# Patient Record
Sex: Male | Born: 1950
Health system: Southern US, Community
[De-identification: ages and names within clinical notes are randomized; demographics above are authoritative.]

## PROBLEM LIST (undated history)

## (undated) DIAGNOSIS — N200 Calculus of kidney: Secondary | ICD-10-CM

## (undated) DIAGNOSIS — J986 Disorders of diaphragm: Secondary | ICD-10-CM

## (undated) DIAGNOSIS — M1711 Unilateral primary osteoarthritis, right knee: Secondary | ICD-10-CM

## (undated) DIAGNOSIS — Z923 Personal history of irradiation: Secondary | ICD-10-CM

## (undated) DIAGNOSIS — M109 Gout, unspecified: Secondary | ICD-10-CM

## (undated) DIAGNOSIS — K219 Gastro-esophageal reflux disease without esophagitis: Secondary | ICD-10-CM

## (undated) DIAGNOSIS — M1990 Unspecified osteoarthritis, unspecified site: Secondary | ICD-10-CM

## (undated) DIAGNOSIS — E7800 Pure hypercholesterolemia, unspecified: Secondary | ICD-10-CM

## (undated) DIAGNOSIS — G4730 Sleep apnea, unspecified: Secondary | ICD-10-CM

## (undated) DIAGNOSIS — C801 Malignant (primary) neoplasm, unspecified: Secondary | ICD-10-CM

## (undated) DIAGNOSIS — I10 Essential (primary) hypertension: Secondary | ICD-10-CM

## (undated) HISTORY — PX: LARYNGECTOMY: SUR815

## (undated) HISTORY — PX: HERNIA REPAIR: SHX51

## (undated) HISTORY — PX: TONSILLECTOMY: SUR1361

## (undated) HISTORY — PX: UVULOPALATOPHARYNGOPLASTY (UPPP)/TONSILLECTOMY/SEPTOPLASTY: SHX6164

## (undated) HISTORY — PX: KIDNEY STONE SURGERY: SHX686

---

## 2002-12-06 ENCOUNTER — Emergency Department (HOSPITAL_COMMUNITY): Admission: EM | Admit: 2002-12-06 | Discharge: 2002-12-06 | Payer: Self-pay

## 2009-08-02 ENCOUNTER — Emergency Department (HOSPITAL_BASED_OUTPATIENT_CLINIC_OR_DEPARTMENT_OTHER): Admission: EM | Admit: 2009-08-02 | Discharge: 2009-08-03 | Payer: Self-pay | Admitting: Emergency Medicine

## 2009-08-02 ENCOUNTER — Ambulatory Visit: Payer: Self-pay | Admitting: Diagnostic Radiology

## 2009-10-26 ENCOUNTER — Encounter: Admission: RE | Admit: 2009-10-26 | Discharge: 2009-10-26 | Payer: Self-pay | Admitting: *Deleted

## 2009-11-02 ENCOUNTER — Ambulatory Visit (HOSPITAL_COMMUNITY): Admission: RE | Admit: 2009-11-02 | Discharge: 2009-11-02 | Payer: Self-pay | Admitting: Urology

## 2010-04-09 ENCOUNTER — Encounter: Admission: RE | Admit: 2010-04-09 | Discharge: 2010-04-09 | Payer: Self-pay | Admitting: Internal Medicine

## 2010-11-02 ENCOUNTER — Ambulatory Visit
Admission: RE | Admit: 2010-11-02 | Discharge: 2010-11-02 | Payer: Self-pay | Source: Home / Self Care | Attending: Urology | Admitting: Urology

## 2011-01-28 LAB — POCT I-STAT, CHEM 8
HCT: 50 % (ref 39.0–52.0)
Hemoglobin: 17 g/dL (ref 13.0–17.0)
Potassium: 3.9 mEq/L (ref 3.5–5.1)
Sodium: 139 mEq/L (ref 135–145)
TCO2: 26 mmol/L (ref 0–100)

## 2011-02-22 LAB — DIFFERENTIAL
Basophils Absolute: 0.1 10*3/uL (ref 0.0–0.1)
Basophils Relative: 1 % (ref 0–1)
Eosinophils Absolute: 0 K/uL (ref 0.0–0.7)
Eosinophils Relative: 0 % (ref 0–5)
Lymphocytes Relative: 11 % — ABNORMAL LOW (ref 12–46)
Lymphs Abs: 1.5 10*3/uL (ref 0.7–4.0)
Monocytes Absolute: 1.2 10*3/uL — ABNORMAL HIGH (ref 0.1–1.0)
Monocytes Relative: 9 % (ref 3–12)
Neutro Abs: 10.7 10*3/uL — ABNORMAL HIGH (ref 1.7–7.7)
Neutrophils Relative %: 78 % — ABNORMAL HIGH (ref 43–77)

## 2011-02-22 LAB — COMPREHENSIVE METABOLIC PANEL WITH GFR
ALT: 39 U/L (ref 0–53)
AST: 44 U/L — ABNORMAL HIGH (ref 0–37)
Alkaline Phosphatase: 69 U/L (ref 39–117)
CO2: 28 meq/L (ref 19–32)
GFR calc Af Amer: >60 mL/min (ref >60)
GFR calc non Af Amer: >60 mL/min (ref >60)
Glucose, Bld: 118 mg/dL — ABNORMAL HIGH (ref 70–99)
Potassium: 3.9 meq/L (ref 3.5–5.1)
Sodium: 142 meq/L (ref 135–145)
Total Protein: 8.4 g/dL — ABNORMAL HIGH (ref 6.0–8.3)

## 2011-02-22 LAB — URINALYSIS, ROUTINE W REFLEX MICROSCOPIC
Bilirubin Urine: NEGATIVE
Glucose, UA: NEGATIVE mg/dL
Ketones, ur: NEGATIVE mg/dL
Nitrite: NEGATIVE
Protein, ur: NEGATIVE mg/dL
Specific Gravity, Urine: 1.016 (ref 1.005–1.030)
Urobilinogen, UA: 0.2 mg/dL (ref 0.0–1.0)
pH: 6 (ref 5.0–8.0)

## 2011-02-22 LAB — URINE MICROSCOPIC-ADD ON

## 2011-02-22 LAB — URINE CULTURE
Colony Count: NO GROWTH
Culture: NO GROWTH

## 2011-02-22 LAB — CBC
HCT: 48.9 % (ref 39.0–52.0)
Hemoglobin: 16.6 g/dL (ref 13.0–17.0)
MCHC: 34 g/dL (ref 30.0–36.0)
MCV: 90.9 fL (ref 78.0–100.0)
Platelets: 230 10*3/uL (ref 150–400)
RBC: 5.37 MIL/uL (ref 4.22–5.81)
RDW: 12.6 % (ref 11.5–15.5)
WBC: 13.5 10*3/uL — ABNORMAL HIGH (ref 4.0–10.5)

## 2011-02-22 LAB — COMPREHENSIVE METABOLIC PANEL
Albumin: 4.7 g/dL (ref 3.5–5.2)
BUN: 21 mg/dL (ref 6–23)
Calcium: 10 mg/dL (ref 8.4–10.5)
Chloride: 102 mEq/L (ref 96–112)
Creatinine, Ser: 1.2 mg/dL (ref 0.4–1.5)
Total Bilirubin: 0.8 mg/dL (ref 0.3–1.2)

## 2011-02-22 LAB — LIPASE, BLOOD: Lipase: 173 U/L (ref 23–300)

## 2012-07-31 ENCOUNTER — Other Ambulatory Visit: Payer: Self-pay | Admitting: Neurosurgery

## 2012-07-31 DIAGNOSIS — M47817 Spondylosis without myelopathy or radiculopathy, lumbosacral region: Secondary | ICD-10-CM

## 2012-07-31 DIAGNOSIS — IMO0002 Reserved for concepts with insufficient information to code with codable children: Secondary | ICD-10-CM

## 2012-07-31 DIAGNOSIS — M5126 Other intervertebral disc displacement, lumbar region: Secondary | ICD-10-CM

## 2012-08-10 ENCOUNTER — Ambulatory Visit
Admission: RE | Admit: 2012-08-10 | Discharge: 2012-08-10 | Disposition: A | Discharge status: Home / Self Care | Payer: 59 | Source: Ambulatory Visit | Attending: Neurosurgery | Admitting: Neurosurgery

## 2012-08-10 DIAGNOSIS — M47817 Spondylosis without myelopathy or radiculopathy, lumbosacral region: Secondary | ICD-10-CM

## 2012-08-10 DIAGNOSIS — M5126 Other intervertebral disc displacement, lumbar region: Secondary | ICD-10-CM

## 2012-08-10 DIAGNOSIS — IMO0002 Reserved for concepts with insufficient information to code with codable children: Secondary | ICD-10-CM

## 2017-04-21 ENCOUNTER — Encounter (HOSPITAL_BASED_OUTPATIENT_CLINIC_OR_DEPARTMENT_OTHER)
Admission: RE | Admit: 2017-04-21 | Discharge: 2017-04-21 | Disposition: A | Discharge status: Home / Self Care | Payer: Medicare Other | Source: Ambulatory Visit | Attending: Otolaryngology | Admitting: Otolaryngology

## 2017-04-21 ENCOUNTER — Encounter (HOSPITAL_BASED_OUTPATIENT_CLINIC_OR_DEPARTMENT_OTHER): Payer: Self-pay | Admitting: *Deleted

## 2017-04-21 DIAGNOSIS — Z01812 Encounter for preprocedural laboratory examination: Secondary | ICD-10-CM | POA: Diagnosis present

## 2017-04-21 DIAGNOSIS — R001 Bradycardia, unspecified: Secondary | ICD-10-CM | POA: Insufficient documentation

## 2017-04-21 DIAGNOSIS — J383 Other diseases of vocal cords: Secondary | ICD-10-CM | POA: Insufficient documentation

## 2017-04-21 DIAGNOSIS — Z01810 Encounter for preprocedural cardiovascular examination: Secondary | ICD-10-CM | POA: Insufficient documentation

## 2017-04-21 DIAGNOSIS — G4733 Obstructive sleep apnea (adult) (pediatric): Secondary | ICD-10-CM | POA: Insufficient documentation

## 2017-04-21 DIAGNOSIS — I10 Essential (primary) hypertension: Secondary | ICD-10-CM | POA: Diagnosis not present

## 2017-04-28 ENCOUNTER — Ambulatory Visit (HOSPITAL_BASED_OUTPATIENT_CLINIC_OR_DEPARTMENT_OTHER): Payer: Medicare Other | Admitting: Anesthesiology

## 2017-04-28 ENCOUNTER — Encounter (HOSPITAL_BASED_OUTPATIENT_CLINIC_OR_DEPARTMENT_OTHER): Payer: Self-pay | Admitting: Anesthesiology

## 2017-04-28 ENCOUNTER — Encounter (HOSPITAL_BASED_OUTPATIENT_CLINIC_OR_DEPARTMENT_OTHER)
Admission: RE | Disposition: A | Discharge status: Home / Self Care | Payer: Self-pay | Source: Ambulatory Visit | Attending: Otolaryngology

## 2017-04-28 ENCOUNTER — Ambulatory Visit (HOSPITAL_BASED_OUTPATIENT_CLINIC_OR_DEPARTMENT_OTHER)
Admission: RE | Admit: 2017-04-28 | Discharge: 2017-04-28 | Disposition: A | Discharge status: Home / Self Care | Payer: Medicare Other | Source: Ambulatory Visit | Attending: Otolaryngology | Admitting: Otolaryngology

## 2017-04-28 DIAGNOSIS — I10 Essential (primary) hypertension: Secondary | ICD-10-CM | POA: Insufficient documentation

## 2017-04-28 DIAGNOSIS — Z87442 Personal history of urinary calculi: Secondary | ICD-10-CM | POA: Insufficient documentation

## 2017-04-28 DIAGNOSIS — Z9989 Dependence on other enabling machines and devices: Secondary | ICD-10-CM | POA: Insufficient documentation

## 2017-04-28 DIAGNOSIS — J383 Other diseases of vocal cords: Secondary | ICD-10-CM | POA: Diagnosis present

## 2017-04-28 DIAGNOSIS — K219 Gastro-esophageal reflux disease without esophagitis: Secondary | ICD-10-CM | POA: Insufficient documentation

## 2017-04-28 DIAGNOSIS — C320 Malignant neoplasm of glottis: Secondary | ICD-10-CM | POA: Insufficient documentation

## 2017-04-28 DIAGNOSIS — G4730 Sleep apnea, unspecified: Secondary | ICD-10-CM | POA: Insufficient documentation

## 2017-04-28 DIAGNOSIS — Z87891 Personal history of nicotine dependence: Secondary | ICD-10-CM | POA: Diagnosis not present

## 2017-04-28 DIAGNOSIS — E7800 Pure hypercholesterolemia, unspecified: Secondary | ICD-10-CM | POA: Insufficient documentation

## 2017-04-28 DIAGNOSIS — Z79899 Other long term (current) drug therapy: Secondary | ICD-10-CM | POA: Insufficient documentation

## 2017-04-28 DIAGNOSIS — M109 Gout, unspecified: Secondary | ICD-10-CM | POA: Insufficient documentation

## 2017-04-28 HISTORY — DX: Unspecified osteoarthritis, unspecified site: M19.90

## 2017-04-28 HISTORY — DX: Pure hypercholesterolemia, unspecified: E78.00

## 2017-04-28 HISTORY — DX: Essential (primary) hypertension: I10

## 2017-04-28 HISTORY — DX: Gout, unspecified: M10.9

## 2017-04-28 HISTORY — DX: Gastro-esophageal reflux disease without esophagitis: K21.9

## 2017-04-28 HISTORY — DX: Calculus of kidney: N20.0

## 2017-04-28 HISTORY — DX: Sleep apnea, unspecified: G47.30

## 2017-04-28 HISTORY — PX: MICROLARYNGOSCOPY WITH CO2 LASER AND EXCISION OF VOCAL CORD LESION: SHX5970

## 2017-04-28 SURGERY — MICROLARYNGOSCOPY WITH CO2 LASER AND EXCISION OF VOCAL CORD LESION
Anesthesia: General | Site: Throat | Laterality: Left

## 2017-04-28 MED ORDER — EPINEPHRINE PF 1 MG/ML IJ SOLN
INTRAMUSCULAR | Status: DC | PRN
  Administered 2017-04-28: 2 mL

## 2017-04-28 MED ORDER — ONDANSETRON HCL 4 MG/2ML IJ SOLN
INTRAMUSCULAR | Status: DC | PRN
  Administered 2017-04-28: 4 mg via INTRAVENOUS

## 2017-04-28 MED ORDER — FENTANYL CITRATE (PF) 100 MCG/2ML IJ SOLN
25.0000 ug | INTRAMUSCULAR | Status: DC | PRN
  Administered 2017-04-28: 50 ug via INTRAVENOUS

## 2017-04-28 MED ORDER — SUGAMMADEX SODIUM 200 MG/2ML IV SOLN
INTRAVENOUS | Status: DC | PRN
  Administered 2017-04-28: 200 mg via INTRAVENOUS

## 2017-04-28 MED ORDER — PROPOFOL 10 MG/ML IV BOLUS
INTRAVENOUS | Status: AC
  Filled 2017-04-28: qty 20

## 2017-04-28 MED ORDER — FENTANYL CITRATE (PF) 100 MCG/2ML IJ SOLN
INTRAMUSCULAR | Status: AC
Start: 2017-04-28 — End: 2017-04-28
  Filled 2017-04-28: qty 2

## 2017-04-28 MED ORDER — FENTANYL CITRATE (PF) 100 MCG/2ML IJ SOLN
50.0000 ug | INTRAMUSCULAR | Status: DC | PRN
  Administered 2017-04-28: 100 ug via INTRAVENOUS

## 2017-04-28 MED ORDER — LACTATED RINGERS IV SOLN
INTRAVENOUS | Status: DC
  Administered 2017-04-28 (×2): via INTRAVENOUS

## 2017-04-28 MED ORDER — OXYCODONE HCL 5 MG PO TABS: 5.0000 mg | ORAL_TABLET | Freq: Once | ORAL | Status: DC | PRN

## 2017-04-28 MED ORDER — PROPOFOL 10 MG/ML IV BOLUS
INTRAVENOUS | Status: DC | PRN
Start: 2017-04-28 — End: 2017-04-28
  Administered 2017-04-28: 180 mg via INTRAVENOUS
  Administered 2017-04-28: 20 mg via INTRAVENOUS

## 2017-04-28 MED ORDER — ONDANSETRON HCL 4 MG/2ML IJ SOLN: 4.0000 mg | Freq: Four times a day (QID) | INTRAMUSCULAR | Status: DC | PRN

## 2017-04-28 MED ORDER — LIDOCAINE HCL (CARDIAC) 20 MG/ML IV SOLN
INTRAVENOUS | Status: DC | PRN
  Administered 2017-04-28: 100 mg via INTRAVENOUS

## 2017-04-28 MED ORDER — MIDAZOLAM HCL 2 MG/2ML IJ SOLN
INTRAMUSCULAR | Status: AC
  Filled 2017-04-28: qty 2

## 2017-04-28 MED ORDER — FENTANYL CITRATE (PF) 100 MCG/2ML IJ SOLN
INTRAMUSCULAR | Status: AC
  Filled 2017-04-28: qty 2

## 2017-04-28 MED ORDER — MIDAZOLAM HCL 2 MG/2ML IJ SOLN
1.0000 mg | INTRAMUSCULAR | Status: DC | PRN
  Administered 2017-04-28: 2 mg via INTRAVENOUS

## 2017-04-28 MED ORDER — OXYCODONE HCL 5 MG/5ML PO SOLN: 5.0000 mg | Freq: Once | ORAL | Status: DC | PRN

## 2017-04-28 MED ORDER — SCOPOLAMINE 1 MG/3DAYS TD PT72: 1.0000 | MEDICATED_PATCH | Freq: Once | TRANSDERMAL | Status: DC | PRN

## 2017-04-28 MED ORDER — DEXAMETHASONE SODIUM PHOSPHATE 4 MG/ML IJ SOLN
INTRAMUSCULAR | Status: DC | PRN
  Administered 2017-04-28: 10 mg via INTRAVENOUS

## 2017-04-28 MED ORDER — ROCURONIUM BROMIDE 100 MG/10ML IV SOLN
INTRAVENOUS | Status: DC | PRN
  Administered 2017-04-28: 30 mg via INTRAVENOUS

## 2017-04-28 MED ORDER — LIDOCAINE 2% (20 MG/ML) 5 ML SYRINGE
INTRAMUSCULAR | Status: AC
  Filled 2017-04-28: qty 5

## 2017-04-28 SURGICAL SUPPLY — 26 items
CANISTER SUCT 1200ML W/VALVE (MISCELLANEOUS) ×2 IMPLANT
GAUZE SPONGE 4X4 12PLY STRL LF (GAUZE/BANDAGES/DRESSINGS) ×4 IMPLANT
GLOVE BIO SURGEON STRL SZ 6.5 (GLOVE) ×1 IMPLANT
GLOVE ECLIPSE 7.5 STRL STRAW (GLOVE) ×2 IMPLANT
GOWN STRL REUS W/ TWL LRG LVL3 (GOWN DISPOSABLE) IMPLANT
GOWN STRL REUS W/ TWL XL LVL3 (GOWN DISPOSABLE) IMPLANT
GOWN STRL REUS W/TWL LRG LVL3 (GOWN DISPOSABLE) ×2
GOWN STRL REUS W/TWL XL LVL3 (GOWN DISPOSABLE) ×2
GUARD TEETH (MISCELLANEOUS) IMPLANT
MARKER SKIN DUAL TIP RULER LAB (MISCELLANEOUS) IMPLANT
NDL HYPO 18GX1.5 BLUNT FILL (NEEDLE) ×1 IMPLANT
NDL SPNL 22GX7 QUINCKE BK (NEEDLE) IMPLANT
NEEDLE HYPO 18GX1.5 BLUNT FILL (NEEDLE) ×2 IMPLANT
NEEDLE SPNL 22GX7 QUINCKE BK (NEEDLE) IMPLANT
NS IRRIG 1000ML POUR BTL (IV SOLUTION) ×2 IMPLANT
PATTIES SURGICAL .5 X3 (DISPOSABLE) ×2 IMPLANT
REDUCTION FITTING 1/4 IN (FILTER) IMPLANT
SHEET MEDIUM DRAPE 40X70 STRL (DRAPES) ×2 IMPLANT
SLEEVE SCD COMPRESS KNEE MED (MISCELLANEOUS) IMPLANT
SOLUTION BUTLER CLEAR DIP (MISCELLANEOUS) ×2 IMPLANT
SURGILUBE 2OZ TUBE FLIPTOP (MISCELLANEOUS) IMPLANT
SYR 5ML LL (SYRINGE) ×2 IMPLANT
SYR CONTROL 10ML LL (SYRINGE) ×1 IMPLANT
SYR TB 1ML LL NO SAFETY (SYRINGE) IMPLANT
TOWEL OR 17X24 6PK STRL BLUE (TOWEL DISPOSABLE) ×2 IMPLANT
TUBE CONNECTING 20X1/4 (TUBING) ×3 IMPLANT

## 2017-04-28 NOTE — Transfer of Care (Signed)
Immediate Anesthesia Transfer of Care Note  Patient: Alexander Ramirez  Procedure(s) Performed: Procedure(s): MICROLARYNGOSCOPT WITH EXCISION LEFT VOCAL CORD MASS WITH CO2 LASER (Left)  Patient Location: PACU  Anesthesia Type:General  Level of Consciousness: awake, alert  and oriented  Airway & Oxygen Therapy: Patient Spontanous Breathing and Patient connected to face mask oxygen  Post-op Assessment: Report given to RN and Post -op Vital signs reviewed and stable  Post vital signs: Reviewed and stable  Last Vitals:  Vitals:   04/28/17 0911 04/28/17 1036  BP: 131/67   Pulse: 62 69  Resp: 18 17  Temp: 36.4 C (P) 36.7 C    Last Pain:  Vitals:   04/28/17 0911  TempSrc: Oral         Complications: No apparent anesthesia complications

## 2017-04-28 NOTE — H&P (Signed)
Alexander Ramirez is an 66 y.o. male.   Chief Complaint: hoarse HPI: several months hoarseness  Past Medical History:  Diagnosis Date  . Arthritis   . GERD (gastroesophageal reflux disease)    OTC meds  . Gout   . Hypercholesteremia   . Hypertension   . Kidney stones   . Sleep apnea    uses CPAP nightly    Past Surgical History:  Procedure Laterality Date  . HERNIA REPAIR    . KIDNEY STONE SURGERY    . TONSILLECTOMY    . UVULOPALATOPHARYNGOPLASTY (UPPP)/TONSILLECTOMY/SEPTOPLASTY     for sleep apnea    History reviewed. No pertinent family history. Social History:  reports that he has quit smoking. He has never used smokeless tobacco. He reports that he drinks alcohol. He reports that he does not use drugs.  Allergies: No Known Allergies  Medications Prior to Admission  Medication Sig Dispense Refill  . allopurinol (ZYLOPRIM) 300 MG tablet Take 300 mg by mouth 2 (two) times daily.    Marland Kitchen atorvastatin (LIPITOR) 40 MG tablet Take 40 mg by mouth daily.    . carvedilol (COREG) 6.25 MG tablet Take 6.25 mg by mouth 2 (two) times daily with a meal.    . meloxicam (MOBIC) 15 MG tablet Take 15 mg by mouth daily.    Marland Kitchen telmisartan (MICARDIS) 80 MG tablet Take 80 mg by mouth daily.      No results found for this or any previous visit (from the past 48 hour(s)). No results found.  ROS: otherwise negative  Height 5' 5.5" (1.664 m), weight 104.3 kg (230 lb).  PHYSICAL EXAM: Overall appearance:  Healthy appearing, in no distress, hoarse voice. Head:  Normocephalic, atraumatic. Ears: External auditory canals are clear; tympanic membranes are intact and the middle ears are free of any effusion. Nose: External nose is healthy in appearance. Internal nasal exam free of any lesions or obstruction. Oral Cavity/pharynx:  There are no mucosal lesions or masses identified. Hypopharynx/Larynx: Left cord lesion. Vocal cords move normally. Neuro:  No identifiable neurologic deficits. Neck: No  palpable neck masses.  Studies Reviewed: none    Assessment/Plan Vocal cord lesion. MicroDL with biopsy.  Alexander Ramirez 04/28/2017, 9:04 AM

## 2017-04-28 NOTE — Anesthesia Postprocedure Evaluation (Signed)
Anesthesia Post Note  Patient: Alexander Ramirez  Procedure(s) Performed: Procedure(s) (LRB): MICROLARYNGOSCOPT WITH EXCISION LEFT VOCAL CORD MASS WITH CO2 LASER (Left)     Patient location during evaluation: PACU Anesthesia Type: General Level of consciousness: awake and alert and patient cooperative Pain management: pain level controlled Vital Signs Assessment: post-procedure vital signs reviewed and stable Respiratory status: spontaneous breathing and respiratory function stable Cardiovascular status: stable Anesthetic complications: no    Last Vitals:  Vitals:   04/28/17 1100 04/28/17 1130  BP: 130/68 (!) 147/89  Pulse: (!) 56 (!) 58  Resp: 14 16  Temp:  36.7 C    Last Pain:  Vitals:   04/28/17 1130  TempSrc:   PainSc: Advance

## 2017-04-28 NOTE — Anesthesia Procedure Notes (Signed)
Procedure Name: Intubation Date/Time: 04/28/2017 9:45 AM Performed by: Maryella Shivers Pre-anesthesia Checklist: Patient identified, Emergency Drugs available, Suction available and Patient being monitored Patient Re-evaluated:Patient Re-evaluated prior to inductionOxygen Delivery Method: Circle system utilized Preoxygenation: Pre-oxygenation with 100% oxygen Intubation Type: IV induction Ventilation: Mask ventilation without difficulty Laryngoscope Size: Mac and 3 Grade View: Grade I Tube type: Oral Laser Tube: Laser Tube and Cuffed inflated with minimal occlusive pressure - saline Tube size: 6.0 (laser) mm Number of attempts: 1 Airway Equipment and Method: Stylet and Oral airway Placement Confirmation: ETT inserted through vocal cords under direct vision,  positive ETCO2 and breath sounds checked- equal and bilateral Tube secured with: Tape Dental Injury: Teeth and Oropharynx as per pre-operative assessment

## 2017-04-28 NOTE — Anesthesia Preprocedure Evaluation (Signed)
Anesthesia Evaluation  Patient identified by MRN, date of birth, ID band Patient awake    Reviewed: Allergy & Precautions, H&P , NPO status , Patient's Chart, lab work & pertinent test results  Airway Mallampati: II   Neck ROM: full    Dental   Pulmonary sleep apnea and Continuous Positive Airway Pressure Ventilation , former smoker,    breath sounds clear to auscultation       Cardiovascular hypertension,  Rhythm:regular Rate:Normal     Neuro/Psych    GI/Hepatic GERD  ,  Endo/Other    Renal/GU stones     Musculoskeletal  (+) Arthritis ,   Abdominal   Peds  Hematology   Anesthesia Other Findings   Reproductive/Obstetrics                             Anesthesia Physical Anesthesia Plan  ASA: II  Anesthesia Plan: General   Post-op Pain Management:    Induction: Intravenous  PONV Risk Score and Plan: 3 and Ondansetron, Dexamethasone, Propofol and Midazolam  Airway Management Planned: Oral ETT  Additional Equipment:   Intra-op Plan:   Post-operative Plan: Extubation in OR  Informed Consent: I have reviewed the patients History and Physical, chart, labs and discussed the procedure including the risks, benefits and alternatives for the proposed anesthesia with the patient or authorized representative who has indicated his/her understanding and acceptance.     Plan Discussed with: CRNA, Anesthesiologist and Surgeon  Anesthesia Plan Comments:         Anesthesia Quick Evaluation

## 2017-04-28 NOTE — Op Note (Signed)
OPERATIVE REPORT  DATE OF SURGERY: 04/28/2017  PATIENT:  Alexander Ramirez,  66 y.o. male  PRE-OPERATIVE DIAGNOSIS:  LEFT VOCAL CORD MASS  POST-OPERATIVE DIAGNOSIS:  LEFT VOCAL CORD MASS  PROCEDURE:  Procedure(s): MICROLARYNGOSCOPT WITH EXCISION LEFT VOCAL CORD MASS WITH CO2 LASER  SURGEON:  Beckie Salts, MD  ASSISTANTS: none  ANESTHESIA:   General   EBL:  3 ml  DRAINS: none  LOCAL MEDICATIONS USED:  None  SPECIMEN:  Left vocal cord mass  COUNTS:  Correct  PROCEDURE DETAILS: The patient was taken to the operating room and placed on the operating table in the supine position. Following induction of general endotracheal anesthesia using a laser safe endotracheal tube, the table was turned 90 and the patient was draped in a standard fashion including saline soaked eye pads and facial towels for laser protection. The Jako laryngoscope was entered into the oral cavity used to visualize the larynx and attached the Mayo stand with the suspension apparatus. The left vocal cord lesion was identified. Photograph was taken. The CO2 laser was attached to the operating microscope. The laser was used at a setting of 4 W continuous power and the lesion was excised in its entirety. Some residual abnormal mucosa was vaporized with the laser. The specimen was sent for pathologic evaluation. Topical adrenaline was used on pledgets for hemostasis. A maxillary tooth protector was used throughout the case. The scope was removed. The patient was awakened extubated and transferred to recovery in stable condition.    PATIENT DISPOSITION:  To PACU, stable

## 2017-04-28 NOTE — Discharge Instructions (Signed)
We will call you with results of the biopsy later in the week when available.  Post Anesthesia Home Care Instructions  Activity: Get plenty of rest for the remainder of the day. A responsible individual must stay with you for 24 hours following the procedure.  For the next 24 hours, DO NOT: -Drive a car -Paediatric nurse -Drink alcoholic beverages -Take any medication unless instructed by your physician -Make any legal decisions or sign important papers.  Meals: Start with liquid foods such as gelatin or soup. Progress to regular foods as tolerated. Avoid greasy, spicy, heavy foods. If nausea and/or vomiting occur, drink only clear liquids until the nausea and/or vomiting subsides. Call your physician if vomiting continues.  Special Instructions/Symptoms: Your throat may feel dry or sore from the anesthesia or the breathing tube placed in your throat during surgery. If this causes discomfort, gargle with warm salt water. The discomfort should disappear within 24 hours.  If you had a scopolamine patch placed behind your ear for the management of post- operative nausea and/or vomiting:  1. The medication in the patch is effective for 72 hours, after which it should be removed.  Wrap patch in a tissue and discard in the trash. Wash hands thoroughly with soap and water. 2. You may remove the patch earlier than 72 hours if you experience unpleasant side effects which may include dry mouth, dizziness or visual disturbances. 3. Avoid touching the patch. Wash your hands with soap and water after contact with the patch.

## 2017-04-29 ENCOUNTER — Encounter (HOSPITAL_BASED_OUTPATIENT_CLINIC_OR_DEPARTMENT_OTHER): Payer: Self-pay | Admitting: Otolaryngology

## 2017-05-01 ENCOUNTER — Encounter (HOSPITAL_BASED_OUTPATIENT_CLINIC_OR_DEPARTMENT_OTHER): Payer: Self-pay | Admitting: Otolaryngology

## 2017-05-01 ENCOUNTER — Encounter: Payer: Self-pay | Admitting: Radiation Oncology

## 2017-05-02 NOTE — Progress Notes (Signed)
error 

## 2017-05-05 ENCOUNTER — Ambulatory Visit
Admission: RE | Admit: 2017-05-05 | Discharge: 2017-05-05 | Disposition: A | Discharge status: Home / Self Care | Payer: Medicare Other | Source: Ambulatory Visit | Attending: Radiation Oncology | Admitting: Radiation Oncology

## 2017-05-05 ENCOUNTER — Ambulatory Visit: Admission: RE | Admit: 2017-05-05 | Payer: Medicare Other | Source: Ambulatory Visit

## 2017-05-05 DIAGNOSIS — E7800 Pure hypercholesterolemia, unspecified: Secondary | ICD-10-CM | POA: Insufficient documentation

## 2017-05-05 DIAGNOSIS — K219 Gastro-esophageal reflux disease without esophagitis: Secondary | ICD-10-CM | POA: Insufficient documentation

## 2017-05-05 DIAGNOSIS — Z801 Family history of malignant neoplasm of trachea, bronchus and lung: Secondary | ICD-10-CM | POA: Insufficient documentation

## 2017-05-05 DIAGNOSIS — M109 Gout, unspecified: Secondary | ICD-10-CM | POA: Insufficient documentation

## 2017-05-05 DIAGNOSIS — Z79899 Other long term (current) drug therapy: Secondary | ICD-10-CM | POA: Insufficient documentation

## 2017-05-05 DIAGNOSIS — Z87442 Personal history of urinary calculi: Secondary | ICD-10-CM | POA: Insufficient documentation

## 2017-05-05 DIAGNOSIS — I10 Essential (primary) hypertension: Secondary | ICD-10-CM | POA: Insufficient documentation

## 2017-05-05 DIAGNOSIS — M1990 Unspecified osteoarthritis, unspecified site: Secondary | ICD-10-CM | POA: Insufficient documentation

## 2017-05-05 DIAGNOSIS — C320 Malignant neoplasm of glottis: Secondary | ICD-10-CM | POA: Insufficient documentation

## 2017-05-05 DIAGNOSIS — G4730 Sleep apnea, unspecified: Secondary | ICD-10-CM | POA: Insufficient documentation

## 2017-05-05 DIAGNOSIS — Z87891 Personal history of nicotine dependence: Secondary | ICD-10-CM | POA: Insufficient documentation

## 2017-05-05 DIAGNOSIS — Z510 Encounter for antineoplastic radiation therapy: Secondary | ICD-10-CM | POA: Insufficient documentation

## 2017-05-05 NOTE — Progress Notes (Signed)
Head and Neck Cancer Location of Tumor / Histology:  04/28/17 Diagnosis Vocal cord, biopsy, Left - SQUAMOUS CELL CARCINOMA  Patient presented with symptoms of: He reports several months of voice hoarseness.   Biopsies of Left vocal cord revealed: Squamous Cell Carcinoma  Nutrition Status Yes No Comments  Weight changes? []  [x]    Swallowing concerns? []  [x]    PEG? []  [x]     Referrals Yes No Comments  Social Work? []  [x]    Dentistry? []  [x]  Last visit 5 years,no ins  Swallowing therapy? []  [x]    Nutrition? []  [x]    Med/Onc? []  [x]     Safety Issues Yes No Comments  Prior radiation? []  [x]    Pacemaker/ICD? []  [x]    Possible current pregnancy? []  [x]    Is the patient on methotrexate? []  [x]     Tobacco/Marijuana/Snuff/ETOH use: He is a former smoker, no smokeless tobacco use. He drinks alcohol socially.   Past/Anticipated interventions by otolaryngology, if any:  04/28/17 PROCEDURE:  Procedure(s): MICROLARYNGOSCOPT WITH EXCISION LEFT VOCAL CORD MASS WITH CO2 LASER  SURGEON:  Beckie Salts, MD, 05/16/17  Follow up  Past/Anticipated interventions by medical oncology, if any: No   Current Complaints / other details:  Hoarseness, Married,  Father lung cancer, smoker, died age 74,  BP 136/81   Pulse (!) 57   Temp 98.2 F (36.8 C) (Oral)   Resp 18   Ht 5' 5.5" (1.664 m)   Wt 233 lb 9.6 oz (106 kg)   SpO2 99% Comment: room air  BMI 38.28 kg/m   Wt Readings from Last 3 Encounters:  05/06/17 233 lb 9.6 oz (106 kg)  04/28/17 233 lb (105.7 kg)

## 2017-05-06 ENCOUNTER — Ambulatory Visit
Admission: RE | Admit: 2017-05-06 | Discharge: 2017-05-06 | Disposition: A | Discharge status: Home / Self Care | Payer: Medicare Other | Source: Ambulatory Visit | Attending: Radiation Oncology | Admitting: Radiation Oncology

## 2017-05-06 ENCOUNTER — Encounter: Payer: Self-pay | Admitting: Radiation Oncology

## 2017-05-06 VITALS — BP 136/81 | HR 57 | Temp 98.2°F | Resp 18 | Ht 65.5 in | Wt 233.6 lb

## 2017-05-06 DIAGNOSIS — C320 Malignant neoplasm of glottis: Secondary | ICD-10-CM

## 2017-05-06 DIAGNOSIS — Z801 Family history of malignant neoplasm of trachea, bronchus and lung: Secondary | ICD-10-CM | POA: Diagnosis not present

## 2017-05-06 DIAGNOSIS — Z87442 Personal history of urinary calculi: Secondary | ICD-10-CM | POA: Diagnosis not present

## 2017-05-06 DIAGNOSIS — Z79899 Other long term (current) drug therapy: Secondary | ICD-10-CM | POA: Diagnosis not present

## 2017-05-06 DIAGNOSIS — M1990 Unspecified osteoarthritis, unspecified site: Secondary | ICD-10-CM | POA: Diagnosis not present

## 2017-05-06 DIAGNOSIS — E7800 Pure hypercholesterolemia, unspecified: Secondary | ICD-10-CM | POA: Diagnosis not present

## 2017-05-06 DIAGNOSIS — K219 Gastro-esophageal reflux disease without esophagitis: Secondary | ICD-10-CM | POA: Diagnosis not present

## 2017-05-06 DIAGNOSIS — Z87891 Personal history of nicotine dependence: Secondary | ICD-10-CM | POA: Diagnosis not present

## 2017-05-06 DIAGNOSIS — M109 Gout, unspecified: Secondary | ICD-10-CM | POA: Diagnosis not present

## 2017-05-06 DIAGNOSIS — Z510 Encounter for antineoplastic radiation therapy: Secondary | ICD-10-CM | POA: Diagnosis not present

## 2017-05-06 DIAGNOSIS — I10 Essential (primary) hypertension: Secondary | ICD-10-CM | POA: Diagnosis not present

## 2017-05-06 DIAGNOSIS — G4730 Sleep apnea, unspecified: Secondary | ICD-10-CM | POA: Diagnosis not present

## 2017-05-06 MED ORDER — LARYNGOSCOPY SOLUTION RAD-ONC
15.0000 mL | Freq: Once | TOPICAL | Status: AC
  Administered 2017-05-06: 15 mL via TOPICAL
  Filled 2017-05-06: qty 15

## 2017-05-06 NOTE — Progress Notes (Signed)
Please see the Nurse Progress Note in the MD Initial Consult Encounter for this patient. 

## 2017-05-06 NOTE — Progress Notes (Addendum)
Radiation Oncology         [phone] ________________________________  Initial Outpatient Consultation  Name: Alexander Ramirez MRN: [id number]  Date: 05/06/2017  DOB: 01-02-1951  WG:[id number], [name]  Izora Gala, MD   REFERRING PHYSICIAN : Izora Gala, MD  DIAGNOSIS: Cancer Staging Malignant neoplasm of glottis [facility]) Staging form: Larynx - Glottis, AJCC 8th Edition - Clinical: Stage I (cT1a, cN0, cM0) - Signed by Eppie Gibson, MD on 05/07/2017     ICD-10-CM   1. 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 History of tobacco abuse Z87.891 CT CHEST LUNG CA SCREEN LOW DOSE W/O CM  3. Malignant neoplasm of glottis (HCC) C32.0     CHIEF COMPLAINT: Here to discuss management of squamous cell carcinoma of the vocal cords  HISTORY OF PRESENT ILLNESS::Alexander Ramirez is a 66 y.o. male who initially presented with a several month history of hoarseness. He therefore saw Dr. Constance Holster, ENT. A left vocal cord lesion was identified on laryngoscopy. The patient then underwent microlaryngoscopy with excision of the left vocal cord mass on 04/28/17 by Dr. Constance Holster. This revealed squamous cell carcinoma and the features in the biopsy were highly suspicious for invasion.  The patient reports to the clinic today, with his wife, to discuss the role that radiation therapy may play in the treatment of his disease.  On review of systems:  He reports arthritic joint pain in his hands, knees, and back.  Of note, the patient is a former smoker of 40 years who smoked two-thirds of a pack per day (at least 30 pack year history). He quit in approximately 2010. He reports mostly cigar use but inhaled. He denies history of smokeless tobacco use. He drinks alcohol socially. 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 His last visit with the dentist  was over 5 years ago.  PREVIOUS RADIATION THERAPY: No  PAST MEDICAL HISTORY:  has a past medical history of Arthritis; GERD (gastroesophageal reflux disease); Gout; Hypercholesteremia; Hypertension; Kidney stones; and Sleep apnea.    PAST SURGICAL HISTORY: Past Surgical History:  Procedure Laterality Date  . HERNIA REPAIR    . KIDNEY STONE SURGERY    . MICROLARYNGOSCOPY WITH CO2 LASER AND EXCISION OF VOCAL CORD LESION Left 04/28/2017   Procedure: MICROLARYNGOSCOPY WITH EXCISION LEFT VOCAL CORD MASS WITH CO2 LASER;  Surgeon: Izora Gala, MD;  Location: [facility];  Service: ENT;  Laterality: Left;  . TONSILLECTOMY    . UVULOPALATOPHARYNGOPLASTY (UPPP)/TONSILLECTOMY/SEPTOPLASTY     for sleep apnea    FAMILY HISTORY: family history includes Lung cancer in his father.  SOCIAL HISTORY:  reports that he has quit smoking. He has never used smokeless tobacco. He reports that he drinks alcohol. He reports that he does not use drugs.  ALLERGIES: Patient has no known allergies.  MEDICATIONS:  Current Outpatient Prescriptions  Medication Sig Dispense Refill  . allopurinol (ZYLOPRIM) 300 MG tablet Take 300 mg by mouth 2 (two) times daily.    Marland Kitchen atorvastatin (LIPITOR) 40 MG tablet Take 40 mg by mouth daily.    . carvedilol (COREG) 6.25 MG tablet Take 6.25 mg by mouth 2 (two) times daily with a meal.    . telmisartan (MICARDIS) 80 MG tablet Take 80 mg by mouth daily.    . meloxicam (MOBIC) 15 MG tablet Take  15 mg by mouth daily.     No current facility-administered medications for this encounter.     REVIEW OF SYSTEMS:  A 10+ POINT REVIEW OF SYSTEMS WAS OBTAINED including neurology, dermatology, psychiatry, cardiac, respiratory, lymph, extremities, GI, GU, Musculoskeletal, constitutional, HEENT.  All pertinent positives are noted in the HPI.  All others are negative.    PHYSICAL EXAM:  height is 5' 5.5" (1.664 m) and weight is 233 lb 9.6 oz (106 kg). His oral temperature is 98.2  F (36.8 C). His blood pressure is 136/81 and his pulse is 57 (abnormal). His respiration is 18 and oxygen saturation is 99%.   General: Alert and oriented, in no acute distress HEENT: Head is normocephalic. Extraocular movements are intact. Oropharynx is clear. He is missing his uvula. Hoarse  Neck: Neck is notable for no palpable mass in the supraclavicular or cervical areas. Heart: Regular in rate and rhythm with no murmurs, rubs, or gallops. Chest: Clear to auscultation bilaterally, with no rhonchi, wheezes, or rales. Abdomen: Soft, nontender, nondistended, with no rigidity or guarding. Extremities: No cyanosis or edema. Lymphatics: see Neck Exam Skin: No concerning lesions. Musculoskeletal: symmetric strength and muscle tone throughout. Neurologic: Cranial nerves II through XII are grossly intact. No obvious focalities. Speech is fluent. Coordination is intact. Psychiatric: Judgment and insight are intact. Affect is appropriate.  PROCEDURE NOTE: After obtaining consent and anesthetizing the nasal cavity with topical lidocaine and phenylephrine, the flexible endoscope was introduced and passed through the nasal cavity.  The true cords have a white patchy irregular surface which may be secondary to his recent laser excision. There is some asymmetry of the ventricular folds. The left ventricular fold appears more swollen and the left aryepiglottic fold appears to be possibly less mobile than the right. The movement of the true vocal cords is obscured by movement of the supraglottic structures.  ECOG = 0   LABORATORY DATA:  Lab Results  Component Value Date   WBC 13.5 (H) 08/02/2009   HGB 17.0 11/02/2010   HCT 50.0 11/02/2010   MCV 90.9 08/02/2009   PLT 230 08/02/2009   CMP     Component Value Date/Time   NA 139 11/02/2010 1104   K 3.9 11/02/2010 1104   CL 103 11/02/2010 1104   CO2 28 08/02/2009 2227   GLUCOSE 94 11/02/2010 1104   BUN 15 11/02/2010 1104   CREATININE 1.1  11/02/2010 1104   CALCIUM 10.0 08/02/2009 2227   PROT 8.4 (H) 08/02/2009 2227   ALBUMIN 4.7 08/02/2009 2227   AST 44 (H) 08/02/2009 2227   ALT 39 08/02/2009 2227   ALKPHOS 69 08/02/2009 2227   BILITOT 0.8 08/02/2009 2227   GFRNONAA >60 08/02/2009 2227   GFRAA  08/02/2009 2227    >60        The eGFR has been calculated using the MDRD equation. This calculation has not been validated in all clinical situations. eGFR's persistently <60 mL/min signify possible Chronic Kidney Disease.         RADIOGRAPHY: No results found.    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 I would recommend radiotherapy for this patient.  I spoke with Dr. Constance Holster prior to the patient's encounter. He feels that that he has possibly not removed all of the disease with laser excision and that the patient is at a high risk of recurrence if the patient is only observed. Dr. Constance Holster  is heavily leaning towards radiation for a curative treatment. I echoed this to the patient and also acknowledged that Dr. Constance Holster is willing to undergo close observation with the patient if he declines radiation. We will also discuss the patient's case as a team at tumor board tomorrow. At tumor board I will discuss the asymmetry of the supraglottis. There appears to be swelling of the left ventricular fold and possibly some decreased mobility of the aryepiglottic fold. I am not sure if Dr. Constance Holster appreciated this on a previous exam. Out of caution, I will ask Dr. Constance Holster to see the patient back in his office for a final laryngoscopy before we establish the patient's stage.  We discussed the potential risks, benefits, and side effects of radiotherapy. We talked in detail about acute and late effects. We discussed that some of the most bothersome acute effects may be mucositis, dysgeusia, salivary changes, skin irritation, hair loss, dehydration, weight  loss and fatigue. We talked about late effects which include but are not necessarily limited to dysphagia, hypothyroidism, nerve injury, laryngeal injury, feeding tube dependence. No guarantees of treatment were given. A consent form was signed and placed in the patient's medical record. The patient is enthusiastic about proceeding with treatment. I look forward to participating in the patient's care.    Simulation (treatment planning) will take place after he undergoes CT chest as above:  The patient underwent counseling and shared decision making concerning LDCT screening of the chest concerning lung cancer. He meets the criteria for this and is enthusiastic to proceed with imaging. He quit smoking 7-8 years ago and has at least 30 pack year history. The patient will have this performed at [facility].  We also discussed that the treatment of head and neck cancer is a multidisciplinary process to maximize treatment outcomes and quality of life. For this reasons the following referrals have been or will be made:  Nutritionist for nutrition support during and after treatment.  Speech language pathology for swallowing and/or speech therapy.  Social work for social support.   Physical therapy due to risk of lymphedema in neck and deconditioning.  Baseline labs including TSH.  ADDENDUM: Dr. Constance Holster and I spoke - the area of supraglottic swelling is adjacent to the area of laser excision. This is likely inflammation but Dr. Constance Holster will see the patient and assess soon. __________________________________________   Eppie Gibson, MD  This document serves as a record of services personally performed by Eppie Gibson, MD. It was created on her behalf by Darcus Austin, a trained medical scribe. The creation of this record is based on the scribe's personal observations and the provider's statements to them. This document has been checked and approved by the attending provider.

## 2017-05-07 ENCOUNTER — Telehealth: Payer: Self-pay | Admitting: *Deleted

## 2017-05-07 ENCOUNTER — Other Ambulatory Visit: Payer: Self-pay | Admitting: Radiation Oncology

## 2017-05-07 DIAGNOSIS — C320 Malignant neoplasm of glottis: Secondary | ICD-10-CM | POA: Insufficient documentation

## 2017-05-07 DIAGNOSIS — Z1329 Encounter for screening for other suspected endocrine disorder: Secondary | ICD-10-CM

## 2017-05-07 NOTE — Telephone Encounter (Signed)
CALLED PATIENT TO INFORM OF CT FOR 05-13-17- ARRIVAL TIME - 3:15 PM @ [facility], NO RESTRICTIONS TO TEST, LVM FOR A RETURN CALL

## 2017-05-08 ENCOUNTER — Encounter: Payer: Self-pay | Admitting: *Deleted

## 2017-05-08 ENCOUNTER — Telehealth: Payer: Self-pay | Admitting: *Deleted

## 2017-05-08 NOTE — Telephone Encounter (Signed)
Oncology Nurse Navigator Documentation  Placed introductory call to new referral patient.  Introduced myself as the H&N oncology nurse navigator that works with Dr. Isidore Moos with whom he met on Tuesday.   I briefly explained my role as his navigator, indicated that I would be joining him as he moves forward with treatment.   I discussed next Tuesday morning's H&N MDC, he indicated availability to attend.  I provided him an 0800 arrival to Radiation Waiting, explained registration procedure.   I provided my contact information, encouraged him to call with questions/concerns before next week.  He verbalized understanding of information provided, expressed appreciation for my call.  Gayleen Orem, RN, BSN, [place] [facility] Oncology Nurse [place] [facility] [place] [phone]

## 2017-05-09 NOTE — Progress Notes (Signed)
A user error has taken place: encounter opened in error, closed for administrative reasons.

## 2017-05-12 ENCOUNTER — Telehealth: Payer: Self-pay | Admitting: *Deleted

## 2017-05-12 NOTE — Telephone Encounter (Signed)
Oncology Nurse Navigator Documentation  LVMM on mobile phones for both Mr. and Mrs. Lampert reminding them of tomorrow morning's H&N [place] with an 0800 arrival to Radiation Waiting following lobby registration.  Gayleen Orem, RN, BSN, [place] [facility] Oncology Nurse [place] [facility] [place] [phone]

## 2017-05-13 ENCOUNTER — Ambulatory Visit: Payer: Medicare Other | Admitting: Nutrition

## 2017-05-13 ENCOUNTER — Encounter: Payer: Self-pay | Admitting: *Deleted

## 2017-05-13 ENCOUNTER — Ambulatory Visit (HOSPITAL_COMMUNITY)
Admission: RE | Admit: 2017-05-13 | Discharge: 2017-05-13 | Disposition: A | Discharge status: Home / Self Care | Payer: Medicare Other | Source: Ambulatory Visit | Attending: Radiation Oncology | Admitting: Radiation Oncology

## 2017-05-13 ENCOUNTER — Ambulatory Visit: Payer: Medicare Other | Attending: Radiation Oncology | Admitting: Physical Therapy

## 2017-05-13 ENCOUNTER — Ambulatory Visit
Admission: RE | Admit: 2017-05-13 | Discharge: 2017-05-13 | Disposition: A | Discharge status: Home / Self Care | Payer: Medicare Other | Source: Ambulatory Visit | Attending: Radiation Oncology | Admitting: Radiation Oncology

## 2017-05-13 VITALS — BP 126/79 | HR 56 | Temp 98.1°F | Wt 237.6 lb

## 2017-05-13 DIAGNOSIS — C320 Malignant neoplasm of glottis: Secondary | ICD-10-CM

## 2017-05-13 DIAGNOSIS — Z122 Encounter for screening for malignant neoplasm of respiratory organs: Secondary | ICD-10-CM | POA: Insufficient documentation

## 2017-05-13 DIAGNOSIS — K760 Fatty (change of) liver, not elsewhere classified: Secondary | ICD-10-CM | POA: Diagnosis not present

## 2017-05-13 DIAGNOSIS — I2510 Atherosclerotic heart disease of native coronary artery without angina pectoris: Secondary | ICD-10-CM | POA: Insufficient documentation

## 2017-05-13 DIAGNOSIS — R262 Difficulty in walking, not elsewhere classified: Secondary | ICD-10-CM | POA: Insufficient documentation

## 2017-05-13 DIAGNOSIS — J439 Emphysema, unspecified: Secondary | ICD-10-CM | POA: Diagnosis not present

## 2017-05-13 DIAGNOSIS — I700 Atherosclerosis of aorta: Secondary | ICD-10-CM | POA: Diagnosis not present

## 2017-05-13 DIAGNOSIS — R293 Abnormal posture: Secondary | ICD-10-CM | POA: Diagnosis not present

## 2017-05-13 DIAGNOSIS — M47814 Spondylosis without myelopathy or radiculopathy, thoracic region: Secondary | ICD-10-CM | POA: Diagnosis not present

## 2017-05-13 DIAGNOSIS — Z87891 Personal history of nicotine dependence: Secondary | ICD-10-CM | POA: Insufficient documentation

## 2017-05-13 NOTE — Therapy (Signed)
[place], [place], [address] Phone: [phone]   Fax:  [phone]  Physical Therapy Evaluation  Patient Details  Name: Alexander Ramirez MRN: [id number] Date of Birth: January 18, 1951 Referring Provider: Dr. Eppie Gibson  Encounter Date: 05/13/2017      PT End of Session - 05/13/17 1149    Visit Number 1   Number of Visits 1   PT Start Time 0920   PT Stop Time 0948   PT Time Calculation (min) 28 min   Activity Tolerance Patient tolerated treatment well   Behavior During Therapy [facility] for tasks assessed/performed      Past Medical History:  Diagnosis Date  . Arthritis   . GERD (gastroesophageal reflux disease)    OTC meds  . Gout   . Hypercholesteremia   . Hypertension   . Kidney stones   . Sleep apnea    uses CPAP nightly    Past Surgical History:  Procedure Laterality Date  . HERNIA REPAIR    . KIDNEY STONE SURGERY    . MICROLARYNGOSCOPY WITH CO2 LASER AND EXCISION OF VOCAL CORD LESION Left 04/28/2017   Procedure: MICROLARYNGOSCOPY WITH EXCISION LEFT VOCAL CORD MASS WITH CO2 LASER;  Surgeon: Izora Gala, MD;  Location: [facility];  Service: ENT;  Laterality: Left;  . TONSILLECTOMY    . UVULOPALATOPHARYNGOPLASTY (UPPP)/TONSILLECTOMY/SEPTOPLASTY     for sleep apnea    There were no vitals filed for this visit.       Subjective Assessment - 05/13/17 1100    Subjective Reports a bad back and two bad knees.  Has a prescription for therapy for his back and has had knee injections; back therapy on hold until after cancer treatment.  Orthopedist suggests total knee replacements for his knees.   Patient is accompained by: Family member  wife   Pertinent History From Dr. Pearlie Oyster note: "Alexander Ramirez is a 66 y.o. male who initially presented with a several month history of hoarseness. He therefore saw Dr. Constance Holster, ENT. A left vocal cord lesion was identified on laryngoscopy. The  patient then underwent microlaryngoscopy with excision of the left vocal cord mass on 04/28/17 by Dr. Constance Holster. This revealed squamous cell carcinoma and the features in the biopsy were highly suspicious for invasion" He is expected to start XRT 7/12 or 06/02/17. Will have chest CT today (05/13/17) to rule out metastases.   How long can you stand comfortably? 5-10 minutes   Patient Stated Goals get info from all head & neck clinic providers   Currently in Pain? Yes   Pain Score 3    Pain Location Back   Pain Orientation Right;Lower   Pain Descriptors / Indicators Aching   Pain Type Chronic pain   Pain Radiating Towards right leg to ankle   Pain Onset More than a month ago   Aggravating Factors  standing for more than a few minutes   Pain Relieving Factors steroid dosepak helped, but just temporarily            [facility] PT Assessment - 05/13/17 0001      Assessment   Medical Diagnosis left vocal cord squamous cell carcinoma   Referring Provider Dr. Eppie Gibson   Prior Therapy none  but has a referral for PT for back pain     Precautions   Precautions Other (comment)   Precaution Comments cancer precautions     Restrictions   Weight Bearing Restrictions No     Balance Screen  Has the patient fallen in the past 6 months Yes   How many times? 1  fluke-carrying 2 grocery bags, missed step   Has the patient had a decrease in activity level because of a fear of falling?  No   Is the patient reluctant to leave their home because of a fear of falling?  No     Home Ecologist residence   Living Arrangements Spouse/significant other   Type of [place] level     Prior Function   Level of Independence Independent   Leisure no regular exercise     Cognition   Overall Cognitive Status Within Functional Limits for tasks assessed     Observation/Other Assessments   Observations gentleman in no acute distress; voice is raspy      Coordination   Gross Motor Movements are Fluid and Coordinated Yes     Functional Tests   Functional tests Sit to Stand     Sit to Stand   Comments 14 times in 30 seconds, good for his age     Posture/Postural Control   Posture/Postural Control Postural limitations   Postural Limitations Forward head     ROM / Strength   AROM / PROM / Strength AROM     AROM   Overall AROM Comments neck and shoulder AROM both WFL     Ambulation/Gait   Ambulation/Gait Yes   Ambulation/Gait Assistance 6: Modified independent (Device/Increase time)   Assistive device None  but uses cart at grocery store to lean on   Gait Pattern Antalgic  left knee pain; significant limp   Ambulation Surface Level   Stairs --  reports he needs to use railing on stairs           LYMPHEDEMA/ONCOLOGY QUESTIONNAIRE - 05/13/17 1232      Type   Cancer Type left vocal cord squamous cell     Lymphedema Assessments   Lymphedema Assessments Head and Neck     Head and Neck   4 cm superior to sternal notch around neck 42.2 cm   6 cm superior to sternal notch around neck 43.4 cm   8 cm superior to sternal notch around neck 44.7 cm         Objective measurements completed on examination: See above findings.                  PT Education - 05/13/17 1148    Education provided Yes   Education Details neck AROM, posture, breathing, CURE article on staying active, options/alternatives for doing cardiovascular exercise including pool and arm bike, info on [facility] at the [employer], lymphedema and PT info   Person(s) Educated Patient;Spouse   Methods Explanation;Handout   Comprehension Verbalized understanding                 Head and Neck Clinic Goals - 05/13/17 1154      Patient will be able to verbalize understanding of a home exercise program for cervical range of motion, posture, and walking.    Status Achieved  but alternatives to walking were suggested     Patient will  be able to verbalize understanding of proper sitting and standing posture.    Status Achieved     Patient will be able to verbalize understanding of lymphedema risk and availability of treatment for this condition.    Status Achieved            Plan -  05/13/17 1149    Clinical Impression Statement This is a pleasant gentleman with diagnosis of left vocal cord squamous cell carcinoma who will undergo XRT treatment starting in mid-July.  He has back pain and bilateral knee pain with a significant antalgic limp and needs to use a railing on stairs. He has forward head posture.   History and Personal Factors relevant to plan of care: chronic knee and back problems   Clinical Presentation Evolving   Clinical Presentation due to: will start radiation treatment soon   Clinical Decision Making Moderate   Rehab Potential Good   PT Frequency One time visit   PT Treatment/Interventions Patient/family education   PT Next Visit Plan None at this time.  He may need therapy going forward should lymphedema develop.  He has a prescription for therapy for his back and has knee problems as well, but these things are on hold for him until after his cancer treatment.   PT Home Exercise Plan try to come up with a pool exercise program or other exercise program that won't irritate his knees and back, neck ROM; [facility]   Consulted and Agree with Plan of Care Patient      Patient will benefit from skilled therapeutic intervention in order to improve the following deficits and impairments:  Postural dysfunction, Pain, Abnormal gait  Visit Diagnosis: Abnormal posture - Plan: PT plan of care cert/re-cert  Malignant neoplasm of left vocal cord ([place]) - Plan: PT plan of care cert/re-cert  Difficulty in walking, not elsewhere classified - Plan: PT plan of care cert/re-cert      G-Codes - 44/62/86 1155    Functional Assessment Tool Used (Outpatient Only)  clinical judgement   Functional Limitation Mobility: Walking and moving around   Mobility: Walking and Moving Around Current Status (N8177) At least 40 percent but less than 60 percent impaired, limited or restricted   Mobility: Walking and Moving Around Goal Status [phone]) At least 40 percent but less than 60 percent impaired, limited or restricted   Mobility: Walking and Moving Around Discharge Status [phone]) At least 40 percent but less than 60 percent impaired, limited or restricted       Problem List Patient Active Problem List   Diagnosis Date Noted  . Malignant neoplasm of glottis ([place]) 05/07/2017    Deone Leifheit 05/13/2017, 12:33 PM  [place], [place], [address] Phone: [phone]   Fax:  [phone]  Name: Haddon Fyfe MRN: [id number] Date of Birth: 1951/03/08  Serafina Royals, PT 05/13/17 12:33 PM

## 2017-05-13 NOTE — Progress Notes (Signed)
Oncology Nurse Navigator Documentation  Met with Mr. Costales during [facility].  He was accompanied by his wife.  Provided verbal and written overview of [place], the clinicians who will be seeing him, encouraged him to ask questions during his time with them.  He was seen by Nutrition, PT, SW and [place].  He has SLP appt scheduled for 05/19/17.  Spoke with him at end of [facility], addressed questions. They understand I can be contacted with needs/concerns.  Gayleen Orem, RN, BSN, [place] at [place] [phone]

## 2017-05-13 NOTE — Progress Notes (Signed)
Head & Neck Multidisciplinary Clinic Clinical Social Work  Clinical Social Work met with patient/family at head & neck multidisciplinary clinic to offer support and assess for psychosocial needs.  Patient was accompanied by his spouse.  He has 6 children (all live within an hour) and 15 grandchildren.  Mr. 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  Mr. Moss discussed his plan to continue working through treatment.  CSW discussed strategies for managing to remain active while also resting as needed based on treatment side effects.  Mr. Burlingame expressed situational anxiety surrounding the uncertainty related to treatment and it's side effects.  Clinical Social Work briefly discussed Clinical Social Work role and [employer] support programs/services.  Clinical Social Work encouraged patient to call with any additional questions or concerns.   Maryjean Morn, MSW, LCSW, OSW-C Clinical Social Worker [facility] [phone]

## 2017-05-13 NOTE — Progress Notes (Signed)
Patient was seen during head and neck clinic.  Patient is a 66 year old male diagnosed with glottis cancer to receive radiation therapy.  Past medical history includes tobacco, arthritis, GERD, gout, hypercholesterolemia, hypertension, kidney stones, and social alcohol usage.  Medications include Lipitor.  Labs were reviewed.  Height: 65.5 inches. Weight: 237.6 pounds Usual body weight: 235 pounds. BMI: 38.94.  Patient denies nutrition impact symptoms.  Nutrition diagnosis:  Food and nutrition related knowledge deficit related to new diagnosis of Glottis cancer as evidenced by no prior need for nutrition related information.  Intervention: Patient was educated to consume high-calorie, high-protein foods in small frequent meals and snacks. Reviewed importance of increased fluids. Reviewed potential side effects and provided nutrition interventions. Provided fact sheets and answered questions.  Teach back method was used and contact information given.  Monitoring, evaluation, goals: Patient will tolerate adequate calories and protein to minimize weight loss throughout treatment to promote healing.  Next visit: To be scheduled.  **Disclaimer: This note was dictated with voice recognition software. Similar sounding words can inadvertently be transcribed and this note may contain transcription errors which may not have been corrected upon publication of note.**

## 2017-05-14 ENCOUNTER — Telehealth: Payer: Self-pay | Admitting: *Deleted

## 2017-05-14 NOTE — Telephone Encounter (Signed)
Oncology Nurse Navigator Documentation  Called patient wife in follow-up to her VMM this morning. Per Dr. Isidore Moos, informed her of favorable results for yesterday's CT Chest. She voiced appreciation for information. Confirmed name of Mr. Packard PCP, entered into Care Team. Confirmed their understanding of Friday's 0900 CT SIM, requested 0845 arrival to Radiation Waiting following lobby registration.  Gayleen Orem, RN, BSN, [place]ck Oncology Nurse [place] at [place] [phone]

## 2017-05-16 ENCOUNTER — Encounter: Payer: Self-pay | Admitting: *Deleted

## 2017-05-16 ENCOUNTER — Ambulatory Visit
Admission: RE | Admit: 2017-05-16 | Discharge: 2017-05-16 | Disposition: A | Discharge status: Home / Self Care | Payer: Medicare Other | Source: Ambulatory Visit | Attending: Radiation Oncology | Admitting: Radiation Oncology

## 2017-05-16 DIAGNOSIS — C320 Malignant neoplasm of glottis: Secondary | ICD-10-CM

## 2017-05-16 DIAGNOSIS — Z510 Encounter for antineoplastic radiation therapy: Secondary | ICD-10-CM | POA: Diagnosis not present

## 2017-05-16 NOTE — Progress Notes (Signed)
Oncology Nurse Navigator Documentation  To provide support, encouragement and care continuity, met with Mr. Alexander Ramirez during his CT SIM.  He was accompanied by his wife. He tolerated procedure without difficulty, denied questions/concerns. I showed them LINAC 2 treatment area, explained arrival and treatment preparation procedures. They confirmed understanding of 7/2 1530 SLP at [facility]. They understand to call me with questions/concerns prior to 7/9 RT start.  Gayleen Orem, RN, BSN, [place] Neck Oncology Nurse [place] at [place] [phone]

## 2017-05-17 NOTE — Progress Notes (Signed)
  Radiation Oncology         [phone]) [phone] ________________________________  Name: Alexander Ramirez MRN: [id number]  Date: 05/16/2017  DOB: 09-07-1951  SIMULATION AND TREATMENT PLANNING NOTE  Outpatient    ICD-10-CM   1. Malignant neoplasm of glottis ([place]) C32.0     NARRATIVE:  The patient was brought to the [place].  Identity was confirmed.  All relevant records and images related to the planned course of therapy were reviewed.  The patient freely provided informed written consent to proceed with treatment after reviewing the details related to the planned course of therapy. The consent form was witnessed and verified by the simulation staff.    Then, the patient was set-up in a stable reproducible supine position for radiation therapy.  Aquaplast head and should mask was custom fabricated for immobilization.  CT images were obtained without contrast.  Surface markings were placed.  The CT images were loaded into the planning software.    TREATMENT PLANNING NOTE: Treatment planning then occurred.  The radiation prescription was entered and confirmed.    A total of 3 medically necessary complex treatment devices were fabricated and supervised by me (2 wedges for the opposed fields and the Aquaplast head and shoulder mask). I have requested : 3D Simulation  I have requested a DVH of the following structures: target volume, pharynx, esophagus, spinal cord.  I have ordered:Nutrition Consult  The patient will receive 63 Gy in 28 fractions to the larynx with 2 fields.  -----------------------------------  Eppie Gibson, MD

## 2017-05-19 ENCOUNTER — Ambulatory Visit: Payer: Medicare Other | Attending: Radiation Oncology

## 2017-05-19 DIAGNOSIS — Z510 Encounter for antineoplastic radiation therapy: Secondary | ICD-10-CM | POA: Diagnosis not present

## 2017-05-19 DIAGNOSIS — R1310 Dysphagia, unspecified: Secondary | ICD-10-CM | POA: Diagnosis not present

## 2017-05-19 NOTE — Patient Instructions (Signed)
SWALLOWING EXERCISES Do these as prescribed until 6 months after your last day of radiation, then 2 times per week afterwards  1. Effortful Swallows - Press your tongue against the roof of your mouth for 3 seconds, then squeeze          the muscles in your neck while you swallow your saliva or a sip of water - Repeat 20 times, 2-3 times a day, and use whenever you eat or drink  2. Pitch Raise - Repeat "he", once per second in as high of a pitch as you can - Repeat 20 times, 2-3 times a day  3. Mendelsohn Maneuver - "half swallow" exercise - Start to swallow, and keep your Adam's apple up by squeezing hard with the            muscles of the throat - Hold the squeeze for 5-7 seconds and then relax - Repeat 20 times, 2-3 times a day *use a wet spoon if your mouth gets dry*  4. Breath Hold - Say "HUH!" loudly, then hold your breath for 3 seconds at your voice box - Repeat 20 times, 2-3 times a day  5. Chin pushback - Open your mouth  - Place your fist UNDER your chin near your neck, and push back with your fist for 5 seconds - Repeat 10 times, 2-3 times a day        6.  "Siren" exercise             - Start at a low note and sing up to a high note, then back down again to a low note             - Repeat 10 times

## 2017-05-19 NOTE — Therapy (Addendum)
[facility] [address] [place], [place], [address] Phone: [phone]   Fax:  [phone]  Speech Language Pathology Evaluation  Patient Details  Name: Alexander Ramirez MRN: [id number] Date of Birth: [birth year] Referring Provider: Eppie Gibson, MD  Encounter Date: 05/19/2017      End of Session - 05/19/17 1634    Visit Number 1   Number of Visits 3   Date for SLP Re-Evaluation 08/08/17   SLP Start Time 1520   SLP Stop Time  1275   SLP Time Calculation (min) 45 min   Activity Tolerance Patient tolerated treatment well      Past Medical History:  Diagnosis Date  . Arthritis   . GERD (gastroesophageal reflux disease)    OTC meds  . Gout   . Hypercholesteremia   . Hypertension   . Kidney stones   . Sleep apnea    uses CPAP nightly    Past Surgical History:  Procedure Laterality Date  . HERNIA REPAIR    . KIDNEY STONE SURGERY    . MICROLARYNGOSCOPY WITH CO2 LASER AND EXCISION OF VOCAL CORD LESION Left 04/28/2017   Procedure: MICROLARYNGOSCOPY WITH EXCISION LEFT VOCAL CORD MASS WITH CO2 LASER;  Surgeon: Izora Gala, MD;  Location: [facility];  Service: ENT;  Laterality: Left;  . TONSILLECTOMY    . UVULOPALATOPHARYNGOPLASTY (UPPP)/TONSILLECTOMY/SEPTOPLASTY     for sleep apnea    There were no vitals filed for this visit.      Subjective Assessment - 05/19/17 1517    Subjective Pt endorses difficulty with meats, tougher foods. Has had esophageal dilation in past. Notably mod hoarse voice today.   Patient is accompained by: Family member  Almyra Free, wife   Currently in Pain? Yes   Pain Score 3    Pain Location Back   Pain Orientation Right;Lower   Pain Descriptors / Indicators Aching   Pain Type Chronic pain   Pain Onset More than a month ago   Aggravating Factors  standing   Pain Relieving Factors meds            SLP Evaluation [facility] - 05/19/17 1517      SLP Visit Information    SLP Received On 05/19/17   Referring Provider Eppie Gibson, MD   Onset Date Four months ago   Medical Diagnosis Glottic cancer     General Information   HPI Has had esophageal dilation approx 15 years ago. Reports diffculty with dense meats (steak, pork). Rad begins 05-26-17, pt reports approx 6 weeks of rad tx planned.      Prior Functional Status   Cognitive/Linguistic Baseline Within functional limits   Vocation Self employed     Cognition   Overall Cognitive Status Within Functional Limits for tasks assessed     Auditory Comprehension   Overall Auditory Comprehension Appears within functional limits for tasks assessed     Verbal Expression   Overall Verbal Expression Appears within functional limits for tasks assessed     Oral Motor/Sensory Function   Overall Oral Motor/Sensory Function Appears within functional limits for tasks assessed     Motor Speech   Overall Motor Speech Appears within functional limits for tasks assessed   Phonation Hoarse  mod hoarse; sustained /a/=10 seconds (below WNL)                         SLP Education - 05/19/17 1633    Education provided Yes  Education Details HEP,  late effects head/neck radiation   Person(s) Educated Patient;Spouse   Methods Explanation;Demonstration;Verbal cues;Handout   Comprehension Verbalized understanding;Returned demonstration;Verbal cues required;Need further instruction          SLP Short Term Goals - June 18, 2017 1636      SLP SHORT TERM GOAL #1   Title pt will complete HEP with rare min A    Time 1   Period --  visit   Status New     SLP SHORT TERM GOAL #2   Title pt will tell SLP why he is completing HEP   Time 1   Period --  visit   Status New          SLP Long Term Goals - 2017-06-18 1637      SLP LONG TERM GOAL #1   Title pt will complete HEP with modified independecne   Time 2   Period --  visits   Status New     SLP LONG TERM GOAL #2   Title pt will tell SLP 3  overt s/s aspiration PNA with modified independence   Time 2   Period --  visits   Status New     SLP LONG TERM GOAL #3   Title pt will tell SLP why a food journal is helpful in returning to most liberal diet   Time 2   Period --  visits   Status New          Plan - 18-Jun-2017 1634    Clinical Impression Statement Pt with oropharyngeal swallowing essentially WFL, however reports occasional difficulty wiht pharyngeal clearance with dense meats. His probability of swallowing difficulty increases dramatically with the initiation of radiation therapy. Pt will need to be followed by SLP for regular assessment of accurate HEP completion as well as for safety with POs both during and following treatment/s.   Speech Therapy Frequency --  approx once every 4 weeks   Duration --  two therapy visits   Treatment/Interventions Pharyngeal strengthening exercises;Aspiration precaution training;Diet toleration management by SLP;Compensatory techniques;Internal/external aids;SLP instruction and feedback;Patient/family education;Oral motor exercises;Trials of upgraded texture/liquids  any or all may be used   Potential to Achieve Goals Good   SLP Home Exercise Plan provided today   Consulted and Agree with Plan of Care Patient      Patient will benefit from skilled therapeutic intervention in order to improve the following deficits and impairments:   Dysphagia, unspecified type      G-Codes - 2017-06-18 1638    Functional Assessment Tool Used clinical judgement, pt report   Functional Limitations Swallowing   Swallow Current Status (X1062) At least 1 percent but less than 20 percent impaired, limited or restricted   Swallow Goal Status (I9485) At least 1 percent but less than 20 percent impaired, limited or restricted      Problem List Patient Active Problem List   Diagnosis Date Noted  . Malignant neoplasm of glottis ([place]) 05/07/2017    [facility], [place]  06/18/2017, 4:39  PM  [facility] [address] [place], [place], [address] Phone: [phone]   Fax:  [phone]  Name: Alexander Ramirez MRN: [id number] Date of Birth: 04/24/1951

## 2017-05-26 ENCOUNTER — Ambulatory Visit
Admission: RE | Admit: 2017-05-26 | Discharge: 2017-05-26 | Disposition: A | Discharge status: Home / Self Care | Payer: Medicare Other | Source: Ambulatory Visit | Attending: Radiation Oncology | Admitting: Radiation Oncology

## 2017-05-26 ENCOUNTER — Other Ambulatory Visit: Payer: Self-pay | Admitting: Radiation Oncology

## 2017-05-26 DIAGNOSIS — Z510 Encounter for antineoplastic radiation therapy: Secondary | ICD-10-CM | POA: Diagnosis not present

## 2017-05-26 DIAGNOSIS — C320 Malignant neoplasm of glottis: Secondary | ICD-10-CM

## 2017-05-26 MED ORDER — LIDOCAINE VISCOUS 2 % MT SOLN: OROMUCOSAL | 5 refills | Status: DC

## 2017-05-26 MED ORDER — BIAFINE EX EMUL
Freq: Two times a day (BID) | CUTANEOUS | Status: DC
  Administered 2017-05-26: 16:00:00 via TOPICAL

## 2017-05-26 NOTE — Progress Notes (Signed)
Pt here for patient teaching.  Pt given Radiation and You booklet, Managing Acute Radiation Side Effects for Head and Neck Cancer handout and Sonafine.  Reviewed areas of pertinence such as fatigue, hair loss, mouth changes, skin changes, throat changes, earaches and taste changes . Pt able to give teach back of to pat skin and drink plenty of water,apply Sonafine bid and to use an electric razor if they must shave. Pt demonstrated understanding and verbalizes understanding of information given and will contact nursing with any questions or concerns.

## 2017-05-27 ENCOUNTER — Ambulatory Visit
Admission: RE | Admit: 2017-05-27 | Discharge: 2017-05-27 | Disposition: A | Discharge status: Home / Self Care | Payer: Medicare Other | Source: Ambulatory Visit | Attending: Radiation Oncology | Admitting: Radiation Oncology

## 2017-05-27 DIAGNOSIS — Z510 Encounter for antineoplastic radiation therapy: Secondary | ICD-10-CM | POA: Diagnosis not present

## 2017-05-28 ENCOUNTER — Ambulatory Visit
Admission: RE | Admit: 2017-05-28 | Discharge: 2017-05-28 | Disposition: A | Discharge status: Home / Self Care | Payer: Medicare Other | Source: Ambulatory Visit | Attending: Radiation Oncology | Admitting: Radiation Oncology

## 2017-05-28 DIAGNOSIS — Z510 Encounter for antineoplastic radiation therapy: Secondary | ICD-10-CM | POA: Diagnosis not present

## 2017-05-29 ENCOUNTER — Ambulatory Visit
Admission: RE | Admit: 2017-05-29 | Discharge: 2017-05-29 | Disposition: A | Discharge status: Home / Self Care | Payer: Medicare Other | Source: Ambulatory Visit | Attending: Radiation Oncology | Admitting: Radiation Oncology

## 2017-05-29 DIAGNOSIS — Z510 Encounter for antineoplastic radiation therapy: Secondary | ICD-10-CM | POA: Diagnosis not present

## 2017-05-30 ENCOUNTER — Ambulatory Visit
Admission: RE | Admit: 2017-05-30 | Discharge: 2017-05-30 | Disposition: A | Discharge status: Home / Self Care | Payer: Medicare Other | Source: Ambulatory Visit | Attending: Radiation Oncology | Admitting: Radiation Oncology

## 2017-05-30 DIAGNOSIS — Z510 Encounter for antineoplastic radiation therapy: Secondary | ICD-10-CM | POA: Diagnosis not present

## 2017-06-02 ENCOUNTER — Ambulatory Visit
Admission: RE | Admit: 2017-06-02 | Discharge: 2017-06-02 | Disposition: A | Discharge status: Home / Self Care | Payer: Medicare Other | Source: Ambulatory Visit | Attending: Radiation Oncology | Admitting: Radiation Oncology

## 2017-06-02 DIAGNOSIS — Z510 Encounter for antineoplastic radiation therapy: Secondary | ICD-10-CM | POA: Diagnosis not present

## 2017-06-03 ENCOUNTER — Ambulatory Visit
Admission: RE | Admit: 2017-06-03 | Discharge: 2017-06-03 | Disposition: A | Discharge status: Home / Self Care | Payer: Medicare Other | Source: Ambulatory Visit | Attending: Radiation Oncology | Admitting: Radiation Oncology

## 2017-06-03 DIAGNOSIS — Z510 Encounter for antineoplastic radiation therapy: Secondary | ICD-10-CM | POA: Diagnosis not present

## 2017-06-03 NOTE — Progress Notes (Signed)
Nutrition  Patient was a no show for nutrition follow-up appointment this am.  Next follow-up planned for 7/27  Dallie Patton B. Zenia Resides, [place], [place] Registered Dietitian [phone] (pager)

## 2017-06-04 ENCOUNTER — Ambulatory Visit
Admission: RE | Admit: 2017-06-04 | Discharge: 2017-06-04 | Disposition: A | Discharge status: Home / Self Care | Payer: Medicare Other | Source: Ambulatory Visit | Attending: Radiation Oncology | Admitting: Radiation Oncology

## 2017-06-04 DIAGNOSIS — Z510 Encounter for antineoplastic radiation therapy: Secondary | ICD-10-CM | POA: Diagnosis not present

## 2017-06-05 ENCOUNTER — Ambulatory Visit
Admission: RE | Admit: 2017-06-05 | Discharge: 2017-06-05 | Disposition: A | Discharge status: Home / Self Care | Payer: Medicare Other | Source: Ambulatory Visit | Attending: Radiation Oncology | Admitting: Radiation Oncology

## 2017-06-05 DIAGNOSIS — Z510 Encounter for antineoplastic radiation therapy: Secondary | ICD-10-CM | POA: Diagnosis not present

## 2017-06-06 ENCOUNTER — Ambulatory Visit
Admission: RE | Admit: 2017-06-06 | Discharge: 2017-06-06 | Disposition: A | Discharge status: Home / Self Care | Payer: Medicare Other | Source: Ambulatory Visit | Attending: Radiation Oncology | Admitting: Radiation Oncology

## 2017-06-06 DIAGNOSIS — Z510 Encounter for antineoplastic radiation therapy: Secondary | ICD-10-CM | POA: Diagnosis not present

## 2017-06-09 ENCOUNTER — Ambulatory Visit
Admission: RE | Admit: 2017-06-09 | Discharge: 2017-06-09 | Disposition: A | Discharge status: Home / Self Care | Payer: Medicare Other | Source: Ambulatory Visit | Attending: Radiation Oncology | Admitting: Radiation Oncology

## 2017-06-09 ENCOUNTER — Other Ambulatory Visit: Payer: Self-pay | Admitting: Radiation Oncology

## 2017-06-09 DIAGNOSIS — C320 Malignant neoplasm of glottis: Secondary | ICD-10-CM

## 2017-06-09 DIAGNOSIS — Z510 Encounter for antineoplastic radiation therapy: Secondary | ICD-10-CM | POA: Diagnosis not present

## 2017-06-09 MED ORDER — SENNA 8.6 MG PO TABS: 1.0000 | ORAL_TABLET | Freq: Every evening | ORAL | 1 refills | Status: DC | PRN

## 2017-06-09 MED ORDER — HYDROCODONE-ACETAMINOPHEN 7.5-325 MG/15ML PO SOLN: 10.0000 mL | ORAL | 0 refills | Status: DC | PRN

## 2017-06-10 ENCOUNTER — Ambulatory Visit
Admission: RE | Admit: 2017-06-10 | Discharge: 2017-06-10 | Disposition: A | Discharge status: Home / Self Care | Payer: Medicare Other | Source: Ambulatory Visit | Attending: Radiation Oncology | Admitting: Radiation Oncology

## 2017-06-10 DIAGNOSIS — Z510 Encounter for antineoplastic radiation therapy: Secondary | ICD-10-CM | POA: Diagnosis not present

## 2017-06-10 MED FILL — HYDROCOD-APAP 7.5-325/15ML: 7.5-325 | 6 days supply | Qty: 500 | Fill #0

## 2017-06-11 ENCOUNTER — Ambulatory Visit
Admission: RE | Admit: 2017-06-11 | Discharge: 2017-06-11 | Disposition: A | Discharge status: Home / Self Care | Payer: Medicare Other | Source: Ambulatory Visit | Attending: Radiation Oncology | Admitting: Radiation Oncology

## 2017-06-11 DIAGNOSIS — Z510 Encounter for antineoplastic radiation therapy: Secondary | ICD-10-CM | POA: Diagnosis not present

## 2017-06-12 ENCOUNTER — Ambulatory Visit
Admission: RE | Admit: 2017-06-12 | Discharge: 2017-06-12 | Disposition: A | Discharge status: Home / Self Care | Payer: Medicare Other | Source: Ambulatory Visit | Attending: Radiation Oncology | Admitting: Radiation Oncology

## 2017-06-12 DIAGNOSIS — Z510 Encounter for antineoplastic radiation therapy: Secondary | ICD-10-CM | POA: Diagnosis not present

## 2017-06-13 ENCOUNTER — Encounter: Payer: Self-pay | Admitting: Nutrition

## 2017-06-13 ENCOUNTER — Ambulatory Visit
Admission: RE | Admit: 2017-06-13 | Discharge: 2017-06-13 | Disposition: A | Discharge status: Home / Self Care | Payer: Medicare Other | Source: Ambulatory Visit | Attending: Radiation Oncology | Admitting: Radiation Oncology

## 2017-06-13 ENCOUNTER — Encounter: Payer: Medicare Other | Admitting: Nutrition

## 2017-06-13 DIAGNOSIS — Z510 Encounter for antineoplastic radiation therapy: Secondary | ICD-10-CM | POA: Diagnosis not present

## 2017-06-13 NOTE — Progress Notes (Signed)
Patient did not show up for nutrition appointment. 

## 2017-06-16 ENCOUNTER — Ambulatory Visit
Admission: RE | Admit: 2017-06-16 | Discharge: 2017-06-16 | Disposition: A | Discharge status: Home / Self Care | Payer: Medicare Other | Source: Ambulatory Visit | Attending: Radiation Oncology | Admitting: Radiation Oncology

## 2017-06-16 ENCOUNTER — Other Ambulatory Visit: Payer: Self-pay | Admitting: Radiation Oncology

## 2017-06-16 DIAGNOSIS — Z510 Encounter for antineoplastic radiation therapy: Secondary | ICD-10-CM | POA: Diagnosis not present

## 2017-06-16 DIAGNOSIS — R918 Other nonspecific abnormal finding of lung field: Secondary | ICD-10-CM

## 2017-06-17 ENCOUNTER — Ambulatory Visit
Admission: RE | Admit: 2017-06-17 | Discharge: 2017-06-17 | Disposition: A | Discharge status: Home / Self Care | Payer: Medicare Other | Source: Ambulatory Visit | Attending: Radiation Oncology | Admitting: Radiation Oncology

## 2017-06-17 DIAGNOSIS — Z510 Encounter for antineoplastic radiation therapy: Secondary | ICD-10-CM | POA: Diagnosis not present

## 2017-06-18 ENCOUNTER — Ambulatory Visit
Admission: RE | Admit: 2017-06-18 | Discharge: 2017-06-18 | Disposition: A | Discharge status: Home / Self Care | Payer: Medicare Other | Source: Ambulatory Visit | Attending: Radiation Oncology | Admitting: Radiation Oncology

## 2017-06-18 DIAGNOSIS — Z510 Encounter for antineoplastic radiation therapy: Secondary | ICD-10-CM | POA: Diagnosis not present

## 2017-06-19 ENCOUNTER — Ambulatory Visit
Admission: RE | Admit: 2017-06-19 | Discharge: 2017-06-19 | Disposition: A | Discharge status: Home / Self Care | Payer: Medicare Other | Source: Ambulatory Visit | Attending: Radiation Oncology | Admitting: Radiation Oncology

## 2017-06-19 ENCOUNTER — Encounter: Payer: Self-pay | Admitting: *Deleted

## 2017-06-19 DIAGNOSIS — Z510 Encounter for antineoplastic radiation therapy: Secondary | ICD-10-CM | POA: Diagnosis not present

## 2017-06-19 NOTE — Progress Notes (Signed)
Oncology Nurse Navigator Documentation  To provide support, encouragement and care continuity, met with Alexander Ramirez and his wife following RT. He reported:  Increasing throat soreness.  Stated he has been using viscous lidocaine, began HYCET last HS before bed, again this morning.  I encouraged use of both to facilitate oral intake, maximize comfort.  Has been eating soft foods without difficulty up until a couple of days ago, able to drink.  He agreed to follow-up with Dory [place], Nutrition; he understands I will notify her to arrange.  Increasing fatigue.  We discussed this is an expected SE from RT, I encouraged activity to maintain stamina.  Xerostomia, especially while sleeping.  I suggested applying Biotene gel or coconut oil to mouth at bedtime for relief, he agreed to try. They understand I can be contacted with needs/concerns.  Gayleen Orem, RN, BSN, [place] Neck Oncology Nurse [place] at [place] [phone]

## 2017-06-20 ENCOUNTER — Ambulatory Visit
Admission: RE | Admit: 2017-06-20 | Discharge: 2017-06-20 | Disposition: A | Discharge status: Home / Self Care | Payer: Medicare Other | Source: Ambulatory Visit | Attending: Radiation Oncology | Admitting: Radiation Oncology

## 2017-06-20 ENCOUNTER — Encounter: Payer: Medicare Other | Admitting: Nutrition

## 2017-06-20 ENCOUNTER — Encounter: Payer: Self-pay | Admitting: Nutrition

## 2017-06-20 DIAGNOSIS — Z510 Encounter for antineoplastic radiation therapy: Secondary | ICD-10-CM | POA: Diagnosis not present

## 2017-06-20 NOTE — Progress Notes (Signed)
Patient did not show up for scheduled nutrition appointment.

## 2017-06-23 ENCOUNTER — Ambulatory Visit: Payer: Medicare Other

## 2017-06-23 ENCOUNTER — Ambulatory Visit
Admission: RE | Admit: 2017-06-23 | Discharge: 2017-06-23 | Disposition: A | Discharge status: Home / Self Care | Payer: Medicare Other | Source: Ambulatory Visit | Attending: Radiation Oncology | Admitting: Radiation Oncology

## 2017-06-23 DIAGNOSIS — Z510 Encounter for antineoplastic radiation therapy: Secondary | ICD-10-CM | POA: Diagnosis not present

## 2017-06-24 ENCOUNTER — Ambulatory Visit
Admission: RE | Admit: 2017-06-24 | Discharge: 2017-06-24 | Disposition: A | Discharge status: Home / Self Care | Payer: Medicare Other | Source: Ambulatory Visit | Attending: Radiation Oncology | Admitting: Radiation Oncology

## 2017-06-24 DIAGNOSIS — Z510 Encounter for antineoplastic radiation therapy: Secondary | ICD-10-CM | POA: Diagnosis not present

## 2017-06-25 ENCOUNTER — Ambulatory Visit
Admission: RE | Admit: 2017-06-25 | Discharge: 2017-06-25 | Disposition: A | Discharge status: Home / Self Care | Payer: Medicare Other | Source: Ambulatory Visit | Attending: Radiation Oncology | Admitting: Radiation Oncology

## 2017-06-25 DIAGNOSIS — Z510 Encounter for antineoplastic radiation therapy: Secondary | ICD-10-CM | POA: Diagnosis not present

## 2017-06-26 ENCOUNTER — Ambulatory Visit
Admission: RE | Admit: 2017-06-26 | Discharge: 2017-06-26 | Disposition: A | Discharge status: Home / Self Care | Payer: Medicare Other | Source: Ambulatory Visit | Attending: Radiation Oncology | Admitting: Radiation Oncology

## 2017-06-26 DIAGNOSIS — Z510 Encounter for antineoplastic radiation therapy: Secondary | ICD-10-CM | POA: Diagnosis not present

## 2017-06-27 ENCOUNTER — Encounter: Payer: Medicare Other | Admitting: Nutrition

## 2017-06-27 ENCOUNTER — Ambulatory Visit
Admission: RE | Admit: 2017-06-27 | Discharge: 2017-06-27 | Disposition: A | Discharge status: Home / Self Care | Payer: Medicare Other | Source: Ambulatory Visit | Attending: Radiation Oncology | Admitting: Radiation Oncology

## 2017-06-27 DIAGNOSIS — Z510 Encounter for antineoplastic radiation therapy: Secondary | ICD-10-CM | POA: Diagnosis not present

## 2017-06-30 ENCOUNTER — Other Ambulatory Visit: Payer: Self-pay | Admitting: Radiation Oncology

## 2017-06-30 ENCOUNTER — Ambulatory Visit
Admission: RE | Admit: 2017-06-30 | Discharge: 2017-06-30 | Disposition: A | Discharge status: Home / Self Care | Payer: Medicare Other | Source: Ambulatory Visit | Attending: Radiation Oncology | Admitting: Radiation Oncology

## 2017-06-30 DIAGNOSIS — C320 Malignant neoplasm of glottis: Secondary | ICD-10-CM

## 2017-06-30 DIAGNOSIS — Z510 Encounter for antineoplastic radiation therapy: Secondary | ICD-10-CM | POA: Diagnosis not present

## 2017-06-30 MED ORDER — SILVER SULFADIAZINE 1 % EX CREA
TOPICAL_CREAM | Freq: Two times a day (BID) | CUTANEOUS | Status: DC
  Administered 2017-06-30: 10:00:00 via TOPICAL

## 2017-06-30 MED ORDER — HYDROCODONE-ACETAMINOPHEN 7.5-325 MG/15ML PO SOLN: 10.0000 mL | ORAL | 0 refills | Status: DC | PRN

## 2017-07-01 ENCOUNTER — Ambulatory Visit
Admission: RE | Admit: 2017-07-01 | Discharge: 2017-07-01 | Disposition: A | Discharge status: Home / Self Care | Payer: Medicare Other | Source: Ambulatory Visit | Attending: Radiation Oncology | Admitting: Radiation Oncology

## 2017-07-01 DIAGNOSIS — Z510 Encounter for antineoplastic radiation therapy: Secondary | ICD-10-CM | POA: Diagnosis not present

## 2017-07-02 ENCOUNTER — Encounter: Payer: Medicare Other | Admitting: Nutrition

## 2017-07-02 ENCOUNTER — Ambulatory Visit
Admission: RE | Admit: 2017-07-02 | Discharge: 2017-07-02 | Disposition: A | Discharge status: Home / Self Care | Payer: Medicare Other | Source: Ambulatory Visit | Attending: Radiation Oncology | Admitting: Radiation Oncology

## 2017-07-02 DIAGNOSIS — Z510 Encounter for antineoplastic radiation therapy: Secondary | ICD-10-CM | POA: Diagnosis not present

## 2017-07-14 ENCOUNTER — Telehealth: Payer: Self-pay | Admitting: *Deleted

## 2017-07-14 NOTE — Progress Notes (Addendum)
Alexander Ramirez presents for follow up of radiation completed 07/02/17.    Pain issues, if any: He denies pain.  Using a feeding tube?: N/A Weight changes, if any: He reports weighing 214.0 lb at his lowest at home after completing radiation. He has gained weight since that time.  07/16/17 220.8 lb 06/30/17 228.6 lb 06/23/17 231.0 lb Swallowing issues, if any: He gets "choked" at times. Sometimes he is able to swallow better than other times. He needs to drink water with small sips at a time. He is eating mostly softer foods like scrambled eggs, yogurt, soups, and shakes. He is occasionally drinking ensure or boost.  Smoking or chewing tobacco?  Using fluoride trays daily?  Last ENT visit was on: He has an appointment with Dr. Constance Holster on 07/22/17 Other notable issues, if any:  He continues to have taste changes. His neck has healed well. He did have moist desquamation after treatment and used silvadene with good results. He continues to use silvadene twice daily.   BP (!) 113/99   Pulse 60   Ht 5' 5.5" (1.664 m)   Wt 220 lb 12.8 oz (100.2 kg)   SpO2 97% Comment: room air  BMI 36.18 kg/m

## 2017-07-14 NOTE — Telephone Encounter (Signed)
Oncology Nurse Navigator Documentation  Called Alexander Ramirez, spoke with his wife re attending tomorrow morning's H&N [facility].  She stated he/they are unable to attend due to conflict.  I discussed the importance of SLP and Nutrition follow-ups post-treatment, she voiced understanding, noted they can come to the 9/11 [place]. She noted they were not aware of previously scheduled Nutrition appts, did not receive appt reminder calls.  Further stated received call indicating [employer] will not cover additional SLP appts.  I shared appt note indicating 2 visits have been authorized for 8/14-11/1/18. She stated husband has not had any difficulty swallowing, has gained several pounds. She voiced understanding I will share information with Glendell Docker SLP and [facility] Nutrition, schedule them for the 9/11 [place].  Gayleen Orem, RN, BSN, [place] Neck Oncology Nurse [place] at [place] [phone]

## 2017-07-15 ENCOUNTER — Encounter: Payer: Self-pay | Admitting: Radiation Oncology

## 2017-07-15 NOTE — Progress Notes (Signed)
  Radiation Oncology         [phone]) [phone] ________________________________  Name: Alexander Ramirez MRN: [id number]  Date: 07/15/2017  DOB: 12-18-1950  End of Treatment Note  Diagnosis:   Clinical: Stage I (cT1a, cN0, cM0)  Glottic Squamous Squamous Carcinoma  Indication for treatment:  Curative       Radiation treatment dates:   05/26/17 - 07/02/17  Site/dose: Larynx, 2.25 Gy x 28 fractions  Beams/energy:   3D, 6X  Narrative: The patient tolerated radiation treatment relatively well. Pt denied any throat or mouth pain at the beginning, but endorsed mild soreness to left facial ear area. He does not like drinking water and has been encouraged to increase his fluid intake to help combat potenial future dehydration/sputum and dry mouth issues. Pt has been fequently eating popsicles. Near the end of treatment pt reported a burning pain on the skin of his throat. He applied sonafine, but noted it would slide off the area due to skin moisture. Pt was prescibed Silvadene to prevent infection and his Rx for hycet was refilled. He denied any trouble breathing or sleeping. On exam his anterior neck showed moist desquamation without signs of infection.   Plan: The patient has completed radiation treatment. The patient will return to radiation oncology clinic for routine followup in one half month. I advised them to call or return sooner if they have any questions or concerns related to their recovery or treatment.  -----------------------------------  Eppie Gibson, MD  This document serves as a record of services personally performed by Eppie Gibson, MD. It was created on her behalf by Margit Banda, a trained medical scribe. The creation of this record is based on the scribe's personal observations and the provider's statements to them. This document has been checked and approved by the attending provider.

## 2017-07-16 ENCOUNTER — Encounter: Payer: Self-pay | Admitting: *Deleted

## 2017-07-16 ENCOUNTER — Encounter: Payer: Self-pay | Admitting: Radiation Oncology

## 2017-07-16 ENCOUNTER — Ambulatory Visit
Admission: RE | Admit: 2017-07-16 | Discharge: 2017-07-16 | Disposition: A | Discharge status: Home / Self Care | Payer: Medicare Other | Source: Ambulatory Visit | Attending: Radiation Oncology | Admitting: Radiation Oncology

## 2017-07-16 DIAGNOSIS — C320 Malignant neoplasm of glottis: Secondary | ICD-10-CM | POA: Diagnosis not present

## 2017-07-16 NOTE — Progress Notes (Signed)
Radiation Oncology         [phone]) [phone] ________________________________  Name: Alexander Ramirez MRN: [id number]  Date: 07/16/2017  DOB: 10-Jan-1951  Follow-Up Visit Note  CC: Colonel Bald, MD  Izora Gala, MD  Diagnosis and Prior Radiotherapy:       ICD-10-CM   1. 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 His larynx was treated to 63 Gy in 28 fractions from 05/26/2017 to 07/02/2017.     Pain issues, if any: He denies pain.   Using a feeding tube?: N/A  Weight changes, if any: He reports weighing 214.0 lb at his lowest at home after completing radiation. He has gained weight since that time.  07/16/17 220.8 lb 06/30/17 228.6 lb 06/23/17 231.0 lb  Swallowing issues, if any: He gets "choked" at times. Sometimes he is able to swallow. He is eating mostly softer foods like scrambled eggs, yogurt, soups, and shakes. He is occasionally drinking ensure or boost.    Last ENT visit was on: He has an appointment with Dr. Constance Holster on 07/22/17  Other notable issues, if any:  He continues to have taste changes. His neck has healed well. He did have moist desquamation after treatment and used silvadene with good results. He continues to use silvadene twice daily.              ALLERGIES:  has No Known Allergies.  Meds: Current Outpatient Prescriptions  Medication Sig Dispense Refill  . allopurinol (ZYLOPRIM) 300 MG tablet Take 300 mg by mouth 2 (two) times daily.    Marland Kitchen atorvastatin (LIPITOR) 40 MG tablet Take 40 mg by mouth daily.    . carvedilol (COREG) 6.25 MG tablet Take 6.25 mg by mouth 2 (two) times daily with a meal.    . telmisartan (MICARDIS) 80 MG tablet Take 80 mg by mouth daily.    Marland Kitchen HYDROcodone-acetaminophen (HYCET) 7.5-325 mg/15 ml solution Take 10-15 mLs by  mouth every 4 (four) hours as needed for moderate pain. Take with food. (Patient not taking: Reported on 07/16/2017) 250 mL 0  . lidocaine (XYLOCAINE) 2 % solution Patient: Mix 1part 2% viscous lidocaine, 1part H20. Swallow 81mL of diluted mixture, 38min before meals and at bedtime, up to QID (Patient not taking: Reported on 07/16/2017) 100 mL 5  . meloxicam (MOBIC) 15 MG tablet Take 15 mg by mouth daily.    Marland Kitchen senna (SENOKOT) 8.6 MG TABS tablet Take 1 tablet (8.6 mg total) by mouth at bedtime as needed for mild constipation. (Patient not taking: Reported on 07/16/2017) 60 each 1   No current facility-administered medications for this encounter.      Physical Findings: The patient is in no acute distress. Patient is alert and oriented. Wt Readings from Last 3 Encounters:  07/16/17 220 lb 12.8 oz (100.2 kg)  05/13/17 237 lb 9.6 oz (107.8 kg)  05/06/17 233 lb 9.6 oz (106 kg)    height is 5' 5.5" (1.664 m) and weight is 220 lb 12.8 oz (100.2 kg). His blood pressure is 113/99 (abnormal) and his pulse is 60. His oxygen saturation is 97%. .  General: Alert and oriented, in no acute distress. HEENT: Oral cavity and oropharynx are clear. Oropharynx is notable for missing uvula, consistent with baseline, and erythema in posterior pharynx. He still has  some hoarseness when he speaks. Neck: Neck is notable for healing skin  Skin: Skin in treatment fields shows satisfactory healing with some residual erythema. Psychiatric: Judgment and insight are intact. Affect is appropriate.   Lab Findings: Lab Results  Component Value Date   WBC 13.5 (H) 08/02/2009   HGB 17.0 11/02/2010   HCT 50.0 11/02/2010   MCV 90.9 08/02/2009   PLT 230 08/02/2009    No results found for: TSH  Radiographic Findings: No results found.  Impression/Plan:    1) Head and Neck Cancer Status: Healing from radiotherapy.  2) Nutritional Status: Lost weight during radiotherapy but has gained some back. He is eating mostly  softer foods like scrambled eggs, yogurt, soups, and shakes. He is occasionally drinking ensure or boost.  PEG tube: N/A  3) Swallowing: Sometimes able to swallow but gets "choked." He needs to drink water with small sips at a time.   4) Thyroid function: No results found for: TSH  Screen at next appt.  5) Other: Apply Vitamin E lotion to the treatment site to promote further skin healing.  6) Follow up with Dr. Constance Holster. Follow up with me in 3 months. The patient was encouraged to call with any issues or questions before then.  I spent 10 minutes minutes face to face with the patient and more than 50% of that time was spent in counseling and/or coordination of care. _____________________________________   Eppie Gibson, MD  This document serves as a record of services personally performed by Eppie Gibson, MD. It was created on her behalf by Rae Lips, a trained medical scribe. The creation of this record is based on the scribe's personal observations and the provider's statements to them. This document has been checked and approved by the attending provider.

## 2017-07-16 NOTE — Progress Notes (Signed)
Oncology Nurse Navigator Documentation  Met with Alexander Ramirez and his wife during 2-wk post-tmt RT follow-up with Dr. Isidore Moos. He reported:  Eating/drinking without difficulty with exception of bread, some meats.  Gaining weight.  Treatment area much improved, has been applying Sonafine BID.  Voice improvement. They reiterated their plan to attend H&N [facility] which starts 9/11. They were encouraged to call me with questions/concerns.  Gayleen Orem, RN, BSN, [place] Neck Oncology Nurse [place] at [place] [phone]

## 2017-07-17 ENCOUNTER — Other Ambulatory Visit: Payer: Self-pay | Admitting: Radiation Oncology

## 2017-07-17 DIAGNOSIS — R634 Abnormal weight loss: Secondary | ICD-10-CM

## 2017-07-17 DIAGNOSIS — C320 Malignant neoplasm of glottis: Secondary | ICD-10-CM

## 2017-07-23 ENCOUNTER — Telehealth: Payer: Self-pay | Admitting: *Deleted

## 2017-07-23 NOTE — Telephone Encounter (Signed)
Oncology Nurse Navigator Documentation  Spoke with Alexander Ramirez, confirmed his attendance at the 9/11 H&N [place] with an 0800 arrival to Radiation Waiting following lobby registration.  Gayleen Orem, RN, BSN, [place] Neck Oncology Nurse [place] at [place] [phone]

## 2017-07-25 ENCOUNTER — Telehealth: Payer: Self-pay | Admitting: *Deleted

## 2017-07-25 NOTE — Telephone Encounter (Addendum)
Oncology Nurse Navigator Documentation  Spoke with RN Bethena Roys, PCP's RN, informed results of 05/13/17 CT Chest Lung per Dr. Isidore Moos.  Noted Dr. Isidore Moos will monitor lung nodules, asks that Dr. Ellouise Newer continue to monitor coronary/liver disease.  Noted Dr. Isidore Moos has ordered repeat CT for 1 year per radiologist's recommendation.  Bethena Roys noted patient is seen on quarterly basis, next appt pending.  Report and guidance also faxed, fax receipt confirmation received.  Gayleen Orem, RN, BSN, [place] Neck Oncology Nurse [place] at [place] [phone]

## 2017-07-29 ENCOUNTER — Encounter: Payer: Self-pay | Admitting: *Deleted

## 2017-07-29 ENCOUNTER — Ambulatory Visit: Payer: Medicare Other | Attending: Radiation Oncology

## 2017-07-29 ENCOUNTER — Ambulatory Visit
Admission: RE | Admit: 2017-07-29 | Discharge: 2017-07-29 | Disposition: A | Discharge status: Home / Self Care | Payer: Medicare Other | Source: Ambulatory Visit | Attending: Radiation Oncology | Admitting: Radiation Oncology

## 2017-07-29 ENCOUNTER — Ambulatory Visit: Payer: Medicare Other | Admitting: Nutrition

## 2017-07-29 DIAGNOSIS — R1310 Dysphagia, unspecified: Secondary | ICD-10-CM | POA: Diagnosis present

## 2017-07-29 DIAGNOSIS — C320 Malignant neoplasm of glottis: Secondary | ICD-10-CM

## 2017-07-29 NOTE — Progress Notes (Signed)
Nutrition follow-up completed with patient during Head and neck clinic. Patient completed radiation therapy for glottis cancer on 07/02/2017 Weight decreased and documented as 220.8 pounds on August 29, down from 237.6 pounds in June. Patient remains obese at BMI 36.18. Patient reports he is eating well and can eat anything he wants. He does have some taste alterations, but it has not affected his oral intake. He has no questions or concerns.  Food and nutrition related knowledge deficit resolved.  Educated patient to continue strategies for adequate calories and protein to promote weight maintenance. Encouraged patient to contact me with questions or concerns.  **Disclaimer: This note was dictated with voice recognition software. Similar sounding words can inadvertently be transcribed and this note may contain transcription errors which may not have been corrected upon publication of note.**

## 2017-07-30 NOTE — Therapy (Signed)
[facility] [address] [place], [place], [address] Phone: [phone]   Fax:  [phone]  Speech Language Pathology Treatment  Patient Details  Name: Alexander Ramirez MRN: [id number] Date of Birth: [birth year] Referring Provider: Eppie Gibson, MD  Encounter Date: 07/29/2017      End of Session - 07/30/17 0840    Visit Number 2   Number of Visits 3   Date for SLP Re-Evaluation 08/08/17   SLP Start Time 0840   SLP Stop Time  0922   SLP Time Calculation (min) 42 min      Past Medical History:  Diagnosis Date  . Arthritis   . GERD (gastroesophageal reflux disease)    OTC meds  . Gout   . Hypercholesteremia   . Hypertension   . Kidney stones   . Sleep apnea    uses CPAP nightly    Past Surgical History:  Procedure Laterality Date  . HERNIA REPAIR    . KIDNEY STONE SURGERY    . MICROLARYNGOSCOPY WITH CO2 LASER AND EXCISION OF VOCAL CORD LESION Left 04/28/2017   Procedure: MICROLARYNGOSCOPY WITH EXCISION LEFT VOCAL CORD MASS WITH CO2 LASER;  Surgeon: Izora Gala, MD;  Location: [facility];  Service: ENT;  Laterality: Left;  . TONSILLECTOMY    . UVULOPALATOPHARYNGOPLASTY (UPPP)/TONSILLECTOMY/SEPTOPLASTY     for sleep apnea    There were no vitals filed for this visit.      Subjective Assessment - 07/29/17 0849    Subjective Pt is "eathing anything I want." Does not have good pharyngeal clearance with bready items.    Currently in Pain? No/denies               ADULT SLP TREATMENT - 07/30/17 0001      General Information   Behavior/Cognition Alert;Cooperative;Pleasant mood     Dysphagia Treatment   Temperature Spikes Noted No   Respiratory Status Room air   Oral Cavity - Dentition Adequate natural dentition   Treatment Methods Skilled observation;Therapeutic exercise;Patient/caregiver education   Patient observed directly with PO's Yes   Type of PO's observed Dysphagia 3  (soft);Thin liquids   Liquids provided via Cup   Oral Phase Signs & Symptoms --  none noted   Pharyngeal Phase Signs & Symptoms --  none noted   Other treatment/comments Pt completed HEP with min cues occasionally. He stated he had not completed HEP as directed and SLP reminded him about possible ramifications of this. SLP told pt to complete entire HEP BID. As above, no overt s/s aspiration with [place] and water. Pt stated he has modified his bite sizes smaller during the last few weeks but never was PO the entire rad tx. Pt reason for why he completes HEP was vague but acceptable. SLP explained in more detail.     Assessment / Recommendations / Plan   Plan Continue with current plan of care     Progression Toward Goals   Progression toward goals Progressing toward goals          SLP Education - 07/30/17 0839    Education provided Yes   Education Details HEP procedure, effects of not performing HEP   Person(s) Educated Patient;Spouse   Methods Explanation;Verbal cues   Comprehension Verbalized understanding;Returned demonstration;Verbal cues required;Need further instruction          SLP Short Term Goals - 07/30/17 0842      SLP SHORT TERM GOAL #1   Title pt will complete HEP with rare  min A    Time 1   Period --  visit   Status Not Met     SLP SHORT TERM GOAL #2   Title pt will tell SLP why he is completing HEP   Status Achieved          SLP Long Term Goals - 07/30/17 0843      SLP LONG TERM GOAL #1   Title pt will complete HEP with modified independecne   Time 1   Period --  visits   Status On-going     SLP LONG TERM GOAL #2   Title pt will tell SLP 3 overt s/s aspiration PNA with modified independence   Time 1   Period --  visits   Status On-going     SLP LONG TERM GOAL #3   Title pt will tell SLP why a food journal is helpful in returning to most liberal diet   Time 1   Period --  visits   Status On-going          Plan - 07/30/17 0840     Clinical Impression Statement Pt with oropharyngeal swallowing WFL, however with modified bite size and need to swallow with effrot consistently. Pt does not regularly attempt bread. His probability of swallowing difficulty remains high and he will need to be followed by SLP for regular assessment of accurate HEP completion as well as for safety with POs both during and following treatment/s.   Speech Therapy Frequency --  approx once every four weeks   Duration --  two therapy vists   Treatment/Interventions Pharyngeal strengthening exercises;Aspiration precaution training;Diet toleration management by SLP;Compensatory techniques;Internal/external aids;SLP instruction and feedback;Patient/family education;Oral motor exercises;Trials of upgraded texture/liquids   Potential to Achieve Goals Good      Patient will benefit from skilled therapeutic intervention in order to improve the following deficits and impairments:   Dysphagia, unspecified type    Problem List Patient Active Problem List   Diagnosis Date Noted  . Malignant neoplasm of glottis ([place]) 05/07/2017    [facility] ,[place], [place]  07/30/2017, 8:43 AM  [facility] [address] [place], [place], [address] Phone: [phone]   Fax:  [phone]   Name: Mansfield Dann MRN: [id number] Date of Birth: 1951-02-03

## 2017-07-30 NOTE — Progress Notes (Signed)
Oncology Nurse Navigator Documentation  Met with Mr. Cunliffe during H&N [place].  He was accompanied by his wife  Provided verbal and written overview of [place], the clinicians who will be seeing him, encouraged him to ask questions during his time with them.  He was seen by Nutrition, SLP, and SW for post-tmt follow-up.  Spoke with him at end of [facility], addressed questions. He understands I can be contacted with needs/concerns.  Gayleen Orem, RN, BSN, [place] at [place] [phone]

## 2017-07-30 NOTE — Patient Instructions (Signed)
Complete the exercises TWICE each day!

## 2017-08-27 ENCOUNTER — Encounter: Payer: Self-pay | Admitting: *Deleted

## 2017-08-27 NOTE — Progress Notes (Signed)
Oncology Nurse Navigator Documentation  Alexander Ramirez and his wife attended the Fall 2018 H&N [facility] survivorship series, 5 consecutive Tuesday evenings 6:00-7:15 pm beginning 07/29/17.  Gayleen Orem, RN, BSN, [place] Neck Oncology Nurse [place] at [place] [phone]

## 2017-09-25 ENCOUNTER — Ambulatory Visit: Payer: Medicare Other | Attending: Radiation Oncology

## 2017-09-25 DIAGNOSIS — R1310 Dysphagia, unspecified: Secondary | ICD-10-CM | POA: Insufficient documentation

## 2017-09-25 NOTE — Therapy (Signed)
[facility] [address] [place], [place], [address] Phone: [phone]   Fax:  [phone]  Speech Language Pathology Treatment  Patient Details  Name: Alexander Ramirez MRN: [id number] Date of Birth: 11-07-1951 Referring Provider: Eppie Gibson, MD   Encounter Date: 09/25/2017  End of Session - 09/25/17 1117    Visit Number  3    Number of Visits  5    Date for SLP Re-Evaluation  11/28/17    SLP Start Time  1011    SLP Stop Time   1045    SLP Time Calculation (min)  34 min    Activity Tolerance  Patient tolerated treatment well       Past Medical History:  Diagnosis Date  . Arthritis   . GERD (gastroesophageal reflux disease)    OTC meds  . Gout   . Hypercholesteremia   . Hypertension   . Kidney stones   . Sleep apnea    uses CPAP nightly    Past Surgical History:  Procedure Laterality Date  . HERNIA REPAIR    . KIDNEY STONE SURGERY    . TONSILLECTOMY    . UVULOPALATOPHARYNGOPLASTY (UPPP)/TONSILLECTOMY/SEPTOPLASTY     for sleep apnea    There were no vitals filed for this visit.  Subjective Assessment - 09/25/17 1013    Subjective  Pt cont to eat everything he wants, no longer has difficulty with bread.    Currently in Pain?  No/denies            ADULT SLP TREATMENT - 09/25/17 1014      General Information   Behavior/Cognition  Alert;Cooperative;Pleasant mood      Dysphagia Treatment   Temperature Spikes Noted  No    Respiratory Status  Room air    Oral Cavity - Dentition  Adequate natural dentition    Treatment Methods  Skilled observation;Therapeutic exercise;Patient/caregiver education    Patient observed directly with PO's  Yes    Type of PO's observed  Regular;Thin liquids    Liquids provided via  Cup    Oral Phase Signs & Symptoms  -- none noted    Pharyngeal Phase Signs & Symptoms  -- none noted    Other treatment/comments  Pt completed HEP today with occasional min A (req'd  cues on 2/6 exercises). Pt's voice is mild-mod harsh today, SLP also provided pt with vocal hygiene handout and discussed with him.       Assessment / Recommendations / Plan   Plan  -- every other month tx to ensure consistency over time      Progression Toward Goals   Progression toward goals  Progressing toward goals       SLP Education - 09/25/17 1117    Education provided  Yes    Education Details  HEP procedure, voice hygiene    Person(s) Educated  Patient    Methods  Explanation;Handout;Verbal cues    Comprehension  Verbalized understanding;Returned demonstration       SLP Short Term Goals - 07/30/17 0842      SLP SHORT TERM GOAL #1   Title  pt will complete HEP with rare min A     Time  1    Period  -- visit    Status  Not Met      SLP SHORT TERM GOAL #2   Title  pt will tell SLP why he is completing HEP    Status  Achieved       SLP  Long Term Goals - 09/25/17 1015      SLP LONG TERM GOAL #1   Title  pt will complete HEP with modified independence    Time  2    Period  -- visits (5 total visits)    Status  On-going      SLP LONG TERM GOAL #2   Title  pt will tell SLP 3 overt s/s aspiration PNA with modified independence    Time  2    Period  -- visits (5 total visits)      SLP LONG TERM GOAL #3   Title  pt will tell SLP why a food journal is helpful in returning to most liberal diet    Status  Deferred pt back to regular diet       Plan - 09/25/17 1140    Clinical Impression Statement  Pt with oropharyngeal swallowing WNL, however pt demonstrated HEP incorrectly and needed min cues occasionally. Due to this, he requires continued skilled ST. Pt also rec'd information on vocal hygiene as voice today was mild-mod harsh/rough and pt noted to strain and push voice to achieve voice onset.  His probability of swallowing difficulty remains high and he will need to be followed by SLP for assessment every other month for accuracy of HEP completion as well as for  safety with POs.    Speech Therapy Frequency  -- every other month    Duration  -- two additional therapy vists    Treatment/Interventions  Pharyngeal strengthening exercises;Aspiration precaution training;Diet toleration management by SLP;Compensatory techniques;Internal/external aids;SLP instruction and feedback;Patient/family education;Oral motor exercises;Trials of upgraded texture/liquids    Potential to Achieve Goals  Good       Patient will benefit from skilled therapeutic intervention in order to improve the following deficits and impairments:   Dysphagia, unspecified type - Plan: SLP plan of care cert/re-cert    Problem List Patient Active Problem List   Diagnosis Date Noted  . Malignant neoplasm of glottis ([place]) 05/07/2017    [facility] ,[place], [place]  09/25/2017, 11:49 AM  [facility] [address] [place], [place], [address] Phone: [phone]   Fax:  [phone]   Name: Alexander Ramirez MRN: [id number] Date of Birth: [birth year]

## 2017-09-25 NOTE — Patient Instructions (Signed)
Continue doing your swallow exercises, try to get twice a day moreso than once a day, at least 6 days/week.

## 2017-10-20 ENCOUNTER — Encounter: Payer: Self-pay | Admitting: Radiation Oncology

## 2017-10-20 NOTE — Progress Notes (Signed)
error 

## 2017-10-23 ENCOUNTER — Telehealth: Payer: Self-pay | Admitting: *Deleted

## 2017-10-23 NOTE — Telephone Encounter (Signed)
CALLED PATIENT TO INFORM THAT LAB AND FU HAVE BEEN CANCELLED DUE TO NOT HAVING SCAN, ONCE SCAN IS PRECERTED ALL APPTS, WILL BE RESCHEDULED, LVM FOR A RETURN CALL.

## 2017-10-24 ENCOUNTER — Ambulatory Visit: Payer: Medicare Other

## 2017-10-24 ENCOUNTER — Ambulatory Visit
Admission: RE | Admit: 2017-10-24 | Discharge: 2017-10-24 | Disposition: A | Discharge status: Home / Self Care | Payer: Medicare Other | Source: Ambulatory Visit | Attending: Radiation Oncology | Admitting: Radiation Oncology

## 2017-10-24 ENCOUNTER — Telehealth: Payer: Self-pay | Admitting: *Deleted

## 2017-10-24 DIAGNOSIS — C320 Malignant neoplasm of glottis: Secondary | ICD-10-CM | POA: Insufficient documentation

## 2017-10-24 HISTORY — DX: Personal history of irradiation: Z92.3

## 2017-10-24 NOTE — Telephone Encounter (Signed)
CALLED PATIENT TO INFORM OF CT FOR 10-30-17- ARRIVAL TIME - 1:15 PM @ [facility], NO RESTRICTIONS FOR TEST, AND HIS LAB ON 10-31-17 @ 9:30 AM @ [facility] ON 10-31-17 @ 10 AM, SPOKE WITH PATIENT AND HE IS AWARE OF THESE APPTS.

## 2017-10-29 NOTE — Progress Notes (Addendum)
Alexander Ramirez presents for follow up of radiation completed 07/02/17 to his Larynx   Pain issues, if any: He denies throat pain. He does have pain to his esophagus. He has chronic knee pain.  Using a feeding tube?: N/A Weight changes, if any:  Wt Readings from Last 3 Encounters:  10/31/17 243 lb (110.2 kg)  07/29/17 228 lb 9.6 oz (103.7 kg)  07/16/17 220 lb 12.8 oz (100.2 kg)   Swallowing issues, if any: He denies. He has a chronic dry throat.  Smoking or chewing tobacco? No Using fluoride trays daily? No Last ENT visit was on: Dr. Constance Holster 09/25/17, He will see him again in February Other notable issues, if any:  He report severe acid reflux He reports tooth loss and problems with his teeth since completing radiation.   BP (!) 141/87   Pulse (!) 57   Temp 98 F (36.7 C)   Ht 5' 5.5" (1.664 m)   Wt 243 lb (110.2 kg)   SpO2 98% Comment: room air  BMI 39.82 kg/m

## 2017-10-30 ENCOUNTER — Ambulatory Visit (HOSPITAL_COMMUNITY)
Admission: RE | Admit: 2017-10-30 | Discharge: 2017-10-30 | Disposition: A | Discharge status: Home / Self Care | Payer: Medicare Other | Source: Ambulatory Visit | Attending: Radiation Oncology | Admitting: Radiation Oncology

## 2017-10-30 DIAGNOSIS — I2510 Atherosclerotic heart disease of native coronary artery without angina pectoris: Secondary | ICD-10-CM | POA: Diagnosis not present

## 2017-10-30 DIAGNOSIS — K760 Fatty (change of) liver, not elsewhere classified: Secondary | ICD-10-CM | POA: Diagnosis not present

## 2017-10-30 DIAGNOSIS — R918 Other nonspecific abnormal finding of lung field: Secondary | ICD-10-CM | POA: Diagnosis present

## 2017-10-30 DIAGNOSIS — I700 Atherosclerosis of aorta: Secondary | ICD-10-CM | POA: Diagnosis not present

## 2017-10-31 ENCOUNTER — Encounter: Payer: Self-pay | Admitting: Radiation Oncology

## 2017-10-31 ENCOUNTER — Ambulatory Visit
Admission: RE | Admit: 2017-10-31 | Discharge: 2017-10-31 | Disposition: A | Discharge status: Home / Self Care | Payer: Medicare Other | Source: Ambulatory Visit | Attending: Radiation Oncology | Admitting: Radiation Oncology

## 2017-10-31 ENCOUNTER — Telehealth: Payer: Self-pay

## 2017-10-31 VITALS — BP 141/87 | HR 57 | Temp 98.0°F | Ht 65.5 in | Wt 243.0 lb

## 2017-10-31 DIAGNOSIS — C320 Malignant neoplasm of glottis: Secondary | ICD-10-CM | POA: Diagnosis present

## 2017-10-31 DIAGNOSIS — R634 Abnormal weight loss: Secondary | ICD-10-CM

## 2017-10-31 LAB — BASIC METABOLIC PANEL
Anion Gap: 9 mEq/L (ref 3–11)
BUN: 25.4 mg/dL (ref 7.0–26.0)
CALCIUM: 9.4 mg/dL (ref 8.4–10.4)
CO2: 23 meq/L (ref 22–29)
CREATININE: 0.8 mg/dL (ref 0.7–1.3)
Chloride: 111 mEq/L — ABNORMAL HIGH (ref 98–109)
EGFR: >60 mL/min/{1.73_m2} (ref >60)
Glucose: 80 mg/dl (ref 70–140)
Potassium: 4.6 mEq/L (ref 3.5–5.1)
SODIUM: 143 meq/L (ref 136–145)

## 2017-10-31 LAB — CBC WITH DIFFERENTIAL/PLATELET
BASO%: 0.6 % (ref 0.0–2.0)
Basophils Absolute: 0 10*3/uL (ref 0.0–0.1)
EOS%: 2.2 % (ref 0.0–7.0)
Eosinophils Absolute: 0.2 10*3/uL (ref 0.0–0.5)
HCT: 45.7 % (ref 38.4–49.9)
HGB: 14.9 g/dL (ref 13.0–17.1)
LYMPH%: 25.5 % (ref 14.0–49.0)
MCH: 30.5 pg (ref 27.2–33.4)
MCHC: 32.6 g/dL (ref 32.0–36.0)
MCV: 93.5 fL (ref 79.3–98.0)
MONO#: 0.7 10*3/uL (ref 0.1–0.9)
MONO%: 10.7 % (ref 0.0–14.0)
NEUT%: 61 % (ref 39.0–75.0)
NEUTROS ABS: 4.2 10*3/uL (ref 1.5–6.5)
Platelets: 145 10*3/uL (ref 140–400)
RBC: 4.89 10*6/uL (ref 4.20–5.82)
RDW: 13.5 % (ref 11.0–14.6)
WBC: 6.9 10*3/uL (ref 4.0–10.3)
lymph#: 1.8 10*3/uL (ref 0.9–3.3)

## 2017-10-31 LAB — TSH: TSH: 1.072 m[IU]/L (ref 0.320–4.118)

## 2017-10-31 MED ORDER — LARYNGOSCOPY SOLUTION RAD-ONC
15.0000 mL | Freq: Once | TOPICAL | Status: AC
  Administered 2017-10-31: 15 mL via TOPICAL
  Filled 2017-10-31: qty 15

## 2017-10-31 NOTE — Telephone Encounter (Signed)
I called Alexander Ramirez and let him know that his Thyroid function level from his labs today was normal. He voiced his appreciation. He knows to call if he has any further questions or concerns.

## 2017-10-31 NOTE — Progress Notes (Signed)
Radiation Oncology         [phone] ________________________________  Name: Alexander Ramirez MRN: [id number]  Date: 10/31/2017  DOB: 31-Oct-1951  Follow-Up Visit Note  CC: Colonel Bald, MD  Izora Gala, MD  Diagnosis and Prior Radiotherapy:       ICD-10-CM   1. 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  CHIEF COMPLAINT:  Here for follow-up and surveillance of head and neck cancer and to review recent imaging results  Narrative:  The patient returns today for routine follow-up after completing radiation to his larynx.   He is also here to discuss his recent imaging results for lung cancer screening. Fortunately, CT scan of his chest performed 10/30/2017 showed no suspicious pulmonary nodules or masses.   Pain issues, if any: He does have pain to his esophagus. He reports persistent burning pain with swallowing. He has chronic knee pain.   Using a feeding tube?: N/A  Weight changes, if any:  Wt Readings from Last 3 Encounters:  10/31/17 243 lb (110.2 kg)  07/29/17 228 lb 9.6 oz (103.7 kg)  07/16/17 220 lb 12.8 oz (100.2 kg)   Swallowing issues, if any: He reports burning pain with swallowing. He has a chronic dry throat.   Smoking or chewing tobacco? No  Using fluoride trays daily? No  Last ENT visit was on: 09/25/2017, Dr. Constance Holster noted some edema and erythema of the cords and supraglottic larynx on laryngoscopy but no masses. The patient will see Dr. Constance Holster again in February 2019.   Other notable issues, if any: He reports vomiting several times due to severe acid reflux. He states that he started Prilosec yesterday. He reports tooth loss and problems with his teeth since completing radiation. He also complains of thickness to his  posterior neck             ALLERGIES:  has No Known Allergies.  Meds: Current Outpatient Medications  Medication Sig Dispense Refill  . allopurinol (ZYLOPRIM) 300 MG tablet Take 300 mg by mouth 2 (two) times daily.    Marland Kitchen atorvastatin (LIPITOR) 40 MG tablet Take 40 mg by mouth daily.    . carvedilol (COREG) 6.25 MG tablet Take 6.25 mg by mouth 2 (two) times daily with a meal.    . meloxicam (MOBIC) 15 MG tablet Take 15 mg by mouth daily.    Marland Kitchen omeprazole (PRILOSEC) 20 MG capsule Take 20 mg by mouth daily.    Marland Kitchen telmisartan (MICARDIS) 80 MG tablet Take 80 mg by mouth daily.    Marland Kitchen HYDROcodone-acetaminophen (HYCET) 7.5-325 mg/15 ml solution Take 10-15 mLs by mouth every 4 (four) hours as needed for moderate pain. Take with food. (Patient not taking: Reported on 07/16/2017) 250 mL 0  . lidocaine (XYLOCAINE) 2 % solution Patient: Mix 1part 2% viscous lidocaine, 1part H20. Swallow 97mL of diluted mixture, 80min before meals and at bedtime, up to QID (Patient not taking: Reported on 07/16/2017) 100 mL 5  . senna (SENOKOT) 8.6 MG TABS tablet Take 1 tablet (8.6 mg total) by mouth at bedtime as needed for mild constipation. (Patient not taking: Reported on 07/16/2017) 60 each 1   Current Facility-Administered Medications  Medication Dose Route Frequency Provider Last Rate Last Dose  . laryngocopy solution for Rad-Onc  15  mL Topical Once Eppie Gibson, MD           Physical Findings: Wt Readings from Last 3 Encounters:  10/31/17 243 lb (110.2 kg)  07/29/17 228 lb 9.6 oz (103.7 kg)  07/16/17 220 lb 12.8 oz (100.2 kg)    height is 5' 5.5" (1.664 m) and weight is 243 lb (110.2 kg). His temperature is 98 F (36.7 C). His blood pressure is 141/87 (abnormal) and his pulse is 57 (abnormal). His oxygen saturation is 98%.   General: Alert and oriented, in no acute distress. HEENT: Head is normocephalic. Oropharynx is clear. He does not have a uvula. His mouth is somewhat dry. No oral lesions. He has some  exposed bone in the posterior molar space of the right jaw. Neck: Neck is supple, no palpable cervical or supraclavicular lymphadenopathy. He has no obvious mass in the posterior neck, though very subtle thickening of the subcutaneous tissue along the midline of his posterior neck - not a clinical concern.  Heart: Regular in rate and rhythm with no murmurs, rubs, or gallops. Chest: Clear to auscultation bilaterally, with no rhonchi, wheezes, or rales.  Lymphatics: see Neck Exam Skin: No concerning lesions. The skin over his neck has healed well. Neurologic: Speech is fluent. Coordination is intact. Psychiatric: Judgment and insight are intact. Affect is appropriate.  PROCEDURE NOTE: After obtaining consent and anesthetizing the nasal cavity with topical lidocaine and phenylephrine, the flexible endoscope was introduced and passed through the nasal cavity.  He has some slight erythema in the supraglottis, but no nodules noted in the pharynx or larynx/cords. Cords are symmetrically mobile.   Lab Findings: Lab Results  Component Value Date   WBC 6.9 10/31/2017   HGB 14.9 10/31/2017   HCT 45.7 10/31/2017   MCV 93.5 10/31/2017   PLT 145 10/31/2017    Lab Results  Component Value Date   TSH 1.072 10/31/2017    Radiographic Findings: Ct Chest Lung Ca Screen Low Dose W/o Cm  Result Date: 10/30/2017 CLINICAL DATA:  66 year old male with 35 pack year history of smoking. Lung cancer screening. EXAM: CT CHEST WITHOUT CONTRAST LOW-DOSE FOR LUNG CANCER SCREENING TECHNIQUE: Multidetector CT imaging of the chest was performed following the standard protocol without IV contrast. COMPARISON:  05/13/2017 FINDINGS: Cardiovascular: The heart size is normal. No pericardial effusion. Coronary artery calcification is evident. Atherosclerotic calcification is noted in the wall of the thoracic aorta. Mediastinum/Nodes: No mediastinal lymphadenopathy. No evidence for gross hilar lymphadenopathy although  assessment is limited by the lack of intravenous contrast on today's study. The esophagus has normal imaging features. There is no axillary lymphadenopathy. Lungs/Pleura: Scattered tiny bilateral pulmonary nodules are again identified, many of which are densely calcified consistent with granulomata. No suspicious pulmonary nodule or mass. No focal airspace consolidation. No pulmonary edema or pleural effusion. Upper Abdomen: The liver shows diffusely decreased attenuation suggesting steatosis. Musculoskeletal: Bone windows reveal no worrisome lytic or sclerotic osseous lesions. IMPRESSION: 1. Lung-RADS 2, benign appearance or behavior. Continue annual screening with low-dose chest CT without contrast in 12 months. 2. Coronary artery and Aortic Atherosclerois (ICD10-170.0) 3. Hepatic steatosis. Electronically Signed   By: Misty Stanley M.D.   On: 10/30/2017 17:19    Impression/Plan:    1) Head and Neck Cancer Status: NED. Healing from radiotherapy.  2) Nutritional Status: He has gained about 25 pounds since completing radiation. PEG tube: N/A  3) Swallowing: His swallowing is painful due to acid reflux. He is trying Prilosec for this. I advised  him to see his PCP if acid reflux persists or worsens.   4) Thyroid function: Continue routine TSH testing - WNL today. If he ends up needing a thyroid supplement, his preferred pharmacy is [facility] in [place], [place]. Lab Results  Component Value Date   TSH 1.072 10/31/2017    5) Other: See Dentistry for teeth problems.   6) Follow up with Dr. Constance Holster in 2 months. Follow up with me in 5 months. The patient was encouraged to call with any issues or questions before then.  7) Followup with PCP for annual lung cancer screening - today's CT results are without cancer.  Defer to PCP on future studies, pt will discuss with them.  8) Pt inquired re: clearance for intubation during knee surgery: this should be pursued with ENT.    I spent 30 minutes face  to face with the patient and more than 50% of that time was spent in counseling and/or coordination of care. _____________________________________   Eppie Gibson, MD  This document serves as a record of services personally performed by Eppie Gibson, MD. It was created on her behalf by Rae Lips, a trained medical scribe. The creation of this record is based on the scribe's personal observations and the provider's statements to them. This document has been checked and approved by the attending provider.

## 2017-11-01 ENCOUNTER — Encounter: Payer: Self-pay | Admitting: Radiation Oncology

## 2017-11-14 ENCOUNTER — Ambulatory Visit: Payer: Medicare Other

## 2017-11-27 NOTE — Pre-Procedure Instructions (Signed)
[name]  11/27/2017      [facility] - [place], [place] - [address], [address], [address] [place] [address] Phone: [phone] Fax: [phone]    Your procedure is scheduled on 12/09/2017.  Report to [facility] Admitting at 0830 A.M.  Call this number if you have problems the morning of surgery:  [phone]   Remember:  Do not eat food or drink liquids after midnight.   Continue all medications as directed by your physician except follow these medication instructions before surgery below   Take these medicines the morning of surgery with A SIP OF WATER: Allopurinol (Zyloprim) Carvedilol (Coreg) Ranitidine (Zantac)  7 days prior to surgery STOP taking any Meloxicam (Mobic), Aspirin (unless otherwise instructed by your surgeon), Aleve, Naproxen, Ibuprofen, Motrin, Advil, Goody's, BC's, all herbal medications, fish oil, and all vitamins    Do not wear jewelry  Do not wear lotions, powders, or colognes, or deodorant.  Men may shave face and neck.  Do not bring valuables to the hospital.  [facility] is not responsible for any belongings or valuables.  Hearing aids, eyeglasses, contacts, dentures or bridgework may not be worn into surgery.  Leave your suitcase in the car.  After surgery it may be brought to your room.  For patients admitted to the hospital, discharge time will be determined by your treatment team.  Patients discharged the day of surgery will not be allowed to drive home.   Name and phone number of your driver:    Special instructions:   [place]- Preparing For Surgery  Before surgery, you can play an important role. Because skin is not sterile, your skin needs to be as free of germs as possible. You can reduce the number of germs on your skin by washing with CHG (chlorahexidine gluconate) Soap before surgery.  CHG is an antiseptic cleaner which kills germs and bonds with the skin to continue  killing germs even after washing.  Please do not use if you have an allergy to CHG or antibacterial soaps. If your skin becomes reddened/irritated stop using the CHG.  Do not shave (including legs and underarms) for at least 48 hours prior to first CHG shower. It is OK to shave your face.  Please follow these instructions carefully.   1. Shower the NIGHT BEFORE SURGERY and the MORNING OF SURGERY with CHG.   2. If you chose to wash your hair, wash your hair first as usual with your normal shampoo.  3. After you shampoo, rinse your hair and body thoroughly to remove the shampoo.  4. Use CHG as you would any other liquid soap. You can apply CHG directly to the skin and wash gently with a scrungie or a clean washcloth.   5. Apply the CHG Soap to your body ONLY FROM THE NECK DOWN.  Do not use on open wounds or open sores. Avoid contact with your eyes, ears, mouth and genitals (private parts). Wash Face and genitals (private parts)  with your normal soap.  6. Wash thoroughly, paying special attention to the area where your surgery will be performed.  7. Thoroughly rinse your body with warm water from the neck down.  8. DO NOT shower/wash with your normal soap after using and rinsing off the CHG Soap.  9. Pat yourself dry with a CLEAN TOWEL.  10. Wear CLEAN PAJAMAS to bed the night before surgery, wear comfortable clothes the morning of surgery  11. Place CLEAN SHEETS on your bed the night of your first shower and DO NOT SLEEP WITH PETS.    Day of Surgery: Shower as stated above. Do not apply any deodorants/lotions. Please wear clean clothes to the hospital/surgery center.      Please read over the following fact sheets that you were given. Coughing and Deep Breathing, MRSA Information and Surgical Site Infection Prevention

## 2017-11-28 ENCOUNTER — Other Ambulatory Visit: Payer: Self-pay

## 2017-11-28 ENCOUNTER — Encounter (HOSPITAL_COMMUNITY): Payer: Self-pay

## 2017-11-28 ENCOUNTER — Encounter (HOSPITAL_COMMUNITY)
Admission: RE | Admit: 2017-11-28 | Discharge: 2017-11-28 | Disposition: A | Discharge status: Home / Self Care | Payer: PPO | Source: Ambulatory Visit | Attending: Orthopedic Surgery | Admitting: Orthopedic Surgery

## 2017-11-28 DIAGNOSIS — Z79899 Other long term (current) drug therapy: Secondary | ICD-10-CM | POA: Insufficient documentation

## 2017-11-28 DIAGNOSIS — Z923 Personal history of irradiation: Secondary | ICD-10-CM | POA: Insufficient documentation

## 2017-11-28 DIAGNOSIS — M109 Gout, unspecified: Secondary | ICD-10-CM | POA: Insufficient documentation

## 2017-11-28 DIAGNOSIS — E7800 Pure hypercholesterolemia, unspecified: Secondary | ICD-10-CM | POA: Diagnosis not present

## 2017-11-28 DIAGNOSIS — Z87442 Personal history of urinary calculi: Secondary | ICD-10-CM | POA: Insufficient documentation

## 2017-11-28 DIAGNOSIS — G4733 Obstructive sleep apnea (adult) (pediatric): Secondary | ICD-10-CM | POA: Insufficient documentation

## 2017-11-28 DIAGNOSIS — I10 Essential (primary) hypertension: Secondary | ICD-10-CM | POA: Insufficient documentation

## 2017-11-28 DIAGNOSIS — K219 Gastro-esophageal reflux disease without esophagitis: Secondary | ICD-10-CM | POA: Diagnosis not present

## 2017-11-28 DIAGNOSIS — Z8521 Personal history of malignant neoplasm of larynx: Secondary | ICD-10-CM | POA: Insufficient documentation

## 2017-11-28 DIAGNOSIS — Z01812 Encounter for preprocedural laboratory examination: Secondary | ICD-10-CM | POA: Diagnosis not present

## 2017-11-28 DIAGNOSIS — Z87891 Personal history of nicotine dependence: Secondary | ICD-10-CM | POA: Diagnosis not present

## 2017-11-28 DIAGNOSIS — Z01818 Encounter for other preprocedural examination: Secondary | ICD-10-CM | POA: Diagnosis not present

## 2017-11-28 HISTORY — DX: Malignant (primary) neoplasm, unspecified: C80.1

## 2017-11-28 LAB — BASIC METABOLIC PANEL
ANION GAP: 9 (ref 5–15)
BUN: 16 mg/dL (ref 6–20)
CO2: 24 mmol/L (ref 22–32)
Calcium: 9.1 mg/dL (ref 8.9–10.3)
Chloride: 108 mmol/L (ref 101–111)
Creatinine, Ser: 0.86 mg/dL (ref 0.61–1.24)
GFR calc Af Amer: >60 mL/min (ref >60)
GFR calc non Af Amer: >60 mL/min (ref >60)
GLUCOSE: 94 mg/dL (ref 65–99)
POTASSIUM: 4.2 mmol/L (ref 3.5–5.1)
Sodium: 141 mmol/L (ref 135–145)

## 2017-11-28 LAB — CBC
HEMATOCRIT: 46.6 % (ref 39.0–52.0)
HEMOGLOBIN: 15.3 g/dL (ref 13.0–17.0)
MCH: 30.4 pg (ref 26.0–34.0)
MCHC: 32.8 g/dL (ref 30.0–36.0)
MCV: 92.5 fL (ref 78.0–100.0)
Platelets: 162 10*3/uL (ref 150–400)
RBC: 5.04 MIL/uL (ref 4.22–5.81)
RDW: 13.7 % (ref 11.5–15.5)
WBC: 7.4 10*3/uL (ref 4.0–10.5)

## 2017-11-28 LAB — SURGICAL PCR SCREEN
MRSA, PCR: NEGATIVE
Staphylococcus aureus: NEGATIVE

## 2017-11-28 NOTE — Progress Notes (Signed)
PCP - Dr. Ellouise Newer Cardiologist - patient denies  Chest x-ray - n/a EKG - 04/21/2017 Stress Test - patient states it was 15+ years ago ECHO - patient denies Cardiac Cath - patient denies  Sleep Study - 5-6 years ago at [facility] with PCP; requesting sleep study CPAP - yes, pressure settings 12  Anesthesia review: n/a  Patient denies shortness of breath, fever, cough and chest pain at PAT appointment   Patient verbalized understanding of instructions that were given to them at the PAT appointment. Patient was also instructed that they will need to review over the PAT instructions again at home before surgery.

## 2017-12-03 NOTE — Progress Notes (Addendum)
Anesthesia Chart Review: Patient is a 67 year old male posted for right unicompartmental knee (but told case was right uni versus TKA per surgeon's staff and patient report) on 12/09/17 by Dr. Marchia Bond. I was initially contacted by staff at Dr. Luanna Cole office to review back in November. I had reviewed available information with anesthesiologist Dr. Roberts Gaudy at that time (including chest CT, EKG, 06/2017, echo, clearance note, RAD-ONC notes). Patient had medical clearance, so if PAT findings stable and otherwise no new changes it was anticipated that he could proceed as planned. I had planned to evaluate patient once he had his PAT visit (due to history of radiation for left vocal cord cancer), but when I followed up PAT scheduling, I noted that he had already had PAT on 11/28/17.   History includes former smoker, SCC left vocal cord (s/p radiation 05/26/18-07/02/17), HTN, hypercholesterolemia, OSA (CPAP; history of UPPP), GERD, nephrolithiasis, gout, tonsillectomy. BMI is consistent with obesity.   - PCP is Dr. Marjorie Smolder with [facility]. Patient was seen on 09/11/17. Dr. Ellouise Newer wrote, "Patient is low risk for surgery from cardiac and medical standpoint."  - ENT is Dr. Izora Gala. Last visit was 09/25/17. He wrote, "20-month return, doing very well. He came back all the weight he lost during radiation. He is voice and breathing/swallowing are doing well. On exam, he is breathing clearly. Voice is a little bit rough. Nasal exam clear. Oral cavity and pharynx are unremarkable. Indirect laryngoscopy reveals healthy mobile cords with some edema and erythema of the cords and the supraglottic larynx but no mucosal masses are identified. There is no palpable adenopathy." Dr. Constance Holster cleared patient from surgery from an ENT standpoint.  - RAD-ONC is Dr. Eppie Gibson, last visit 10/31/17. She cleared him from an oncology standpoint, but deferred "clearance for intubation" to ENT.  Meds  include allopurinol, Lipitor, Coreg, Zantac, Micardis.  BP (!) 148/82   Pulse 61   Temp 36.5 C   Resp 20   Ht 5\' 6"  (1.676 m)   Wt 245 lb 14.4 oz (111.5 kg)   SpO2 98%   BMI 39.69 kg/m   EKG 04/21/17: SB at 55 bpm, possible LAE.   Echo 05/25/12 [facility]): Conclusions: 1.  Sinus rhythm 2.  This was a technically difficult study with suboptimal views due to body habitus. 3.  The cavity size is decreased.   4.  There is mild concentric LVH. 5.  Overall left ventricular systolic function is normal with an EF between 65 and 70%. 6.  The right ventricle is normal in size and function. 7.  Aortic valve is trileaflet and is mildly thickened. 8.  There is trace mitral regurgitation. 9.  Trace tricuspid regurgitation present. 10.  There is no pericardial effusion. CT chest lung CA screen low dose 05/13/17: IMPRESSION: 1. Lung-RADS 2-S, benign appearance or behavior. Continue annual screening with low-dose chest CT without contrast in 12 months. 2. The "S" modifier above refers to potentially clinically significant non lung cancer related findings. Specifically, left main and 1 vessel coronary atherosclerosis. 3. Diffuse hepatic steatosis. Aortic Atherosclerosis  Preoperative labs noted. CBC and BMET WNL.  Patient with left vocal cord cancer s/p radiation (completed 07/02/17). Unfortunately, I missed patient's PAT visit, but I did discuss this history with anesthesiologist Dr. Adele Barthel. Patient will likely be considered for spinal anesthesia. I did call and speak with Alexander Ramirez. He does have chronic hoarseness, but reports he is doing well following radiation. He denied  any limitation with neck mobility or mouth opening. He denied dysphagia or SOB. He states that he was actually discharged from speech therapy on 12/04/17. He does use CPAP nightly. He is aware that spinal anesthesia will be considered. He denied prior back surgeries. He does not have any known valvular stenoses,  bleeding disorders, blood thinners. He reports he will ultimately need left TKA in the future. Anesthesiologist to evaluate on the day of surgery.    Alexander Ramirez Phone [phone] 12/04/2017 4:45 PM

## 2017-12-04 ENCOUNTER — Ambulatory Visit: Payer: PPO | Attending: Radiation Oncology

## 2017-12-04 DIAGNOSIS — R1310 Dysphagia, unspecified: Secondary | ICD-10-CM | POA: Insufficient documentation

## 2017-12-04 NOTE — Patient Instructions (Signed)
Signs of Aspiration Pneumonia   . Chest pain/tightness . Fever (can be low grade) . Cough  o With foul-smelling phlegm (sputum) o With sputum containing pus or blood o With greenish sputum . Fatigue  . Shortness of breath  . Wheezing   **IF YOU HAVE THESE SIGNS, CONTACT YOUR DOCTOR OR GO TO THE EMERGENCY DEPARTMENT OR URGENT CARE AS SOON AS POSSIBLE**      

## 2017-12-04 NOTE — Therapy (Signed)
[facility] [address] [place], [place], [address] Phone: [phone]   Fax:  [phone]  Speech Language Pathology Treatment  Patient Details  Name: Alexander Ramirez MRN: [id number] Date of Birth: 04-Jul-1951 Referring Provider: Eppie Gibson, MD   Encounter Date: 12/04/2017  End of Session - 12/04/17 1200    Visit Number  4    Number of Visits  5    Date for SLP Re-Evaluation  11/28/17    SLP Start Time  0804    SLP Stop Time   0829    SLP Time Calculation (min)  25 min    Activity Tolerance  Patient tolerated treatment well       Past Medical History:  Diagnosis Date  . Arthritis   . Cancer (HCC)    throat cancer  . GERD (gastroesophageal reflux disease)    OTC meds  . Gout   . History of radiation therapy 05/26/2017- 07/02/2017   Larynx 63 Gy in 28 fractions.   . Hypercholesteremia   . Hypertension   . Kidney stones   . Sleep apnea    uses CPAP nightly    Past Surgical History:  Procedure Laterality Date  . HERNIA REPAIR    . KIDNEY STONE SURGERY    . MICROLARYNGOSCOPY WITH CO2 LASER AND EXCISION OF VOCAL CORD LESION Left 04/28/2017   Procedure: MICROLARYNGOSCOPY WITH EXCISION LEFT VOCAL CORD MASS WITH CO2 LASER;  Surgeon: Izora Gala, MD;  Location: [facility];  Service: ENT;  Laterality: Left;  . TONSILLECTOMY    . UVULOPALATOPHARYNGOPLASTY (UPPP)/TONSILLECTOMY/SEPTOPLASTY     for sleep apnea    There were no vitals filed for this visit.  Subjective Assessment - 12/04/17 0809    Subjective  Pt has little trouble with any POs.             ADULT SLP TREATMENT - 12/04/17 0810      General Information   Behavior/Cognition  Alert;Cooperative;Pleasant mood      Treatment Provided   Treatment provided  Dysphagia      Dysphagia Treatment   Temperature Spikes Noted  No    Respiratory Status  Room air    Oral Cavity - Dentition  Adequate natural dentition    Treatment  Methods  Skilled observation;Therapeutic exercise;Patient/caregiver education    Patient observed directly with PO's  Yes    Type of PO's observed  Regular;Thin liquids    Liquids provided via  Cup    Oral Phase Signs & Symptoms  -- none    Pharyngeal Phase Signs & Symptoms  -- none    Other treatment/comments  HEP completed approx 5-6 days/week, once a day. Pt independent with HEP today. He told SLP 4 overt s/s aspiratoin PNA with modified independence. Pt told SLP why he needs to cont to perform HEP and when he needed to reduce freequency to x2-3/week.       Assessment / Recommendations / Plan   Plan  Discharge SLP treatment due to (comment) met goals      Progression Toward Goals   Progression toward goals  Goals met, education completed, patient discharged from SLP       SLP Education - 12/04/17 1200    Education provided  Yes    Education Details  s/s aspiration PNA    Person(s) Educated  Patient    Methods  Explanation;Handout    Comprehension  Verbalized understanding       SLP Short Term Goals -  12/08/17 0846      SLP SHORT TERM GOAL #1   Title  pt will complete HEP with rare min A     Status  Not Met      SLP SHORT TERM GOAL #2   Title  pt will tell SLP why he is completing HEP    Status  Achieved       SLP Long Term Goals - 12/08/2017 0825      SLP LONG TERM GOAL #1   Title  pt will complete HEP with modified independence  (Pended)     Status  Achieved  (Pended)       SLP LONG TERM GOAL #2   Title  pt will tell SLP 3 overt s/s aspiration PNA with modified independence  (Pended)     Status  Achieved  (Pended)       SLP LONG TERM GOAL #3   Title  pt will tell SLP why a food journal is helpful in returning to most liberal diet  (Pended)     Status  Deferred  (Pended)  pt back to regular diet       Plan - 12-08-2017 1201    Clinical Impression Statement  Pt ate crackers and drank H2O this morning with observation by SLP, and his HEP was completed independently.  Swallowing ability is assessed as WNL. Pt remains safe with PO intake (return to premorbid diet items) and is independent with HEP. He is appropriate for d/c and agrees with this.    Treatment/Interventions  Pharyngeal strengthening exercises;Aspiration precaution training;Diet toleration management by SLP;Compensatory techniques;Internal/external aids;SLP instruction and feedback;Patient/family education;Oral motor exercises;Trials of upgraded texture/liquids    Potential to Achieve Goals  Good       Patient will benefit from skilled therapeutic intervention in order to improve the following deficits and impairments:   Dysphagia, unspecified type  G-Codes - Dec 08, 2017 0846    Functional Assessment Tool Used  clinical judgement, pt report    Functional Limitations  Swallowing    Swallow Goal Status (O1157)  At least 1 percent but less than 20 percent impaired, limited or restricted    Swallow Discharge Status [phone])  0 percent impaired, limited or restricted       Problem List Patient Active Problem List   Diagnosis Date Noted  . Malignant neoplasm of glottis ([facility] [place]) 05/07/2017   SPEECH THERAPY RENEWAL/DISCHARGE SUMMARY  Visits from Start of Care: 4  Current functional level related to goals / functional outcomes: Pt will be seen today with focus on LTGs pertaining to swallowing safety (HEP completion, and safe PO intake). Today, pt met LTGs and swallowing was assessed as WNL/baseline. See above for more details. Pt has become more compliant in the last 2 visits re: completion of HEP.   Remaining deficits: None   Education / Equipment: HEP for swallowing, late effects head/neck radiation.   Plan: Patient agrees to discharge.  Patient goals were met. Patient is being discharged due to meeting the stated rehab goals.  ?????        [place] ,[place], CCC-SLP  2017-12-08, 12:04 PM  [facility] [address] [place], [place], [address] Phone: [phone]   Fax:  [phone]   Name: Alexander Ramirez MRN: [id number] Date of Birth: Sep 28, 1951

## 2017-12-09 ENCOUNTER — Encounter (HOSPITAL_COMMUNITY)
Admission: RE | Disposition: A | Discharge status: Home / Self Care | Payer: Self-pay | Source: Ambulatory Visit | Attending: Orthopedic Surgery

## 2017-12-09 ENCOUNTER — Ambulatory Visit (HOSPITAL_COMMUNITY): Payer: PPO | Admitting: Vascular Surgery

## 2017-12-09 ENCOUNTER — Ambulatory Visit (HOSPITAL_COMMUNITY): Payer: PPO | Admitting: Anesthesiology

## 2017-12-09 ENCOUNTER — Observation Stay (HOSPITAL_COMMUNITY)
Admission: RE | Admit: 2017-12-09 | Discharge: 2017-12-10 | Disposition: A | Discharge status: Home / Self Care | Payer: PPO | Source: Ambulatory Visit | Attending: Orthopedic Surgery | Admitting: Orthopedic Surgery

## 2017-12-09 ENCOUNTER — Observation Stay (HOSPITAL_COMMUNITY): Payer: PPO

## 2017-12-09 ENCOUNTER — Encounter (HOSPITAL_COMMUNITY): Payer: Self-pay | Admitting: Anesthesiology

## 2017-12-09 DIAGNOSIS — Z471 Aftercare following joint replacement surgery: Secondary | ICD-10-CM | POA: Diagnosis not present

## 2017-12-09 DIAGNOSIS — Z791 Long term (current) use of non-steroidal anti-inflammatories (NSAID): Secondary | ICD-10-CM | POA: Diagnosis not present

## 2017-12-09 DIAGNOSIS — E7800 Pure hypercholesterolemia, unspecified: Secondary | ICD-10-CM | POA: Diagnosis not present

## 2017-12-09 DIAGNOSIS — Z87891 Personal history of nicotine dependence: Secondary | ICD-10-CM | POA: Insufficient documentation

## 2017-12-09 DIAGNOSIS — M109 Gout, unspecified: Secondary | ICD-10-CM | POA: Diagnosis not present

## 2017-12-09 DIAGNOSIS — G4730 Sleep apnea, unspecified: Secondary | ICD-10-CM | POA: Insufficient documentation

## 2017-12-09 DIAGNOSIS — Z801 Family history of malignant neoplasm of trachea, bronchus and lung: Secondary | ICD-10-CM | POA: Insufficient documentation

## 2017-12-09 DIAGNOSIS — I10 Essential (primary) hypertension: Secondary | ICD-10-CM | POA: Insufficient documentation

## 2017-12-09 DIAGNOSIS — Z96659 Presence of unspecified artificial knee joint: Secondary | ICD-10-CM

## 2017-12-09 DIAGNOSIS — Z9889 Other specified postprocedural states: Secondary | ICD-10-CM | POA: Diagnosis not present

## 2017-12-09 DIAGNOSIS — Z96651 Presence of right artificial knee joint: Secondary | ICD-10-CM

## 2017-12-09 DIAGNOSIS — K219 Gastro-esophageal reflux disease without esophagitis: Secondary | ICD-10-CM | POA: Diagnosis not present

## 2017-12-09 DIAGNOSIS — C320 Malignant neoplasm of glottis: Secondary | ICD-10-CM | POA: Diagnosis not present

## 2017-12-09 DIAGNOSIS — Z79899 Other long term (current) drug therapy: Secondary | ICD-10-CM | POA: Insufficient documentation

## 2017-12-09 DIAGNOSIS — M1712 Unilateral primary osteoarthritis, left knee: Principal | ICD-10-CM | POA: Insufficient documentation

## 2017-12-09 DIAGNOSIS — Z923 Personal history of irradiation: Secondary | ICD-10-CM | POA: Diagnosis not present

## 2017-12-09 DIAGNOSIS — Z87442 Personal history of urinary calculi: Secondary | ICD-10-CM | POA: Diagnosis not present

## 2017-12-09 DIAGNOSIS — G8918 Other acute postprocedural pain: Secondary | ICD-10-CM | POA: Diagnosis not present

## 2017-12-09 DIAGNOSIS — M1711 Unilateral primary osteoarthritis, right knee: Secondary | ICD-10-CM | POA: Diagnosis present

## 2017-12-09 DIAGNOSIS — Z8521 Personal history of malignant neoplasm of larynx: Secondary | ICD-10-CM | POA: Insufficient documentation

## 2017-12-09 HISTORY — PX: PARTIAL KNEE ARTHROPLASTY: SHX2174

## 2017-12-09 HISTORY — DX: Unilateral primary osteoarthritis, right knee: M17.11

## 2017-12-09 SURGERY — ARTHROPLASTY, KNEE, UNICOMPARTMENTAL
Anesthesia: Monitor Anesthesia Care | Site: Knee | Laterality: Right

## 2017-12-09 MED ORDER — ONDANSETRON HCL 4 MG/2ML IJ SOLN
INTRAMUSCULAR | Status: AC
  Filled 2017-12-09: qty 2

## 2017-12-09 MED ORDER — PROPOFOL 500 MG/50ML IV EMUL
INTRAVENOUS | Status: DC | PRN
  Administered 2017-12-09: 50 ug/kg/min via INTRAVENOUS

## 2017-12-09 MED ORDER — CEFAZOLIN SODIUM-DEXTROSE 2-4 GM/100ML-% IV SOLN
2.0000 g | Freq: Four times a day (QID) | INTRAVENOUS | Status: AC
  Administered 2017-12-09 (×2): 2 g via INTRAVENOUS
  Filled 2017-12-09 (×2): qty 100

## 2017-12-09 MED ORDER — RIVAROXABAN 10 MG PO TABS
10.0000 mg | ORAL_TABLET | Freq: Every day | ORAL | 0 refills | Status: DC
Start: 2017-12-09 — End: 2018-03-20

## 2017-12-09 MED ORDER — ROCURONIUM BROMIDE 10 MG/ML (PF) SYRINGE
PREFILLED_SYRINGE | INTRAVENOUS | Status: AC
  Filled 2017-12-09: qty 5

## 2017-12-09 MED ORDER — DIPHENHYDRAMINE HCL 12.5 MG/5ML PO ELIX: 12.5000 mg | ORAL_SOLUTION | ORAL | Status: DC | PRN

## 2017-12-09 MED ORDER — BISACODYL 10 MG RE SUPP: 10.0000 mg | Freq: Every day | RECTAL | Status: DC | PRN

## 2017-12-09 MED ORDER — SENNA 8.6 MG PO TABS
1.0000 | ORAL_TABLET | Freq: Every evening | ORAL | 1 refills | Status: DC | PRN
Start: 2017-12-09 — End: 2018-03-20

## 2017-12-09 MED ORDER — BUPIVACAINE HCL (PF) 0.25 % IJ SOLN
INTRAMUSCULAR | Status: DC | PRN
  Administered 2017-12-09: 30 mL

## 2017-12-09 MED ORDER — ALUM & MAG HYDROXIDE-SIMETH 200-200-20 MG/5ML PO SUSP: 30.0000 mL | ORAL | Status: DC | PRN

## 2017-12-09 MED ORDER — SODIUM CHLORIDE 0.9 % IR SOLN
Status: DC | PRN
  Administered 2017-12-09: 3000 mL

## 2017-12-09 MED ORDER — FENTANYL CITRATE (PF) 100 MCG/2ML IJ SOLN
100.0000 ug | Freq: Once | INTRAMUSCULAR | Status: AC
  Administered 2017-12-09: 100 ug via INTRAVENOUS
  Filled 2017-12-09: qty 2

## 2017-12-09 MED ORDER — MIDAZOLAM HCL 5 MG/5ML IJ SOLN
INTRAMUSCULAR | Status: DC | PRN
  Administered 2017-12-09: 2 mg via INTRAVENOUS

## 2017-12-09 MED ORDER — IRBESARTAN 300 MG PO TABS
300.0000 mg | ORAL_TABLET | Freq: Every day | ORAL | Status: DC
  Administered 2017-12-09 – 2017-12-10 (×2): 300 mg via ORAL
  Filled 2017-12-09 (×3): qty 1

## 2017-12-09 MED ORDER — MENTHOL 3 MG MT LOZG: 1.0000 | LOZENGE | OROMUCOSAL | Status: DC | PRN

## 2017-12-09 MED ORDER — ONDANSETRON HCL 4 MG PO TABS: 4.0000 mg | ORAL_TABLET | Freq: Four times a day (QID) | ORAL | Status: DC | PRN

## 2017-12-09 MED ORDER — BUPIVACAINE-EPINEPHRINE (PF) 0.5% -1:200000 IJ SOLN
INTRAMUSCULAR | Status: DC | PRN
  Administered 2017-12-09: 30 mL via PERINEURAL

## 2017-12-09 MED ORDER — BUPIVACAINE HCL (PF) 0.25 % IJ SOLN
INTRAMUSCULAR | Status: AC
  Filled 2017-12-09: qty 30

## 2017-12-09 MED ORDER — SENNA 8.6 MG PO TABS
1.0000 | ORAL_TABLET | Freq: Two times a day (BID) | ORAL | Status: DC
  Administered 2017-12-09 – 2017-12-10 (×2): 8.6 mg via ORAL
  Filled 2017-12-09 (×2): qty 1

## 2017-12-09 MED ORDER — ATORVASTATIN CALCIUM 20 MG PO TABS
40.0000 mg | ORAL_TABLET | Freq: Every day | ORAL | Status: DC
  Administered 2017-12-09 – 2017-12-10 (×2): 40 mg via ORAL
  Filled 2017-12-09 (×2): qty 2

## 2017-12-09 MED ORDER — METOCLOPRAMIDE HCL 5 MG PO TABS: 5.0000 mg | ORAL_TABLET | Freq: Three times a day (TID) | ORAL | Status: DC | PRN

## 2017-12-09 MED ORDER — SUGAMMADEX SODIUM 200 MG/2ML IV SOLN
INTRAVENOUS | Status: AC
  Filled 2017-12-09: qty 2

## 2017-12-09 MED ORDER — OXYCODONE HCL 5 MG PO TABS
10.0000 mg | ORAL_TABLET | ORAL | Status: DC | PRN
  Administered 2017-12-09: 10 mg via ORAL
  Filled 2017-12-09: qty 2

## 2017-12-09 MED ORDER — BUPIVACAINE IN DEXTROSE 0.75-8.25 % IT SOLN
INTRATHECAL | Status: DC | PRN
  Administered 2017-12-09: 15 mg via INTRATHECAL

## 2017-12-09 MED ORDER — PROMETHAZINE HCL 25 MG/ML IJ SOLN: 6.2500 mg | INTRAMUSCULAR | Status: DC | PRN

## 2017-12-09 MED ORDER — CEFAZOLIN SODIUM-DEXTROSE 2-4 GM/100ML-% IV SOLN
INTRAVENOUS | Status: AC
  Filled 2017-12-09: qty 100

## 2017-12-09 MED ORDER — MIDAZOLAM HCL 2 MG/2ML IJ SOLN
2.0000 mg | Freq: Once | INTRAMUSCULAR | Status: AC
  Administered 2017-12-09: 2 mg via INTRAVENOUS
  Filled 2017-12-09: qty 2

## 2017-12-09 MED ORDER — ONDANSETRON HCL 4 MG/2ML IJ SOLN: 4.0000 mg | Freq: Four times a day (QID) | INTRAMUSCULAR | Status: DC | PRN

## 2017-12-09 MED ORDER — POLYETHYLENE GLYCOL 3350 17 G PO PACK: 17.0000 g | PACK | Freq: Every day | ORAL | Status: DC | PRN

## 2017-12-09 MED ORDER — METOCLOPRAMIDE HCL 5 MG/ML IJ SOLN: 5.0000 mg | Freq: Three times a day (TID) | INTRAMUSCULAR | Status: DC | PRN

## 2017-12-09 MED ORDER — ACETAMINOPHEN 650 MG RE SUPP: 650.0000 mg | RECTAL | Status: DC | PRN

## 2017-12-09 MED ORDER — MIDAZOLAM HCL 2 MG/2ML IJ SOLN
INTRAMUSCULAR | Status: AC
  Administered 2017-12-09: 2 mg via INTRAVENOUS
  Filled 2017-12-09: qty 2

## 2017-12-09 MED ORDER — DEXAMETHASONE SODIUM PHOSPHATE 10 MG/ML IJ SOLN
INTRAMUSCULAR | Status: AC
  Filled 2017-12-09: qty 1

## 2017-12-09 MED ORDER — PHENYLEPHRINE HCL 10 MG/ML IJ SOLN
INTRAVENOUS | Status: DC | PRN
  Administered 2017-12-09: 25 ug/min via INTRAVENOUS

## 2017-12-09 MED ORDER — RIVAROXABAN 10 MG PO TABS
10.0000 mg | ORAL_TABLET | Freq: Every day | ORAL | Status: DC
  Administered 2017-12-10: 10 mg via ORAL
  Filled 2017-12-09: qty 1

## 2017-12-09 MED ORDER — ONDANSETRON HCL 4 MG/2ML IJ SOLN
INTRAMUSCULAR | Status: DC | PRN
  Administered 2017-12-09: 4 mg via INTRAVENOUS

## 2017-12-09 MED ORDER — PHENOL 1.4 % MT LIQD: 1.0000 | OROMUCOSAL | Status: DC | PRN

## 2017-12-09 MED ORDER — OXYCODONE HCL 5 MG PO TABS: 5.0000 mg | ORAL_TABLET | ORAL | 0 refills | Status: DC | PRN

## 2017-12-09 MED ORDER — ACETAMINOPHEN 325 MG PO TABS: 650.0000 mg | ORAL_TABLET | ORAL | Status: DC | PRN

## 2017-12-09 MED ORDER — OXYCODONE HCL 5 MG PO TABS: 5.0000 mg | ORAL_TABLET | ORAL | Status: DC | PRN

## 2017-12-09 MED ORDER — DOCUSATE SODIUM 100 MG PO CAPS
100.0000 mg | ORAL_CAPSULE | Freq: Two times a day (BID) | ORAL | Status: DC
  Administered 2017-12-09 – 2017-12-10 (×2): 100 mg via ORAL
  Filled 2017-12-09 (×2): qty 1

## 2017-12-09 MED ORDER — KETOROLAC TROMETHAMINE 15 MG/ML IJ SOLN
7.5000 mg | Freq: Four times a day (QID) | INTRAMUSCULAR | Status: DC
  Administered 2017-12-09 – 2017-12-10 (×3): 7.5 mg via INTRAVENOUS
  Filled 2017-12-09 (×3): qty 1

## 2017-12-09 MED ORDER — KETOROLAC TROMETHAMINE 30 MG/ML IJ SOLN
INTRAMUSCULAR | Status: AC
  Filled 2017-12-09: qty 1

## 2017-12-09 MED ORDER — DEXAMETHASONE SODIUM PHOSPHATE 10 MG/ML IJ SOLN: 10.0000 mg | Freq: Once | INTRAMUSCULAR | Status: DC

## 2017-12-09 MED ORDER — FENTANYL CITRATE (PF) 250 MCG/5ML IJ SOLN
INTRAMUSCULAR | Status: AC
  Filled 2017-12-09: qty 5

## 2017-12-09 MED ORDER — LACTATED RINGERS IV SOLN
INTRAVENOUS | Status: DC
  Administered 2017-12-09: 09:00:00 via INTRAVENOUS

## 2017-12-09 MED ORDER — DEXAMETHASONE SODIUM PHOSPHATE 10 MG/ML IJ SOLN
INTRAMUSCULAR | Status: DC | PRN
  Administered 2017-12-09: 10 mg via INTRAVENOUS

## 2017-12-09 MED ORDER — ZOLPIDEM TARTRATE 5 MG PO TABS: 5.0000 mg | ORAL_TABLET | Freq: Every evening | ORAL | Status: DC | PRN

## 2017-12-09 MED ORDER — FENTANYL CITRATE (PF) 100 MCG/2ML IJ SOLN
INTRAMUSCULAR | Status: DC | PRN
  Administered 2017-12-09: 50 ug via INTRAVENOUS

## 2017-12-09 MED ORDER — POTASSIUM CHLORIDE IN NACL 20-0.45 MEQ/L-% IV SOLN
INTRAVENOUS | Status: DC
  Filled 2017-12-09: qty 1000

## 2017-12-09 MED ORDER — MIDAZOLAM HCL 2 MG/2ML IJ SOLN
INTRAMUSCULAR | Status: AC
  Filled 2017-12-09: qty 2

## 2017-12-09 MED ORDER — KETOROLAC TROMETHAMINE 30 MG/ML IJ SOLN
INTRAMUSCULAR | Status: DC | PRN
  Administered 2017-12-09: 30 mg

## 2017-12-09 MED ORDER — LIDOCAINE 2% (20 MG/ML) 5 ML SYRINGE
INTRAMUSCULAR | Status: AC
  Filled 2017-12-09: qty 5

## 2017-12-09 MED ORDER — FENTANYL CITRATE (PF) 100 MCG/2ML IJ SOLN
INTRAMUSCULAR | Status: AC
  Administered 2017-12-09: 100 ug via INTRAVENOUS
  Filled 2017-12-09: qty 2

## 2017-12-09 MED ORDER — ONDANSETRON HCL 4 MG PO TABS: 4.0000 mg | ORAL_TABLET | Freq: Three times a day (TID) | ORAL | 0 refills | Status: DC | PRN

## 2017-12-09 MED ORDER — 0.9 % SODIUM CHLORIDE (POUR BTL) OPTIME
TOPICAL | Status: DC | PRN
  Administered 2017-12-09: 1000 mL

## 2017-12-09 MED ORDER — PHENYLEPHRINE 40 MCG/ML (10ML) SYRINGE FOR IV PUSH (FOR BLOOD PRESSURE SUPPORT)
PREFILLED_SYRINGE | INTRAVENOUS | Status: AC
  Filled 2017-12-09: qty 20

## 2017-12-09 MED ORDER — ALLOPURINOL 100 MG PO TABS
200.0000 mg | ORAL_TABLET | Freq: Every day | ORAL | Status: DC
  Administered 2017-12-10: 200 mg via ORAL
  Filled 2017-12-09: qty 2

## 2017-12-09 MED ORDER — HYDROMORPHONE HCL 1 MG/ML IJ SOLN: 0.2500 mg | INTRAMUSCULAR | Status: DC | PRN

## 2017-12-09 MED ORDER — MAGNESIUM CITRATE PO SOLN: 1.0000 | Freq: Once | ORAL | Status: DC | PRN

## 2017-12-09 MED ORDER — ACETAMINOPHEN 500 MG PO TABS
1000.0000 mg | ORAL_TABLET | Freq: Four times a day (QID) | ORAL | Status: DC
  Administered 2017-12-09 – 2017-12-10 (×3): 1000 mg via ORAL
  Filled 2017-12-09 (×3): qty 2

## 2017-12-09 MED ORDER — CEFAZOLIN SODIUM-DEXTROSE 2-4 GM/100ML-% IV SOLN
2.0000 g | INTRAVENOUS | Status: AC
  Administered 2017-12-09: 2 g via INTRAVENOUS

## 2017-12-09 MED ORDER — METHOCARBAMOL 500 MG PO TABS
500.0000 mg | ORAL_TABLET | Freq: Four times a day (QID) | ORAL | Status: DC | PRN
  Administered 2017-12-09: 500 mg via ORAL
  Filled 2017-12-09: qty 1

## 2017-12-09 MED ORDER — BACLOFEN 10 MG PO TABS: 10.0000 mg | ORAL_TABLET | Freq: Three times a day (TID) | ORAL | 0 refills | Status: DC

## 2017-12-09 MED ORDER — CARVEDILOL 25 MG PO TABS
25.0000 mg | ORAL_TABLET | Freq: Every day | ORAL | Status: DC
  Administered 2017-12-09 – 2017-12-10 (×2): 25 mg via ORAL
  Filled 2017-12-09 (×2): qty 1

## 2017-12-09 MED ORDER — METHOCARBAMOL 1000 MG/10ML IJ SOLN
500.0000 mg | Freq: Four times a day (QID) | INTRAVENOUS | Status: DC | PRN
  Filled 2017-12-09: qty 5

## 2017-12-09 MED ORDER — FAMOTIDINE 20 MG PO TABS
20.0000 mg | ORAL_TABLET | Freq: Every day | ORAL | Status: DC
  Administered 2017-12-10: 20 mg via ORAL
  Filled 2017-12-09: qty 1

## 2017-12-09 MED ORDER — HYDROMORPHONE HCL 1 MG/ML IJ SOLN: 0.5000 mg | INTRAMUSCULAR | Status: DC | PRN

## 2017-12-09 SURGICAL SUPPLY — 54 items
BANDAGE ELASTIC 6 VELCRO ST LF (GAUZE/BANDAGES/DRESSINGS) ×2 IMPLANT
BANDAGE ESMARK 6X9 LF (GAUZE/BANDAGES/DRESSINGS) ×1 IMPLANT
BNDG CMPR 9X6 STRL LF SNTH (GAUZE/BANDAGES/DRESSINGS) ×1
BNDG CMPR MED 15X6 ELC VLCR LF (GAUZE/BANDAGES/DRESSINGS) ×1
BNDG ELASTIC 6X15 VLCR STRL LF (GAUZE/BANDAGES/DRESSINGS) ×3 IMPLANT
BNDG ESMARK 6X9 LF (GAUZE/BANDAGES/DRESSINGS) ×3
BOWL SMART MIX CTS (DISPOSABLE) ×3 IMPLANT
CAPT KNEE PARTIAL 2 ×2 IMPLANT
CEMENT BONE R 1X40 (Cement) ×2 IMPLANT
CLOSURE STERI-STRIP 1/2X4 (GAUZE/BANDAGES/DRESSINGS) ×1
CLOSURE WOUND 1/2 X4 (GAUZE/BANDAGES/DRESSINGS) ×1
CLSR STERI-STRIP ANTIMIC 1/2X4 (GAUZE/BANDAGES/DRESSINGS) ×2 IMPLANT
COVER SURGICAL LIGHT HANDLE (MISCELLANEOUS) ×3 IMPLANT
CUFF TOURNIQUET SINGLE 34IN LL (TOURNIQUET CUFF) ×3 IMPLANT
DRAPE EXTREMITY T 121X128X90 (DRAPE) IMPLANT
DRAPE HALF SHEET 40X57 (DRAPES) ×1 IMPLANT
DRAPE U-SHAPE 47X51 STRL (DRAPES) ×3 IMPLANT
DRSG MEPILEX BORDER 4X8 (GAUZE/BANDAGES/DRESSINGS) ×3 IMPLANT
DURAPREP 26ML APPLICATOR (WOUND CARE) ×3 IMPLANT
ELECT CAUTERY BLADE 6.4 (BLADE) ×1 IMPLANT
ELECT REM PT RETURN 9FT ADLT (ELECTROSURGICAL) ×3
ELECTRODE REM PT RTRN 9FT ADLT (ELECTROSURGICAL) ×1 IMPLANT
GLOVE BIOGEL PI ORTHO PRO SZ8 (GLOVE) ×4
GLOVE ORTHO TXT STRL SZ7.5 (GLOVE) ×3 IMPLANT
GLOVE PI ORTHO PRO STRL SZ8 (GLOVE) ×2 IMPLANT
GLOVE SURG ORTHO 8.0 STRL STRW (GLOVE) ×3 IMPLANT
GOWN STRL REUS W/ TWL XL LVL3 (GOWN DISPOSABLE) ×1 IMPLANT
GOWN STRL REUS W/TWL 2XL LVL3 (GOWN DISPOSABLE) ×3 IMPLANT
GOWN STRL REUS W/TWL XL LVL3 (GOWN DISPOSABLE) ×3
HANDPIECE INTERPULSE COAX TIP (DISPOSABLE) ×3
HOOD PEEL AWAY FACE SHEILD DIS (HOOD) ×5 IMPLANT
HOOD PEEL AWAY FLYTE STAYCOOL (MISCELLANEOUS) ×3 IMPLANT
IMMOBILIZER KNEE 22 (SOFTGOODS) ×2 IMPLANT
IMMOBILIZER KNEE 22 UNIV (SOFTGOODS) ×3 IMPLANT
KIT BASIN OR (CUSTOM PROCEDURE TRAY) ×3 IMPLANT
KIT ROOM TURNOVER OR (KITS) ×3 IMPLANT
MANIFOLD NEPTUNE II (INSTRUMENTS) ×3 IMPLANT
NDL HYPO 21X1.5 SAFETY (NEEDLE) IMPLANT
NEEDLE HYPO 21X1.5 SAFETY (NEEDLE) IMPLANT
NS IRRIG 1000ML POUR BTL (IV SOLUTION) ×3 IMPLANT
PACK BLADE SAW RECIP 70 3 PT (BLADE) ×2 IMPLANT
PACK TOTAL JOINT (CUSTOM PROCEDURE TRAY) ×3 IMPLANT
PAD ARMBOARD 7.5X6 YLW CONV (MISCELLANEOUS) ×6 IMPLANT
SET HNDPC FAN SPRY TIP SCT (DISPOSABLE) ×1 IMPLANT
STRIP CLOSURE SKIN 1/2X4 (GAUZE/BANDAGES/DRESSINGS) ×2 IMPLANT
SUCTION FRAZIER HANDLE 10FR (MISCELLANEOUS) ×2
SUCTION TUBE FRAZIER 10FR DISP (MISCELLANEOUS) ×1 IMPLANT
SUT VIC AB 0 CT1 27 (SUTURE) ×3
SUT VIC AB 0 CT1 27XBRD ANBCTR (SUTURE) ×1 IMPLANT
SUT VIC AB 1 CT1 27 (SUTURE) ×3
SUT VIC AB 1 CT1 27XBRD ANBCTR (SUTURE) ×1 IMPLANT
SUT VIC AB 3-0 SH 8-18 (SUTURE) ×3 IMPLANT
SYR CONTROL 10ML LL (SYRINGE) IMPLANT
TOWEL OR 17X26 10 PK STRL BLUE (TOWEL DISPOSABLE) ×3 IMPLANT

## 2017-12-09 NOTE — Anesthesia Procedure Notes (Signed)
Anesthesia Regional Block: Adductor canal block   Pre-Anesthetic Checklist: ,, timeout performed, Correct Patient, Correct Site, Correct Laterality, Correct Procedure, Correct Position, site marked, Risks and benefits discussed,  Surgical consent,  Pre-op evaluation,  At surgeon's request and post-op pain management  Laterality: Right  Prep: chloraprep       Needles:  Injection technique: Single-shot  Needle Type: Stimulator Needle - 80     Needle Length: 10cm  Needle Gauge: 21     Additional Needles:   Narrative:  Start time: 12/09/2017 10:01 AM End time: 12/09/2017 10:11 AM Injection made incrementally with aspirations every 5 mL.  Performed by: Personally

## 2017-12-09 NOTE — Plan of Care (Signed)
  Progressing Safety: Ability to remain free from injury will improve 12/09/2017 2139 - Progressing by Charlena Cross, RN Education: Knowledge of the prescribed therapeutic regimen will improve 12/09/2017 2139 - Progressing by Charlena Cross, RN Activity: Ability to avoid complications of mobility impairment will improve 12/09/2017 2139 - Progressing by Charlena Cross, RN Range of joint motion will improve 12/09/2017 2139 - Progressing by Charlena Cross, RN Pain Management: Pain level will decrease with appropriate interventions 12/09/2017 2139 - Progressing by Charlena Cross, RN

## 2017-12-09 NOTE — Anesthesia Procedure Notes (Signed)
Spinal  Patient location during procedure: OR Start time: 12/09/2017 11:08 AM End time: 12/09/2017 11:16 AM Staffing Anesthesiologist: Duane Boston, MD Performed: anesthesiologist  Preanesthetic Checklist Completed: patient identified, surgical consent, pre-op evaluation, timeout performed, IV checked, risks and benefits discussed and monitors and equipment checked Spinal Block Patient position: sitting Prep: DuraPrep Patient monitoring: cardiac monitor, continuous pulse ox and blood pressure Approach: midline Location: L2-3 Injection technique: single-shot Needle Needle type: Pencan  Needle gauge: 24 G Needle length: 9 cm Additional Notes Functioning IV was confirmed and monitors were applied. Sterile prep and drape, including hand hygiene and sterile gloves were used. The patient was positioned and the spine was prepped. The skin was anesthetized with lidocaine.  Free flow of clear CSF was obtained prior to injecting local anesthetic into the CSF.  The spinal needle aspirated freely following injection.  The needle was carefully withdrawn.  The patient tolerated the procedure well.

## 2017-12-09 NOTE — Transfer of Care (Signed)
Immediate Anesthesia Transfer of Care Note  Patient: Alexander Ramirez  Procedure(s) Performed: UNICOMPARTMENTAL KNEE (Right Knee)  Patient Location: PACU  Anesthesia Type:MAC and Spinal  Level of Consciousness: awake, alert  and oriented  Airway & Oxygen Therapy: Patient Spontanous Breathing and Patient connected to face mask oxygen  Post-op Assessment: Report given to RN and Post -op Vital signs reviewed and stable  Post vital signs: Reviewed and stable  Last Vitals:  Vitals:   12/09/17 0850 12/09/17 1309  BP: 109/61 113/68  Pulse: 62 61  Resp: 18 16  Temp: 36.7 C 36.7 C  SpO2: 96% 100%    Last Pain:  Vitals:   12/09/17 1309  TempSrc:   PainSc: (P) 0-No pain      Patients Stated Pain Goal: 0 (15/17/61 6073)  Complications: No apparent anesthesia complications

## 2017-12-09 NOTE — H&P (Addendum)
PREOPERATIVE H&P  Chief Complaint: djd right knee  HPI: Alexander Ramirez is a 67 y.o. male who presents for preoperative history and physical with a diagnosis of djd right knee. Symptoms are rated as moderate to severe, and have been worsening.  This is significantly impairing activities of daily living.  He has elected for surgical management.   He has failed injections, activity modification, anti-inflammatories, and assistive devices.  Preoperative X-rays demonstrate end stage degenerative changes with osteophyte formation, loss of joint space, subchondral sclerosis.   Past Medical History:  Diagnosis Date  . Arthritis   . Cancer (HCC)    throat cancer  . GERD (gastroesophageal reflux disease)    OTC meds  . Gout   . History of radiation therapy 05/26/2017- 07/02/2017   Larynx 63 Gy in 28 fractions.   . Hypercholesteremia   . Hypertension   . Kidney stones   . Sleep apnea    uses CPAP nightly   Past Surgical History:  Procedure Laterality Date  . HERNIA REPAIR    . KIDNEY STONE SURGERY    . MICROLARYNGOSCOPY WITH CO2 LASER AND EXCISION OF VOCAL CORD LESION Left 04/28/2017   Procedure: MICROLARYNGOSCOPY WITH EXCISION LEFT VOCAL CORD MASS WITH CO2 LASER;  Surgeon: Izora Gala, MD;  Location: [facility];  Service: ENT;  Laterality: Left;  . TONSILLECTOMY    . UVULOPALATOPHARYNGOPLASTY (UPPP)/TONSILLECTOMY/SEPTOPLASTY     for sleep apnea   Social History   Socioeconomic History  . Marital status: Married    Spouse name: None  . Number of children: None  . Years of education: None  . Highest education level: None  Social Needs  . Financial resource strain: None  . Food insecurity - worry: None  . Food insecurity - inability: None  . Transportation needs - medical: None  . Transportation needs - non-medical: None  Occupational History  . None  Tobacco Use  . Smoking status: Former Research scientist (life sciences)  . Smokeless tobacco: Never Used  Substance and Sexual Activity   . Alcohol use: Yes    Comment: social  . Drug use: No    Comment: occas  . Sexual activity: None  Other Topics Concern  . None  Social History Narrative  . None   Family History  Problem Relation Age of Onset  . Lung cancer Father        Passed at the age of 87   No Known Allergies Prior to Admission medications   Medication Sig Start Date End Date Taking? Authorizing Provider  allopurinol (ZYLOPRIM) 100 MG tablet Take 200 mg by mouth daily.   Yes [provider]  atorvastatin (LIPITOR) 40 MG tablet Take 40 mg by mouth daily.   Yes [provider]  carvedilol (COREG) 25 MG tablet Take 25 mg by mouth daily.   Yes [provider]  ibuprofen (ADVIL,MOTRIN) 200 MG tablet Take 600 mg by mouth daily as needed for moderate pain.   Yes [provider]  meloxicam (MOBIC) 15 MG tablet Take 15 mg by mouth daily.   Yes [provider]  ranitidine (ZANTAC) 150 MG tablet Take 150 mg by mouth daily as needed for heartburn.   Yes [provider]  telmisartan (MICARDIS) 80 MG tablet Take 80 mg by mouth daily.   Yes [provider]  HYDROcodone-acetaminophen (HYCET) 7.5-325 mg/15 ml solution Take 10-15 mLs by mouth every 4 (four) hours as needed for moderate pain. Take with food. Patient not taking: Reported on 07/16/2017 06/30/17  Gery Pray, MD  lidocaine (XYLOCAINE) 2 % solution Patient: Mix 1part 2% viscous lidocaine, 1part H20. Swallow 46mL of diluted mixture, 3min before meals and at bedtime, up to QID Patient not taking: Reported on 07/16/2017 05/26/17   Eppie Gibson, MD  senna (SENOKOT) 8.6 MG TABS tablet Take 1 tablet (8.6 mg total) by mouth at bedtime as needed for mild constipation. Patient not taking: Reported on 07/16/2017 06/09/17   Eppie Gibson, MD     Positive ROS: All other systems have been reviewed and were otherwise negative with the exception of those mentioned in the HPI and as above.  Physical Exam: General:  Alert, no acute distress Cardiovascular: No pedal edema Respiratory: No cyanosis, no use of accessory musculature GI: No organomegaly, abdomen is soft and non-tender Skin: No lesions in the area of chief complaint Neurologic: Sensation intact distally Psychiatric: Patient is competent for consent with normal mood and affect Lymphatic: No axillary or cervical lymphadenopathy  MUSCULOSKELETAL: right knee has active motion 10 degrees to 115 degrees with positive crepitance and pseudolaxity, pain much more medially than laterally.  Assessment: djd right knee   Plan: Plan for Procedure(s): UNICOMPARTMENTAL KNEE versus total knee replacement  The risks benefits and alternatives were discussed with the patient including but not limited to the risks of nonoperative treatment, versus surgical intervention including infection, bleeding, nerve injury,  blood clots, cardiopulmonary complications, morbidity, mortality, among others, and they were willing to proceed.   Johnny Bridge, MD Cell [phone]   12/09/2017 10:06 AM

## 2017-12-09 NOTE — Progress Notes (Signed)
Attempted to get patient up to ambulate in order to transfer patient to 3C10. Able to wiggle toes, bend up non-operative left knee off bed, and  has sensation in BLE- however legs buckling on patient upon standing and unable to safely move to his room at this time. Will continue to wait until spinal wears off

## 2017-12-09 NOTE — Anesthesia Procedure Notes (Signed)
Procedure Name: MAC Date/Time: 12/09/2017 11:46 AM Performed by: Lieutenant Diego, CRNA Pre-anesthesia Checklist: Patient identified, Emergency Drugs available, Suction available, Patient being monitored and Timeout performed Patient Re-evaluated:Patient Re-evaluated prior to induction Oxygen Delivery Method: Simple face mask Preoxygenation: Pre-oxygenation with 100% oxygen Induction Type: IV induction

## 2017-12-09 NOTE — Anesthesia Postprocedure Evaluation (Signed)
Anesthesia Post Note  Patient: Alexander Ramirez  Procedure(s) Performed: UNICOMPARTMENTAL KNEE (Right Knee)     Patient location during evaluation: PACU Anesthesia Type: MAC and Spinal Level of consciousness: awake and alert Pain management: pain level controlled Vital Signs Assessment: post-procedure vital signs reviewed and stable Respiratory status: spontaneous breathing and respiratory function stable Cardiovascular status: blood pressure returned to baseline and stable Postop Assessment: spinal receding Anesthetic complications: no    Last Vitals:  Vitals:   12/09/17 1640 12/09/17 1653  BP: (!) 140/92 (!) 157/84  Pulse: 73 70  Resp: 16 18  Temp:    SpO2: 99% 98%    Last Pain:  Vitals:   12/09/17 1632  TempSrc:   PainSc: 0-No pain                 [name] DANIEL

## 2017-12-09 NOTE — Anesthesia Preprocedure Evaluation (Addendum)
Anesthesia Evaluation  Patient identified by MRN, date of birth, ID band Patient awake    Reviewed: Allergy & Precautions, NPO status , Patient's Chart, lab work & pertinent test results, reviewed documented beta blocker date and time   History of Anesthesia Complications Negative for: history of anesthetic complications  Airway Mallampati: III  TM Distance: >3 FB Neck ROM: Full    Dental no notable dental hx. (+) Dental Advisory Given   Pulmonary sleep apnea and Continuous Positive Airway Pressure Ventilation , former smoker,    Pulmonary exam normal        Cardiovascular hypertension, Pt. on home beta blockers Normal cardiovascular exam     Neuro/Psych negative neurological ROS  negative psych ROS   GI/Hepatic Neg liver ROS, GERD  ,  Endo/Other  Morbid obesity  Renal/GU negative Renal ROS     Musculoskeletal   Abdominal   Peds  Hematology negative hematology ROS (+)   Anesthesia Other Findings   Reproductive/Obstetrics                           Anesthesia Physical Anesthesia Plan  ASA: III  Anesthesia Plan: Spinal and MAC   Post-op Pain Management: GA combined w/ Regional for post-op pain   Induction:   PONV Risk Score and Plan: 2 and Ondansetron and Propofol infusion  Airway Management Planned: Natural Airway and Simple Face Mask  Additional Equipment:   Intra-op Plan:   Post-operative Plan:   Informed Consent: I have reviewed the patients History and Physical, chart, labs and discussed the procedure including the risks, benefits and alternatives for the proposed anesthesia with the patient or authorized representative who has indicated his/her understanding and acceptance.   Dental advisory given  Plan Discussed with: CRNA and Anesthesiologist  Anesthesia Plan Comments:       Anesthesia Quick Evaluation

## 2017-12-09 NOTE — Discharge Instructions (Signed)

## 2017-12-09 NOTE — Op Note (Addendum)
12/09/2017  12:56 PM  PATIENT:  Alexander Ramirez    PRE-OPERATIVE DIAGNOSIS:  Right knee anteromedial knee osteoarthritis  POST-OPERATIVE DIAGNOSIS:  Same  PROCEDURE:  UNICOMPARTMENTAL KNEE replacement, right knee  SURGEON:  Johnny Bridge, MD  PHYSICIAN ASSISTANT: Joya Gaskins, OPA-C, present and scrubbed throughout the case, critical for completion in a timely fashion, and for retraction, instrumentation, and closure.  ANESTHESIA:   Spinal  ESTIMATED BLOOD LOSS: 100 mL  PREOPERATIVE INDICATIONS:  Alexander Ramirez is a  67 y.o. male with a diagnosis of djd right knee who failed conservative measures and elected for surgical management.    The risks benefits and alternatives were discussed with the patient preoperatively including but not limited to the risks of infection, bleeding, nerve injury, cardiopulmonary complications, blood clots, the need for revision surgery, among others, and the patient was willing to proceed.  OPERATIVE IMPLANTS: Biomet Oxford mobile bearing medial compartment arthroplasty femur size medium, tibia size D, bearing size 3.  OPERATIVE FINDINGS: Endstage grade 4 medial compartment osteoarthritis. No significant changes in the lateral or patellofemoral joint.  The ACL was intact.  OPERATIVE PROCEDURE: The patient was brought to the operating room placed in supine position.  Spinal anesthesia was administered. IV antibiotics were given. The lower extremity was placed in the legholder and prepped and draped in usual sterile fashion.  Time out was performed.  The leg was elevated and exsanguinated and the tourniquet was inflated. Anteromedial incision was performed, and I took care to preserve the MCL. Parapatellar incision was carried out, and the osteophytes were excised, along with the medial meniscus and a small portion of the fat pad.  The extra medullary tibial cutting jig was applied, using the spoon and the 23mm G-Clamp and the 2 mm shim, and I took  care to protect the anterior cruciate ligament insertion and the tibial spine. The medial collateral ligament was also protected, and I resected my proximal tibia, matching the anatomic slope.   The proximal tibial bony cut was removed in one piece, and I turned my attention to the femur.  The intramedullary femoral rod was placed using the drill, and then using the appropriate reference, I assembled the femoral jig, setting my posterior cutting block. I resected my posterior femur, used the 0 spigot for the anterior femur, and then measured my gap.   I then used the appropriate mill to match the extension gap to the flexion gap. The second milling was at a 3.  The gaps were then measured again with the appropriate feeler gauges. Once I had balanced flexion and extension gaps, I then completed the preparation of the femur.  I milled off the anterior aspect of the distal femur to prevent impingement. I also exposed the tibia, and selected the above-named component, and then used the cutting jig to prepare the keel slot on the tibia. I also used the awl to curette out the bone to complete the preparation of the keel. The back wall was intact.  I then placed trial components, and it was found to have excellent motion, and appropriate balance.  I then cemented the components into place, cementing the tibia first, removing all excess cement, and then cementing the femur.  All loose cement was removed.  The real polyethylene insert was applied manually, and the knee was taken through functional range of motion, and found to have excellent stability and restoration of joint motion, with excellent balance.  The wounds were irrigated copiously, and the parapatellar tissue  closed with Vicryl, followed by Vicryl for the subcutaneous tissue, with routine closure with Steri-Strips and sterile gauze.  The tourniquet was released, and the patient was awakened and extubated and returned to PACU in stable and  satisfactory condition. There were no complications.

## 2017-12-10 ENCOUNTER — Encounter (HOSPITAL_COMMUNITY): Payer: Self-pay | Admitting: Orthopedic Surgery

## 2017-12-10 DIAGNOSIS — M1712 Unilateral primary osteoarthritis, left knee: Secondary | ICD-10-CM | POA: Diagnosis not present

## 2017-12-10 LAB — BASIC METABOLIC PANEL
Anion gap: 14 (ref 5–15)
BUN: 17 mg/dL (ref 6–20)
CO2: 20 mmol/L — ABNORMAL LOW (ref 22–32)
CREATININE: 0.93 mg/dL (ref 0.61–1.24)
Calcium: 9.2 mg/dL (ref 8.9–10.3)
Chloride: 107 mmol/L (ref 101–111)
GFR calc Af Amer: >60 mL/min (ref >60)
Glucose, Bld: 154 mg/dL — ABNORMAL HIGH (ref 65–99)
Potassium: 4 mmol/L (ref 3.5–5.1)
SODIUM: 141 mmol/L (ref 135–145)

## 2017-12-10 LAB — CBC
HCT: 45.4 % (ref 39.0–52.0)
Hemoglobin: 15.3 g/dL (ref 13.0–17.0)
MCH: 30.5 pg (ref 26.0–34.0)
MCHC: 33.7 g/dL (ref 30.0–36.0)
MCV: 90.6 fL (ref 78.0–100.0)
PLATELETS: 154 10*3/uL (ref 150–400)
RBC: 5.01 MIL/uL (ref 4.22–5.81)
RDW: 13.2 % (ref 11.5–15.5)
WBC: 18.6 10*3/uL — ABNORMAL HIGH (ref 4.0–10.5)

## 2017-12-10 NOTE — Progress Notes (Signed)
Patient ID: Alexander Ramirez, male   DOB: [birth year], 67 y.o.   MRN: [id number]     Subjective:  Patient reports pain as mild.  Patient in bed and in no acute distress.  Denies any CP or SOB  Objective:   VITALS:   Vitals:   12/09/17 1950 12/09/17 2320 12/09/17 2350 12/10/17 0320  BP: (!) 148/65 139/72  136/87  Pulse: 80 75 80 69  Resp: 16 18 14 18   Temp: 98.1 F (36.7 C) (!) 97.5 F (36.4 C)  98.1 F (36.7 C)  TempSrc: Oral Oral  Oral  SpO2: 97% 97% 98% 100%  Weight:      Height:        ABD soft Sensation intact distally Dorsiflexion/Plantar flexion intact Incision: dressing C/D/I and no drainage   Lab Results  Component Value Date   WBC 7.4 11/28/2017   HGB 15.3 11/28/2017   HCT 46.6 11/28/2017   MCV 92.5 11/28/2017   PLT 162 11/28/2017   BMET    Component Value Date/Time   NA 141 11/28/2017 1100   NA 143 10/31/2017 0926   K 4.2 11/28/2017 1100   K 4.6 10/31/2017 0926   CL 108 11/28/2017 1100   CO2 24 11/28/2017 1100   CO2 23 10/31/2017 0926   GLUCOSE 94 11/28/2017 1100   GLUCOSE 80 10/31/2017 0926   BUN 16 11/28/2017 1100   BUN 25.4 10/31/2017 0926   CREATININE 0.86 11/28/2017 1100   CREATININE 0.8 10/31/2017 0926   CALCIUM 9.1 11/28/2017 1100   CALCIUM 9.4 10/31/2017 0926   GFRNONAA >60 11/28/2017 1100   GFRAA >60 11/28/2017 1100     Assessment/Plan: 1 Day Post-Op   Principal Problem:   Primary localized osteoarthritis of right knee Active Problems:   S/P knee replacement   Advance diet Up with therapy WBAT Dry dressing PRN DC home Follow up with Dr Mardelle Matte  As scheduled   Remonia Richter 12/10/2017, 8:14 AM  Seen and agree with above.   Marchia Bond, MD Cell [phone]

## 2017-12-10 NOTE — Discharge Summary (Signed)
Physician Discharge Summary  Patient ID: Alexander Ramirez MRN: [id number] DOB/AGE: 67/01/1951 67 y.o.  Admit date: 12/09/2017 Discharge date: 12/10/2017  Admission Diagnoses:  Primary localized osteoarthritis of right knee  Discharge Diagnoses:  Principal Problem:   Primary localized osteoarthritis of right knee Active Problems:   S/P knee replacement   Past Medical History:  Diagnosis Date  . Arthritis   . Cancer (HCC)    throat cancer  . GERD (gastroesophageal reflux disease)    OTC meds  . Gout   . History of radiation therapy 05/26/2017- 07/02/2017   Larynx 63 Gy in 28 fractions.   . Hypercholesteremia   . Hypertension   . Kidney stones   . Primary localized osteoarthritis of right knee 12/09/2017  . Sleep apnea    uses CPAP nightly    Surgeries: Procedure(s): UNICOMPARTMENTAL KNEE on 12/09/2017   Consultants (if any):   Discharged Condition: Improved  Hospital Course: Alexander Ramirez is an 67 y.o. male who was admitted 12/09/2017 with a diagnosis of Primary localized osteoarthritis of right knee and went to the operating room on 12/09/2017 and underwent the above named procedures.    He was given perioperative antibiotics:  Anti-infectives (From admission, onward)   Start     Dose/Rate Route Frequency Ordered Stop   12/09/17 1800  ceFAZolin (ANCEF) IVPB 2g/100 mL premix     2 g 200 mL/hr over 30 Minutes Intravenous Every 6 hours 12/09/17 1411 12/09/17 2356   12/09/17 0825  ceFAZolin (ANCEF) IVPB 2g/100 mL premix     2 g 200 mL/hr over 30 Minutes Intravenous On call to O.R. 12/09/17 0825 12/09/17 1135    .  He was given sequential compression devices, early ambulation, and xarelto for DVT prophylaxis.  He benefited maximally from the hospital stay and there were no complications.    Recent vital signs:  Vitals:   12/10/17 0320 12/10/17 0831  BP: 136/87 138/79  Pulse: 69 74  Resp: 18 18  Temp: 98.1 F (36.7 C) 97.9 F (36.6 C)  SpO2: 100% 98%     Recent laboratory studies:  Lab Results  Component Value Date   HGB 15.3 12/10/2017   HGB 15.3 11/28/2017   HGB 14.9 10/31/2017   Lab Results  Component Value Date   WBC 18.6 (H) 12/10/2017   PLT 154 12/10/2017   No results found for: INR Lab Results  Component Value Date   NA 141 11/28/2017   K 4.2 11/28/2017   CL 108 11/28/2017   CO2 24 11/28/2017   BUN 16 11/28/2017   CREATININE 0.86 11/28/2017   GLUCOSE 94 11/28/2017    Discharge Medications:   Allergies as of 12/10/2017   No Known Allergies     Medication List    STOP taking these medications   HYDROcodone-acetaminophen 7.5-325 mg/15 ml solution Commonly known as:  HYCET   ibuprofen 200 MG tablet Commonly known as:  ADVIL,MOTRIN   meloxicam 15 MG tablet Commonly known as:  MOBIC     TAKE these medications   allopurinol 100 MG tablet Commonly known as:  ZYLOPRIM Take 200 mg by mouth daily.   atorvastatin 40 MG tablet Commonly known as:  LIPITOR Take 40 mg by mouth daily.   baclofen 10 MG tablet Commonly known as:  LIORESAL Take 1 tablet (10 mg total) by mouth 3 (three) times daily. As needed for muscle spasm   carvedilol 25 MG tablet Commonly known as:  COREG Take 25 mg by mouth daily.  lidocaine 2 % solution Commonly known as:  XYLOCAINE Patient: Mix 1part 2% viscous lidocaine, 1part H20. Swallow 42mL of diluted mixture, 59min before meals and at bedtime, up to QID   ondansetron 4 MG tablet Commonly known as:  ZOFRAN Take 1 tablet (4 mg total) by mouth every 8 (eight) hours as needed for nausea or vomiting.   oxyCODONE 5 MG immediate release tablet Commonly known as:  ROXICODONE Take 1 tablet (5 mg total) by mouth every 4 (four) hours as needed for severe pain.   ranitidine 150 MG tablet Commonly known as:  ZANTAC Take 150 mg by mouth daily as needed for heartburn.   rivaroxaban 10 MG Tabs tablet Commonly known as:  XARELTO Take 1 tablet (10 mg total) by mouth daily.   senna  8.6 MG Tabs tablet Commonly known as:  SENOKOT Take 1 tablet (8.6 mg total) by mouth at bedtime as needed for mild constipation.   telmisartan 80 MG tablet Commonly known as:  MICARDIS Take 80 mg by mouth daily.       Diagnostic Studies: Dg Knee Right Port  Result Date: 12/09/2017 CLINICAL DATA:  Status post partial right knee replacement. EXAM: PORTABLE RIGHT KNEE - 1-2 VIEW COMPARISON:  None in PACs FINDINGS: The patient has undergone replacement of the medial joint components. The radiographic positioning of the prosthetic components is good. There is a small amount of fluid in the anterior aspect of the joint space. IMPRESSION: No immediate postprocedure complication following partial right knee joint replacement. Electronically Signed   By: David  Martinique M.D.   On: 12/09/2017 13:42    Disposition: 01-Home or Self Care    Follow-up Information    Marchia Bond, MD. Schedule an appointment as soon as possible for a visit in 2 week(s).   Specialty:  Orthopedic Surgery Contact information: [address] [place] [address] [phone]            Signed: Johnny Bridge 12/10/2017, 10:30 AM

## 2017-12-10 NOTE — Evaluation (Addendum)
Occupational Therapy Evaluation and Discharge Patient Details Name: Alexander Ramirez MRN: [id number] DOB: November 22, 1950 Today's Date: 12/10/2017    History of Present Illness Pt is a 67 y/o male who presents s/p L unicompartmental knee replacement on 12/09/17. PMH significant for throat cancer s/p radiation therapy.    Clinical Impression   PTA, pt was able to complete all ADL and functional mobility with cane at modified independent level. He currently presents with post-operative L knee pain impacting his independence and safety with ADL and functional mobility. Pt currently requires min guard assist for LB ADL and ADL transfers. Per pt report, he is not to shower until his post-operative appointment. Educated on safe shower transfers for after that date and pt demonstrates understanding. Additionally educated pt concerning compensatory ADL strategies to maximize safety and return to independence. All education complete and pt demonstrates understanding of all topics. No further acute OT needs and OT will sign off.     Follow Up Recommendations  No OT follow up;Supervision/Assistance - 24 hour(initial)    Equipment Recommendations  None recommended by OT(has needs met)    Recommendations for Other Services       Precautions / Restrictions Precautions Precautions: Fall;Knee Precaution Comments: Pt educated on knee precautions related to ADL and no pillow under knee.  Restrictions Weight Bearing Restrictions: No      Mobility Bed Mobility Overal bed mobility: Independent             General bed mobility comments: Sitting at EOB on my arrival.   Transfers Overall transfer level: Needs assistance Equipment used: Straight cane Transfers: Sit to/from Stand Sit to Stand: Min guard         General transfer comment: Min guard assist for stability    Balance Overall balance assessment: Needs assistance Sitting-balance support: Feet supported;No upper extremity  supported Sitting balance-Leahy Scale: Normal     Standing balance support: No upper extremity supported;During functional activity Standing balance-Leahy Scale: Fair Standing balance comment: Single UE support for dynamic tasks.                            ADL either performed or assessed with clinical judgement   ADL Overall ADL's : Needs assistance/impaired Eating/Feeding: Set up;Sitting   Grooming: Supervision/safety;Standing   Upper Body Bathing: Set up;Sitting   Lower Body Bathing: Min guard;Sit to/from stand   Upper Body Dressing : Set up;Sitting   Lower Body Dressing: Min guard;Sit to/from stand   Toilet Transfer: Min guard;Ambulation(with cane)   Toileting- Clothing Manipulation and Hygiene: Min guard;Sit to/from stand   Tub/ Shower Transfer: Min guard;Ambulation;Shower Scientist, research (medical) Details (indicate cue type and reason): Pt reports that he is not to shower until his post-operative appointment. Reinforced this with pt.  Functional mobility during ADLs: Min guard(cane) General ADL Comments: Pt is able to complete functional mobility with cane at min guard level. Educated concerning compensatory ADL strategies to maximize safety and independence post-acute D/C.      Vision Patient Visual Report: No change from baseline Vision Assessment?: No apparent visual deficits     Perception     Praxis      Pertinent Vitals/Pain Pain Assessment: Faces Faces Pain Scale: Hurts a little bit Pain Location: L knee Pain Descriptors / Indicators: Operative site guarding Pain Intervention(s): Limited activity within patient's tolerance;Monitored during session;Repositioned     Hand Dominance Right   Extremity/Trunk Assessment Upper Extremity Assessment Upper Extremity Assessment: Defer to OT  evaluation   Lower Extremity Assessment Lower Extremity Assessment: RLE deficits/detail RLE Deficits / Details: Decreased strength and ROM as expected  post-operatively.    Cervical / Trunk Assessment Cervical / Trunk Assessment: Normal   Communication Communication Communication: No difficulties   Cognition Arousal/Alertness: Awake/alert Behavior During Therapy: WFL for tasks assessed/performed Overall Cognitive Status: Within Functional Limits for tasks assessed                                     General Comments       Exercises Exercises: Total Joint(Reviewed all HEP with pt and provided handout) Total Joint Exercises Ankle Circles/Pumps: 10 reps Quad Sets: 10 reps Long Arc Quad: 10 reps Knee Flexion: 10 reps Goniometric ROM: 11-112 AROM in sitting   Shoulder Instructions      Home Living Family/patient expects to be discharged to:: Private residence Living Arrangements: Spouse/significant other Available Help at Discharge: Family Type of Home: House Home Access: Stairs to enter     Home Layout: Two level;1/2 bath on main level Alternate Level Stairs-Number of Steps: Flight Alternate Level Stairs-Rails: Left Bathroom Shower/Tub: Tub/shower unit;Walk-in shower   Bathroom Toilet: Standard     Home Equipment: Environmental consultant - 2 wheels;Shower seat;Cane - single point;Toilet riser          Prior Functioning/Environment Level of Independence: Independent with assistive device(s)        Comments: Reports he was independent with the cane but was crawling up the stairs to get to his bedroom/bathroom.        OT Problem List: Decreased strength;Decreased range of motion;Decreased activity tolerance;Impaired balance (sitting and/or standing);Decreased safety awareness;Decreased knowledge of use of DME or AE;Decreased knowledge of precautions;Pain      OT Treatment/Interventions:      OT Goals(Current goals can be found in the care plan section) Acute Rehab OT Goals Patient Stated Goal: Home today OT Goal Formulation: With patient  OT Frequency:     Barriers to D/C:            Co-evaluation               AM-PAC PT "6 Clicks" Daily Activity     Outcome Measure Help from another person eating meals?: None Help from another person taking care of personal grooming?: A Little Help from another person toileting, which includes using toliet, bedpan, or urinal?: A Little Help from another person bathing (including washing, rinsing, drying)?: A Little Help from another person to put on and taking off regular upper body clothing?: None Help from another person to put on and taking off regular lower body clothing?: A Little 6 Click Score: 20   End of Session Equipment Utilized During Treatment: (straight cane) Nurse Communication: Mobility status(OT complete)  Activity Tolerance: Patient tolerated treatment well Patient left: in bed;with call bell/phone within reach;Other (comment)(sitting at [facility] with lab personnel present)  OT Visit Diagnosis: Other abnormalities of gait and mobility (R26.89);Pain Pain - Right/Left: Left Pain - part of body: Knee                Time: 4854-6270 OT Time Calculation (min): 10 min Charges:  OT General Charges $OT Visit: 1 Visit OT Evaluation $OT Eval Low Complexity: 1 Low G-Codes:     Norman Herrlich, MS OTR/L  Pager: [place] A Aris Even 12/10/2017, 12:18 PM

## 2017-12-10 NOTE — Evaluation (Signed)
Physical Therapy Evaluation Patient Details Name: Alexander Ramirez MRN: [id number] DOB: 1951-01-07 Today's Date: 12/10/2017   History of Present Illness  Pt is a 67 y/o male who presents s/p L unicompartmental knee replacement on 12/09/17. PMH significant for throat cancer s/p radiation therapy.   Clinical Impression  Pt admitted with above diagnosis. Pt currently with functional limitations due to the deficits listed below (see PT Problem List). At the time of PT eval pt was able to perform transfers and ambulation with gross supervision for safety with [facility] for support. Pt was educated on stair negotiation and HEP. Per pt request, discussed plans for follow up PT with PA, who recommended outpatient PT to start either late this week or early next week - pt updated. Pt will benefit from skilled PT to increase their independence and safety with mobility to allow discharge to the venue listed below.       Follow Up Recommendations DC plan and follow up therapy as arranged by surgeon;Outpatient PT    Equipment Recommendations  None recommended by PT    Recommendations for Other Services       Precautions / Restrictions Precautions Precautions: Fall;Knee Precaution Comments: Pt was educated on positioning, and NO pillow/ice pack/roll UNDER knee. Encouraged roll under ankle when resting to promote extension.  Restrictions Weight Bearing Restrictions: No      Mobility  Bed Mobility Overal bed mobility: Independent                Transfers Overall transfer level: Needs assistance Equipment used: Straight cane Transfers: Sit to/from Stand Sit to Stand: Supervision         General transfer comment: Pt demonstrated proper hand placement on seated surface for safety. Supervision for safety.   Ambulation/Gait Ambulation/Gait assistance: Supervision Ambulation Distance (Feet): 250 Feet Assistive device: Straight cane Gait Pattern/deviations: Step-through pattern;Decreased stride  length;Trunk flexed Gait velocity: Decreased Gait velocity interpretation: Below normal speed for age/gender General Gait Details: VC's for increased heel strike and more even step/stride length.   Stairs Stairs: Yes Stairs assistance: Min guard;Supervision Stair Management: One rail Left;With cane;Step to pattern;Forwards Number of Stairs: 10 General stair comments: VC's for sequencing and general safety with cane use. Initially supervision for safety with ascending stairs and min guard for descending stairs.   Wheelchair Mobility    Modified Rankin (Stroke Patients Only)       Balance Overall balance assessment: Needs assistance Sitting-balance support: Feet supported;No upper extremity supported Sitting balance-Leahy Scale: Normal     Standing balance support: No upper extremity supported;During functional activity Standing balance-Leahy Scale: Fair                               Pertinent Vitals/Pain Pain Assessment: Faces Faces Pain Scale: Hurts a little bit Pain Location: Knee with end range movement.  Pain Descriptors / Indicators: Operative site guarding Pain Intervention(s): Limited activity within patient's tolerance;Monitored during session;Repositioned    Home Living Family/patient expects to be discharged to:: Private residence Living Arrangements: Spouse/significant other Available Help at Discharge: Family Type of Home: House Home Access: Stairs to enter     Home Layout: Two level;1/2 bath on main level Home Equipment: Walker - 2 wheels;Shower seat;Cane - single point      Prior Function Level of Independence: Independent with assistive device(s)         Comments: Reports he was independent with the cane but was crawling up the stairs to  get to his bedroom/bathroom.     Hand Dominance   Dominant Hand: Right    Extremity/Trunk Assessment   Upper Extremity Assessment Upper Extremity Assessment: Defer to OT evaluation    Lower  Extremity Assessment Lower Extremity Assessment: RLE deficits/detail RLE Deficits / Details: Decreased strength and AROM consistent with above mentioned procedure.     Cervical / Trunk Assessment Cervical / Trunk Assessment: Normal  Communication   Communication: No difficulties  Cognition Arousal/Alertness: Awake/alert Behavior During Therapy: WFL for tasks assessed/performed Overall Cognitive Status: Within Functional Limits for tasks assessed                                        General Comments      Exercises Total Joint Exercises Ankle Circles/Pumps: 10 reps Quad Sets: 10 reps Long Arc Quad: 10 reps Knee Flexion: 10 reps Goniometric ROM: 11-112 AROM in sitting   Assessment/Plan    PT Assessment Patient needs continued PT services  PT Problem List Decreased strength;Decreased range of motion;Decreased activity tolerance;Decreased balance;Decreased mobility;Decreased knowledge of use of DME;Decreased safety awareness;Decreased knowledge of precautions;Pain       PT Treatment Interventions DME instruction;Gait training;Stair training;Functional mobility training;Therapeutic activities;Therapeutic exercise;Neuromuscular re-education;Patient/family education    PT Goals (Current goals can be found in the Care Plan section)  Acute Rehab PT Goals Patient Stated Goal: Home today PT Goal Formulation: With patient Time For Goal Achievement: 12/17/17 Potential to Achieve Goals: Good    Frequency Min 5X/week   Barriers to discharge        Co-evaluation               AM-PAC PT "6 Clicks" Daily Activity  Outcome Measure Difficulty turning over in bed (including adjusting bedclothes, sheets and blankets)?: None Difficulty moving from lying on back to sitting on the side of the bed? : A Little Difficulty sitting down on and standing up from a chair with arms (e.g., wheelchair, bedside commode, etc,.)?: A Little Help needed moving to and from a bed  to chair (including a wheelchair)?: A Little Help needed walking in hospital room?: A Little Help needed climbing 3-5 steps with a railing? : A Little 6 Click Score: 19    End of Session Equipment Utilized During Treatment: Gait belt Activity Tolerance: Patient tolerated treatment well Patient left: with call bell/phone within reach(Sitting EOB) Nurse Communication: Mobility status PT Visit Diagnosis: Unsteadiness on feet (R26.81);Pain;Difficulty in walking, not elsewhere classified (R26.2) Pain - Right/Left: Right Pain - part of body: Knee    Time: 1027-2536 PT Time Calculation (min) (ACUTE ONLY): 39 min   Charges:   PT Evaluation $PT Eval Moderate Complexity: 1 Mod PT Treatments $Gait Training: 8-22 mins $Therapeutic Exercise: 8-22 mins   PT G Codes:        Rolinda Roan, PT, DPT Acute Rehabilitation Services Pager: [phone]   Thelma Comp 12/10/2017, 10:45 AM

## 2017-12-10 NOTE — Progress Notes (Signed)
Patient alert and oriented, mae's well, voiding adequate amount of urine, swallowing without difficulty, no c/o pain at time of discharge. Patient discharged home with family. Script and discharged instructions given to patient. Patient and family stated understanding of instructions given. Patient has an appointment with Dr. Mardelle Matte

## 2017-12-15 DIAGNOSIS — M1711 Unilateral primary osteoarthritis, right knee: Secondary | ICD-10-CM | POA: Diagnosis not present

## 2017-12-18 DIAGNOSIS — M1711 Unilateral primary osteoarthritis, right knee: Secondary | ICD-10-CM | POA: Diagnosis not present

## 2017-12-22 DIAGNOSIS — M1711 Unilateral primary osteoarthritis, right knee: Secondary | ICD-10-CM | POA: Diagnosis not present

## 2017-12-22 DIAGNOSIS — M1712 Unilateral primary osteoarthritis, left knee: Secondary | ICD-10-CM | POA: Diagnosis not present

## 2017-12-26 DIAGNOSIS — M1711 Unilateral primary osteoarthritis, right knee: Secondary | ICD-10-CM | POA: Diagnosis not present

## 2017-12-29 DIAGNOSIS — M1711 Unilateral primary osteoarthritis, right knee: Secondary | ICD-10-CM | POA: Diagnosis not present

## 2018-01-01 DIAGNOSIS — M1711 Unilateral primary osteoarthritis, right knee: Secondary | ICD-10-CM | POA: Diagnosis not present

## 2018-01-05 DIAGNOSIS — E790 Hyperuricemia without signs of inflammatory arthritis and tophaceous disease: Secondary | ICD-10-CM | POA: Diagnosis not present

## 2018-01-05 DIAGNOSIS — I10 Essential (primary) hypertension: Secondary | ICD-10-CM | POA: Diagnosis not present

## 2018-01-05 DIAGNOSIS — Z125 Encounter for screening for malignant neoplasm of prostate: Secondary | ICD-10-CM | POA: Diagnosis not present

## 2018-01-05 DIAGNOSIS — Z0000 Encounter for general adult medical examination without abnormal findings: Secondary | ICD-10-CM | POA: Diagnosis not present

## 2018-01-05 DIAGNOSIS — E7800 Pure hypercholesterolemia, unspecified: Secondary | ICD-10-CM | POA: Diagnosis not present

## 2018-01-05 DIAGNOSIS — E559 Vitamin D deficiency, unspecified: Secondary | ICD-10-CM | POA: Diagnosis not present

## 2018-01-05 DIAGNOSIS — Z79899 Other long term (current) drug therapy: Secondary | ICD-10-CM | POA: Diagnosis not present

## 2018-01-06 DIAGNOSIS — M1711 Unilateral primary osteoarthritis, right knee: Secondary | ICD-10-CM | POA: Diagnosis not present

## 2018-01-09 DIAGNOSIS — M1711 Unilateral primary osteoarthritis, right knee: Secondary | ICD-10-CM | POA: Diagnosis not present

## 2018-01-09 DIAGNOSIS — C329 Malignant neoplasm of larynx, unspecified: Secondary | ICD-10-CM | POA: Diagnosis not present

## 2018-01-13 DIAGNOSIS — M1711 Unilateral primary osteoarthritis, right knee: Secondary | ICD-10-CM | POA: Diagnosis not present

## 2018-01-16 DIAGNOSIS — M1711 Unilateral primary osteoarthritis, right knee: Secondary | ICD-10-CM | POA: Diagnosis not present

## 2018-01-19 DIAGNOSIS — M1712 Unilateral primary osteoarthritis, left knee: Secondary | ICD-10-CM | POA: Diagnosis not present

## 2018-01-22 DIAGNOSIS — G4733 Obstructive sleep apnea (adult) (pediatric): Secondary | ICD-10-CM | POA: Diagnosis not present

## 2018-01-22 DIAGNOSIS — M1711 Unilateral primary osteoarthritis, right knee: Secondary | ICD-10-CM | POA: Diagnosis not present

## 2018-01-29 DIAGNOSIS — M1711 Unilateral primary osteoarthritis, right knee: Secondary | ICD-10-CM | POA: Diagnosis not present

## 2018-02-05 DIAGNOSIS — M1711 Unilateral primary osteoarthritis, right knee: Secondary | ICD-10-CM | POA: Diagnosis not present

## 2018-02-12 DIAGNOSIS — M1711 Unilateral primary osteoarthritis, right knee: Secondary | ICD-10-CM | POA: Diagnosis not present

## 2018-03-17 NOTE — Progress Notes (Signed)
Alexander Ramirez presents for follow up of radiation completed 07/02/17 to his [place]  Pain issues, if any: He has leg and back pain.  Using a feeding tube?: Yes Weight changes, if any:  Wt Readings from Last 3 Encounters:  03/20/18 244 lb 6.4 oz (110.9 kg)  12/09/17 245 lb 14.4 oz (111.5 kg)  11/28/17 245 lb 14.4 oz (111.5 kg)   Swallowing issues, if any: He reports that he feels like something is stuck in his throat. His voice is hoarse and thick sounding. He denies difficulty swallowing.  Smoking or chewing tobacco? He denies Using fluoride trays daily? N/A Last ENT visit was on: Dr. Constance Holster 01/09/18 Other notable issues, if any:  Knee surgery 12/09/17  BP (!) 143/80   Pulse 66   Temp 97.9 F (36.6 C)   Resp 18   Ht 5\' 6"  (1.676 m)   Wt 244 lb 6.4 oz (110.9 kg)   SpO2 99% Comment: room air  BMI 39.45 kg/m

## 2018-03-20 ENCOUNTER — Encounter: Payer: Self-pay | Admitting: Radiation Oncology

## 2018-03-20 ENCOUNTER — Ambulatory Visit
Admission: RE | Admit: 2018-03-20 | Discharge: 2018-03-20 | Disposition: A | Discharge status: Home / Self Care | Payer: PPO | Source: Ambulatory Visit | Attending: Radiation Oncology | Admitting: Radiation Oncology

## 2018-03-20 ENCOUNTER — Other Ambulatory Visit: Payer: Self-pay

## 2018-03-20 VITALS — BP 143/80 | HR 66 | Temp 97.9°F | Resp 18 | Ht 66.0 in | Wt 244.4 lb

## 2018-03-20 DIAGNOSIS — Z08 Encounter for follow-up examination after completed treatment for malignant neoplasm: Secondary | ICD-10-CM | POA: Diagnosis not present

## 2018-03-20 DIAGNOSIS — Z87891 Personal history of nicotine dependence: Secondary | ICD-10-CM | POA: Diagnosis not present

## 2018-03-20 DIAGNOSIS — C320 Malignant neoplasm of glottis: Secondary | ICD-10-CM | POA: Diagnosis not present

## 2018-03-20 DIAGNOSIS — Z923 Personal history of irradiation: Secondary | ICD-10-CM | POA: Diagnosis not present

## 2018-03-20 DIAGNOSIS — Z79899 Other long term (current) drug therapy: Secondary | ICD-10-CM | POA: Diagnosis not present

## 2018-03-20 DIAGNOSIS — R490 Dysphonia: Secondary | ICD-10-CM | POA: Diagnosis not present

## 2018-03-20 DIAGNOSIS — Z8521 Personal history of malignant neoplasm of larynx: Secondary | ICD-10-CM | POA: Diagnosis not present

## 2018-03-20 DIAGNOSIS — J387 Other diseases of larynx: Secondary | ICD-10-CM | POA: Diagnosis not present

## 2018-03-20 DIAGNOSIS — K219 Gastro-esophageal reflux disease without esophagitis: Secondary | ICD-10-CM | POA: Diagnosis not present

## 2018-03-20 MED ORDER — LARYNGOSCOPY SOLUTION RAD-ONC
15.0000 mL | Freq: Once | TOPICAL | Status: AC
  Administered 2018-03-20: 15 mL via TOPICAL
  Filled 2018-03-20: qty 15

## 2018-03-20 NOTE — Progress Notes (Signed)
Radiation Oncology         [phone]) [phone] ________________________________  Name: Alexander Ramirez MRN: [id number]  Date: 03/20/2018  DOB: [birth year]  Follow-Up Visit Note  CC: Colonel Bald, MD  Izora Gala, MD  Diagnosis and Prior Radiotherapy:       ICD-10-CM   1. 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  CHIEF COMPLAINT:  Here for follow-up and surveillance of head and neck cancer  Narrative:  The patient returns today for routine follow-up of radiation completed 9 months ago to his larynx.   Pain issues, if any: He reports leg and back pain.   Weight changes, if any:  Wt Readings from Last 3 Encounters:  03/20/18 244 lb 6.4 oz (110.9 kg)  12/09/17 245 lb 14.4 oz (111.5 kg)  11/28/17 245 lb 14.4 oz (111.5 kg)   Swallowing issues, if any: He reports that he feels like something is stuck in his throat, and he cannot clear his throat. He reports hoarseness that has gotten worse in the past 3 months. He denies difficulty swallowing. He reports dry throat and is using Biotene.  Smoking or chewing tobacco? He denies.  Last ENT visit was on: 01/09/2018 with Dr. Constance Holster. Laryngoscopy done at that time showed that his left vocal cord was erythematous.  Other notable issues, if any: He underwent knee surgery on 12/09/2017.             ALLERGIES:  has No Known Allergies.  Meds: Current Outpatient Medications  Medication Sig Dispense Refill  . allopurinol (ZYLOPRIM) 100 MG tablet Take 200 mg by mouth daily.    Marland Kitchen atorvastatin (LIPITOR) 40 MG tablet Take 40 mg by mouth daily. He is taking 20 mg at this time.    . carvedilol (COREG) 25 MG tablet Take 25 mg by mouth daily.    Marland Kitchen omeprazole (PRILOSEC) 20 MG capsule Take 20 mg by mouth daily.    Marland Kitchen  telmisartan (MICARDIS) 80 MG tablet Take 80 mg by mouth daily.    . baclofen (LIORESAL) 10 MG tablet Take 1 tablet (10 mg total) by mouth 3 (three) times daily. As needed for muscle spasm (Patient not taking: Reported on 03/20/2018) 50 tablet 0  . lidocaine (XYLOCAINE) 2 % solution Patient: Mix 1part 2% viscous lidocaine, 1part H20. Swallow 60mL of diluted mixture, 28min before meals and at bedtime, up to QID (Patient not taking: Reported on 07/16/2017) 100 mL 5  . ondansetron (ZOFRAN) 4 MG tablet Take 1 tablet (4 mg total) by mouth every 8 (eight) hours as needed for nausea or vomiting. (Patient not taking: Reported on 03/20/2018) 10 tablet 0  . oxyCODONE (ROXICODONE) 5 MG immediate release tablet Take 1 tablet (5 mg total) by mouth every 4 (four) hours as needed for severe pain. (Patient not taking: Reported on 03/20/2018) 30 tablet 0   No current facility-administered medications for this encounter.        Physical Findings: Wt Readings from Last 3 Encounters:  03/20/18 244 lb 6.4 oz (110.9 kg)  12/09/17 245 lb 14.4 oz (111.5 kg)  11/28/17 245 lb 14.4 oz (111.5 kg)    height is 5\' 6"  (1.676 m) and weight is 244 lb 6.4 oz (110.9 kg). His temperature is 97.9 F (36.6 C). His blood pressure  is 143/80 (abnormal) and his pulse is 66. His respiration is 18 and oxygen saturation is 99%.   General: Alert and oriented, in no acute distress. HEENT: Oral cavity and oropharynx are clear. Neck: Neck is supple, no palpable cervical or supraclavicular lymphadenopathy.   Skin: The skin over his neck has healed well.  PROCEDURE NOTE: After obtaining consent and anesthetizing the nasal cavity with topical lidocaine and phenylephrine, the flexible endoscope was introduced and passed through the nasal cavity.  Vocal cord on the left has whitish irregular mucosal pattern concerning for recurrence.  Cords are symmetrically mobile, no other lesions appreciated in the pharynx or larynx   Lab Findings: Lab Results    Component Value Date   WBC 18.6 (H) 12/10/2017   HGB 15.3 12/10/2017   HCT 45.4 12/10/2017   MCV 90.6 12/10/2017   PLT 154 12/10/2017    Lab Results  Component Value Date   TSH 1.072 10/31/2017    Radiographic Findings: No results found.  Impression/Plan:    1) Head and Neck Cancer Status: Left vocal cord appears abnormal and suspicious for recurrence on laryngoscopy today. I explained to the patient he should see Dr Constance Holster ASAP.  He and his wife expressed understanding. I talked with Dr. Constance Holster and his staff will call the patient to schedule an appointment for further evaluation ASAP, today if patient is able   2) Nutritional Status: Weight stable.  3) Swallowing: He is not having difficulty with swallowing, but he feels like something is stuck in his throat and cannot clear it. His voice is hoarse and thick sounding and has gotten worse in the past 3 months. He is using Biotene.  4) Thyroid function: Continue routine TSH testing - WNL. If he ends up needing a thyroid supplement, his preferred pharmacy is [facility] in [place], [place]. Lab Results  Component Value Date   TSH 1.072 10/31/2017    5) Other: See Dentistry for teeth problems. Unrelated to RT, which was not overlapping mouth  6) Follow up with Dr. Constance Holster ASAP to check for atypical cells or recurrence. Follow up with me in 6 months. The patient was encouraged to call with any issues or questions before then.  7) History of tobacco use: Follow up with PCP for annual lung cancer screening. Defer to PCP on future studies, pt will discuss with them.   I spent 25 minutes face to face with the patient and more than 50% of that time was spent in counseling and/or coordination of care. _____________________________________   Eppie Gibson, MD  This document serves as a record of services personally performed by Eppie Gibson, MD. It was created on her behalf by Rae Lips, a trained medical scribe. The creation of this  record is based on the scribe's personal observations and the provider's statements to them. This document has been checked and approved by the attending provider.

## 2018-03-21 ENCOUNTER — Encounter: Payer: Self-pay | Admitting: Radiation Oncology

## 2018-03-26 ENCOUNTER — Other Ambulatory Visit: Payer: Self-pay | Admitting: Otolaryngology

## 2018-03-26 DIAGNOSIS — Z923 Personal history of irradiation: Secondary | ICD-10-CM | POA: Diagnosis not present

## 2018-03-26 DIAGNOSIS — K219 Gastro-esophageal reflux disease without esophagitis: Secondary | ICD-10-CM | POA: Diagnosis not present

## 2018-03-26 DIAGNOSIS — C329 Malignant neoplasm of larynx, unspecified: Secondary | ICD-10-CM | POA: Diagnosis not present

## 2018-03-27 ENCOUNTER — Inpatient Hospital Stay (HOSPITAL_COMMUNITY): Admission: RE | Admit: 2018-03-27 | Payer: PPO | Source: Ambulatory Visit

## 2018-03-28 NOTE — Pre-Procedure Instructions (Signed)
DAVYN MORANDI  03/28/2018      [facility] - [place], [place] - [address], [address], [address] [place] [address] Phone: [phone] Fax: [phone]  [id number] - [place], [place] [address] Phone: [phone] Fax: [phone]    Your procedure is scheduled on May 21.  Report to [facility] Admitting at 900 A.M.  Call this number if you have problems the morning of surgery:  [phone]   Remember:  Do not eat food or drink liquids after midnight.  Take these medicines the morning of surgery with A SIP OF WATER allopurinol (Zyloprim), carvedilol (Coreg), Omeprazole (Prilosec)  Stop taking aspirin, BC's, Goody's, Herbal medications, Fish Oil, Aleve, Ibuprofen, Advil, Motrin, Vitamins, Meloxicam (Mobic)    Do not wear jewelry, make-up or nail polish.  Do not wear lotions, powders, or perfumes, or deodorant.  Do not shave 48 hours prior to surgery.  Men may shave face and neck.  Do not bring valuables to the hospital.  [facility] is not responsible for any belongings or valuables.  Contacts, dentures or bridgework may not be worn into surgery.  Leave your suitcase in the car.  After surgery it may be brought to your room.  For patients admitted to the hospital, discharge time will be determined by your treatment team.  Patients discharged the day of surgery will not be allowed to drive home.   Special instructions:   [facility]- Preparing For Surgery  Before surgery, you can play an important role. Because skin is not sterile, your skin needs to be as free of germs as possible. You can reduce the number of germs on your skin by washing with CHG (chlorahexidine gluconate) Soap before surgery.  CHG is an antiseptic cleaner which kills germs and bonds with the skin to continue killing germs even after washing.  Oral Hygiene is also important to reduce your risk of  infection.  Remember - BRUSH YOUR TEETH THE MORNING OF SURGERY  Please do not use if you have an allergy to CHG or antibacterial soaps. If your skin becomes reddened/irritated stop using the CHG.  Do not shave (including legs and underarms) for at least 48 hours prior to first CHG shower. It is OK to shave your face.  Please follow these instructions carefully.   1. Shower the NIGHT BEFORE SURGERY and the MORNING OF SURGERY with CHG.   2. If you chose to wash your hair, wash your hair first as usual with your normal shampoo.  3. After you shampoo, rinse your hair and body thoroughly to remove the shampoo.  4. Use CHG as you would any other liquid soap. You can apply CHG directly to the skin and wash gently with a scrungie or a clean washcloth.   5. Apply the CHG Soap to your body ONLY FROM THE NECK DOWN.  Do not use on open wounds or open sores. Avoid contact with your eyes, ears, mouth and genitals (private parts). Wash Face and genitals (private parts)  with your normal soap.  6. Wash thoroughly, paying special attention to the area where your surgery will be performed.  7. Thoroughly rinse your body with warm water from the neck down.  8. DO NOT shower/wash with your normal soap after using and rinsing off the CHG Soap.  9. Pat yourself dry with a CLEAN TOWEL.  10. Wear CLEAN PAJAMAS to bed the night before  surgery, wear comfortable clothes the morning of surgery  11. Place CLEAN SHEETS on your bed the night of your first shower and DO NOT SLEEP WITH PETS.    Day of Surgery: Do not apply any deodorants/lotions. Please wear clean clothes to the hospital/surgery center.  Remember to brush your teeth.      Please read over the following fact sheets that you were given. Pain Booklet, Coughing and Deep Breathing, MRSA Information and Surgical Site Infection Prevention

## 2018-03-30 ENCOUNTER — Inpatient Hospital Stay (HOSPITAL_COMMUNITY)
Admission: RE | Admit: 2018-03-30 | Discharge: 2018-03-30 | Disposition: A | Discharge status: Home / Self Care | Payer: PPO | Source: Ambulatory Visit

## 2018-03-31 ENCOUNTER — Telehealth: Payer: Self-pay | Admitting: *Deleted

## 2018-03-31 NOTE — Telephone Encounter (Signed)
Oncology Nurse Navigator Documentation  Received call from Mr Carey wife.   She informed he has a recurrence of L vocal cord SCC (he completed RT for same 07/02/17), ENT Dr. Constance Holster has recommended laryngectomy.   She noted they have an appt at [facility] 5/21 for 2nd opinion. She asked if there is a patient who has had a laryngectomy that would be willing to meet with them to share their experience.  I indicated I would arrange. She expressed appreciation for my support.  Gayleen Orem, RN, BSN Head & Neck Oncology Nurse [place] at [place] [phone]

## 2018-04-06 DIAGNOSIS — E559 Vitamin D deficiency, unspecified: Secondary | ICD-10-CM | POA: Diagnosis not present

## 2018-04-06 DIAGNOSIS — E790 Hyperuricemia without signs of inflammatory arthritis and tophaceous disease: Secondary | ICD-10-CM | POA: Diagnosis not present

## 2018-04-06 DIAGNOSIS — R7303 Prediabetes: Secondary | ICD-10-CM | POA: Diagnosis not present

## 2018-04-06 DIAGNOSIS — R5383 Other fatigue: Secondary | ICD-10-CM | POA: Diagnosis not present

## 2018-04-06 DIAGNOSIS — E7800 Pure hypercholesterolemia, unspecified: Secondary | ICD-10-CM | POA: Diagnosis not present

## 2018-04-07 ENCOUNTER — Encounter (HOSPITAL_COMMUNITY): Admission: RE | Payer: Self-pay | Source: Ambulatory Visit

## 2018-04-07 ENCOUNTER — Ambulatory Visit (HOSPITAL_COMMUNITY): Admission: RE | Admit: 2018-04-07 | Payer: PPO | Source: Ambulatory Visit | Admitting: Orthopedic Surgery

## 2018-04-07 DIAGNOSIS — C320 Malignant neoplasm of glottis: Secondary | ICD-10-CM | POA: Diagnosis not present

## 2018-04-07 SURGERY — ARTHROPLASTY, KNEE, UNICOMPARTMENTAL
Anesthesia: Choice | Laterality: Left

## 2018-04-08 ENCOUNTER — Telehealth: Payer: Self-pay | Admitting: *Deleted

## 2018-04-08 NOTE — Telephone Encounter (Signed)
Oncology Nurse Navigator Documentation  Spoke with pt's wife s/p yesterday's [facility] consult per Dr. Janeice Robinson referral. She indicated encouraging discussion held with ENT Dr. [place] Callas who is ordering additional imaging before proposing tmt plan. I informed her I have identified a patient who has had a laryngectomy and is willing to met with them to share his experience should that be the tmt decision.  She expressed appreciation for the opportunity, indicated she will keep me informed as they move forward.  Gayleen Orem, RN, BSN Head & Neck Oncology Nurse [place] at [place] [phone]

## 2018-04-09 DIAGNOSIS — C4492 Squamous cell carcinoma of skin, unspecified: Secondary | ICD-10-CM | POA: Diagnosis not present

## 2018-04-15 DIAGNOSIS — C320 Malignant neoplasm of glottis: Secondary | ICD-10-CM | POA: Diagnosis not present

## 2018-04-15 DIAGNOSIS — K219 Gastro-esophageal reflux disease without esophagitis: Secondary | ICD-10-CM | POA: Diagnosis not present

## 2018-04-15 DIAGNOSIS — I10 Essential (primary) hypertension: Secondary | ICD-10-CM | POA: Diagnosis not present

## 2018-04-15 DIAGNOSIS — Z01818 Encounter for other preprocedural examination: Secondary | ICD-10-CM | POA: Diagnosis not present

## 2018-04-15 DIAGNOSIS — G4730 Sleep apnea, unspecified: Secondary | ICD-10-CM | POA: Diagnosis not present

## 2018-04-22 DIAGNOSIS — Z79899 Other long term (current) drug therapy: Secondary | ICD-10-CM | POA: Diagnosis not present

## 2018-04-22 DIAGNOSIS — D141 Benign neoplasm of larynx: Secondary | ICD-10-CM | POA: Diagnosis not present

## 2018-04-22 DIAGNOSIS — J383 Other diseases of vocal cords: Secondary | ICD-10-CM | POA: Diagnosis not present

## 2018-04-22 DIAGNOSIS — G4733 Obstructive sleep apnea (adult) (pediatric): Secondary | ICD-10-CM | POA: Diagnosis not present

## 2018-04-22 DIAGNOSIS — K219 Gastro-esophageal reflux disease without esophagitis: Secondary | ICD-10-CM | POA: Diagnosis not present

## 2018-04-22 DIAGNOSIS — Z87891 Personal history of nicotine dependence: Secondary | ICD-10-CM | POA: Diagnosis not present

## 2018-04-22 DIAGNOSIS — Z923 Personal history of irradiation: Secondary | ICD-10-CM | POA: Diagnosis not present

## 2018-04-22 DIAGNOSIS — I10 Essential (primary) hypertension: Secondary | ICD-10-CM | POA: Diagnosis not present

## 2018-04-22 DIAGNOSIS — C329 Malignant neoplasm of larynx, unspecified: Secondary | ICD-10-CM | POA: Diagnosis not present

## 2018-04-22 DIAGNOSIS — C320 Malignant neoplasm of glottis: Secondary | ICD-10-CM | POA: Diagnosis not present

## 2018-04-28 DIAGNOSIS — I10 Essential (primary) hypertension: Secondary | ICD-10-CM | POA: Diagnosis not present

## 2018-04-28 DIAGNOSIS — Z87891 Personal history of nicotine dependence: Secondary | ICD-10-CM | POA: Diagnosis not present

## 2018-04-28 DIAGNOSIS — C329 Malignant neoplasm of larynx, unspecified: Secondary | ICD-10-CM | POA: Diagnosis not present

## 2018-04-28 DIAGNOSIS — G4730 Sleep apnea, unspecified: Secondary | ICD-10-CM | POA: Diagnosis not present

## 2018-04-28 DIAGNOSIS — C323 Malignant neoplasm of laryngeal cartilage: Secondary | ICD-10-CM | POA: Diagnosis not present

## 2018-04-28 DIAGNOSIS — M1990 Unspecified osteoarthritis, unspecified site: Secondary | ICD-10-CM | POA: Diagnosis not present

## 2018-04-28 DIAGNOSIS — K219 Gastro-esophageal reflux disease without esophagitis: Secondary | ICD-10-CM | POA: Diagnosis not present

## 2018-04-28 DIAGNOSIS — Z923 Personal history of irradiation: Secondary | ICD-10-CM | POA: Diagnosis not present

## 2018-05-13 DIAGNOSIS — I4581 Long QT syndrome: Secondary | ICD-10-CM | POA: Diagnosis not present

## 2018-05-13 DIAGNOSIS — Z9002 Acquired absence of larynx: Secondary | ICD-10-CM | POA: Diagnosis not present

## 2018-05-13 DIAGNOSIS — J189 Pneumonia, unspecified organism: Secondary | ICD-10-CM | POA: Diagnosis not present

## 2018-05-13 DIAGNOSIS — C329 Malignant neoplasm of larynx, unspecified: Secondary | ICD-10-CM | POA: Diagnosis not present

## 2018-05-13 DIAGNOSIS — Z6841 Body Mass Index (BMI) 40.0 and over, adult: Secondary | ICD-10-CM | POA: Diagnosis not present

## 2018-05-13 DIAGNOSIS — J9811 Atelectasis: Secondary | ICD-10-CM | POA: Diagnosis not present

## 2018-05-13 DIAGNOSIS — I10 Essential (primary) hypertension: Secondary | ICD-10-CM | POA: Diagnosis not present

## 2018-05-13 DIAGNOSIS — Z923 Personal history of irradiation: Secondary | ICD-10-CM | POA: Diagnosis not present

## 2018-05-13 DIAGNOSIS — K219 Gastro-esophageal reflux disease without esophagitis: Secondary | ICD-10-CM | POA: Diagnosis not present

## 2018-05-13 DIAGNOSIS — Z96651 Presence of right artificial knee joint: Secondary | ICD-10-CM | POA: Diagnosis not present

## 2018-05-13 DIAGNOSIS — G4733 Obstructive sleep apnea (adult) (pediatric): Secondary | ICD-10-CM | POA: Diagnosis not present

## 2018-05-13 DIAGNOSIS — Z87891 Personal history of nicotine dependence: Secondary | ICD-10-CM | POA: Diagnosis not present

## 2018-05-13 DIAGNOSIS — R112 Nausea with vomiting, unspecified: Secondary | ICD-10-CM | POA: Diagnosis not present

## 2018-05-13 DIAGNOSIS — Z4682 Encounter for fitting and adjustment of non-vascular catheter: Secondary | ICD-10-CM | POA: Diagnosis not present

## 2018-05-13 DIAGNOSIS — C320 Malignant neoplasm of glottis: Secondary | ICD-10-CM | POA: Diagnosis not present

## 2018-05-27 DIAGNOSIS — Z430 Encounter for attention to tracheostomy: Secondary | ICD-10-CM | POA: Diagnosis not present

## 2018-05-27 DIAGNOSIS — C320 Malignant neoplasm of glottis: Secondary | ICD-10-CM | POA: Diagnosis not present

## 2018-05-27 DIAGNOSIS — Z483 Aftercare following surgery for neoplasm: Secondary | ICD-10-CM | POA: Diagnosis not present

## 2018-05-27 DIAGNOSIS — I10 Essential (primary) hypertension: Secondary | ICD-10-CM | POA: Diagnosis not present

## 2018-05-28 ENCOUNTER — Other Ambulatory Visit: Payer: Self-pay

## 2018-05-28 NOTE — Patient Outreach (Signed)
[facility]) Care Management  05/28/2018  Alexander Ramirez [birth year] [id number]     Transition of Care Referral  Referral Date: 05/28/18  Referral Source: HTA Discharge Report Date of Admission: unknown Diagnosis: unknown Date of Discharge: 05/26/18 Facility: [place]: HTA    Outreach attempt # 1 to patient. No answer at present.   Plan: RN CM will make outreach attempt to patient within 3-4 business days. RN CM will send unsuccessful outreach letter to patient.   [name], RN,BSN,CCM [place] Management Telephonic Care Management Coordinator Direct Phone: [phone] Toll Free: [phone] Fax: [phone]

## 2018-05-29 ENCOUNTER — Other Ambulatory Visit: Payer: Self-pay

## 2018-05-29 NOTE — Patient Outreach (Signed)
[place] [facility]) Care Management  05/29/2018  Alexander Ramirez [birth year] [id number]   Transition of Care Referral  Referral Date: 05/28/18  Referral Source: HTA Discharge Report Date of Admission: unknown Diagnosis: unknown Date of Discharge: 05/26/18 Facility: [place]: HTA    Outreach attempt #2 to patient. No answer at present.     Plan: RN CM will make outreach attempt to patient within 3-4 business days.     [name], RN,BSN,CCM [place] Management Telephonic Care Management Coordinator Direct Phone: [phone] Toll Free: [phone] Fax: [phone]

## 2018-06-01 ENCOUNTER — Other Ambulatory Visit: Payer: Self-pay

## 2018-06-01 NOTE — Patient Outreach (Signed)
[facility]) Care Management  06/01/2018  Alexander Ramirez 07-31-1951 [id number]   Transition of Care Referral  Referral Date:05/28/18 Referral Source:HTA Discharge Report Date of Admission:unknown Diagnosis:unknown Date of Discharge:05/26/18 [place] attempt #3 to patient. No answer at present.     Plan: RN CM will close case if no response from letter mailed to patient.     [name], RN,BSN,CCM [place] Management Telephonic Care Management Coordinator Direct Phone: [phone] Toll Free: [phone] Fax: [phone]

## 2018-06-02 DIAGNOSIS — M259 Joint disorder, unspecified: Secondary | ICD-10-CM | POA: Diagnosis not present

## 2018-06-02 DIAGNOSIS — C329 Malignant neoplasm of larynx, unspecified: Secondary | ICD-10-CM | POA: Diagnosis not present

## 2018-06-02 DIAGNOSIS — Z9002 Acquired absence of larynx: Secondary | ICD-10-CM | POA: Diagnosis not present

## 2018-06-02 DIAGNOSIS — I890 Lymphedema, not elsewhere classified: Secondary | ICD-10-CM | POA: Diagnosis not present

## 2018-06-02 DIAGNOSIS — R491 Aphonia: Secondary | ICD-10-CM | POA: Diagnosis not present

## 2018-06-03 DIAGNOSIS — I10 Essential (primary) hypertension: Secondary | ICD-10-CM | POA: Diagnosis not present

## 2018-06-03 DIAGNOSIS — C320 Malignant neoplasm of glottis: Secondary | ICD-10-CM | POA: Diagnosis not present

## 2018-06-03 DIAGNOSIS — Z483 Aftercare following surgery for neoplasm: Secondary | ICD-10-CM | POA: Diagnosis not present

## 2018-06-03 DIAGNOSIS — Z430 Encounter for attention to tracheostomy: Secondary | ICD-10-CM | POA: Diagnosis not present

## 2018-06-10 ENCOUNTER — Other Ambulatory Visit: Payer: Self-pay

## 2018-06-10 DIAGNOSIS — M259 Joint disorder, unspecified: Secondary | ICD-10-CM | POA: Diagnosis not present

## 2018-06-10 DIAGNOSIS — Z9002 Acquired absence of larynx: Secondary | ICD-10-CM | POA: Diagnosis not present

## 2018-06-10 DIAGNOSIS — I890 Lymphedema, not elsewhere classified: Secondary | ICD-10-CM | POA: Diagnosis not present

## 2018-06-10 NOTE — Patient Outreach (Signed)
[facility]) Care Management  06/10/2018  Alexander Ramirez 08-16-1951 [id number]   Transition of Care Referral  Referral Date:05/28/18 Referral Source:HTA Discharge Report Date of Admission:unknown Diagnosis:unknown Date of Discharge:05/26/18 [facility] Insurance:HTA    Multiple attempts to establish contact with patient without success. No response from letter mailed to patient. Case is being closed at this time.    Plan: RN CM will close case at this time.   [name], RN,BSN,CCM [place] Management Telephonic Care Management Coordinator Direct Phone: [phone] Toll Free: [phone] Fax: [phone]

## 2018-06-12 DIAGNOSIS — R491 Aphonia: Secondary | ICD-10-CM | POA: Diagnosis not present

## 2018-06-12 DIAGNOSIS — Z9002 Acquired absence of larynx: Secondary | ICD-10-CM | POA: Diagnosis not present

## 2018-06-12 DIAGNOSIS — Z449 Encounter for fitting and adjustment of unspecified external prosthetic device: Secondary | ICD-10-CM | POA: Diagnosis not present

## 2018-06-16 DIAGNOSIS — Z8521 Personal history of malignant neoplasm of larynx: Secondary | ICD-10-CM | POA: Diagnosis not present

## 2018-06-16 DIAGNOSIS — R491 Aphonia: Secondary | ICD-10-CM | POA: Diagnosis not present

## 2018-06-26 DIAGNOSIS — C320 Malignant neoplasm of glottis: Secondary | ICD-10-CM | POA: Diagnosis not present

## 2018-07-02 DIAGNOSIS — R221 Localized swelling, mass and lump, neck: Secondary | ICD-10-CM | POA: Diagnosis not present

## 2018-07-04 DIAGNOSIS — K219 Gastro-esophageal reflux disease without esophagitis: Secondary | ICD-10-CM | POA: Diagnosis not present

## 2018-07-04 DIAGNOSIS — I890 Lymphedema, not elsewhere classified: Secondary | ICD-10-CM | POA: Diagnosis not present

## 2018-07-04 DIAGNOSIS — R491 Aphonia: Secondary | ICD-10-CM | POA: Diagnosis not present

## 2018-07-04 DIAGNOSIS — G4730 Sleep apnea, unspecified: Secondary | ICD-10-CM | POA: Diagnosis not present

## 2018-07-04 DIAGNOSIS — R21 Rash and other nonspecific skin eruption: Secondary | ICD-10-CM | POA: Diagnosis not present

## 2018-07-04 DIAGNOSIS — Z9002 Acquired absence of larynx: Secondary | ICD-10-CM | POA: Diagnosis not present

## 2018-07-04 DIAGNOSIS — C320 Malignant neoplasm of glottis: Secondary | ICD-10-CM | POA: Diagnosis not present

## 2018-07-04 DIAGNOSIS — Z87891 Personal history of nicotine dependence: Secondary | ICD-10-CM | POA: Diagnosis not present

## 2018-07-04 DIAGNOSIS — L0211 Cutaneous abscess of neck: Secondary | ICD-10-CM | POA: Diagnosis not present

## 2018-07-04 DIAGNOSIS — L539 Erythematous condition, unspecified: Secondary | ICD-10-CM | POA: Diagnosis not present

## 2018-07-04 DIAGNOSIS — T8131XA Disruption of external operation (surgical) wound, not elsewhere classified, initial encounter: Secondary | ICD-10-CM | POA: Diagnosis not present

## 2018-07-04 DIAGNOSIS — R220 Localized swelling, mass and lump, head: Secondary | ICD-10-CM | POA: Diagnosis not present

## 2018-07-04 DIAGNOSIS — Z930 Tracheostomy status: Secondary | ICD-10-CM | POA: Diagnosis not present

## 2018-07-04 DIAGNOSIS — I10 Essential (primary) hypertension: Secondary | ICD-10-CM | POA: Diagnosis not present

## 2018-07-08 ENCOUNTER — Other Ambulatory Visit: Payer: Self-pay

## 2018-07-08 DIAGNOSIS — M542 Cervicalgia: Secondary | ICD-10-CM | POA: Diagnosis not present

## 2018-07-08 DIAGNOSIS — J385 Laryngeal spasm: Secondary | ICD-10-CM | POA: Diagnosis not present

## 2018-07-08 DIAGNOSIS — C329 Malignant neoplasm of larynx, unspecified: Secondary | ICD-10-CM | POA: Diagnosis not present

## 2018-07-08 NOTE — Patient Outreach (Signed)
[facility] [facility]) Care Management  07/08/2018  Alexander Ramirez 04-25-1951 [id number]     Transition of Care Referral  Referral Date: 07/08/18 Referral Source: HTA Discharge Report Date of Admission: unknown Diagnosis: "cutaneous abscess of neck" Date of Discharge: 07/08/18 Facility: [place]: HTA    Outreach attempt # 1 to patient. No answer at present and unable to leave message.    Plan: RN CM will make outreach attempt to patient within 3-4 business days. RN CM will send unsuccessful outreach letter to patient.    [name], RN,BSN,CCM [place] Management Telephonic Care Management Coordinator Direct Phone: [phone] Toll Free: [phone] Fax: [phone]

## 2018-07-09 ENCOUNTER — Other Ambulatory Visit: Payer: Self-pay

## 2018-07-09 DIAGNOSIS — R7303 Prediabetes: Secondary | ICD-10-CM | POA: Diagnosis not present

## 2018-07-09 DIAGNOSIS — M170 Bilateral primary osteoarthritis of knee: Secondary | ICD-10-CM | POA: Diagnosis not present

## 2018-07-09 DIAGNOSIS — C320 Malignant neoplasm of glottis: Secondary | ICD-10-CM | POA: Diagnosis not present

## 2018-07-09 DIAGNOSIS — I10 Essential (primary) hypertension: Secondary | ICD-10-CM | POA: Diagnosis not present

## 2018-07-09 NOTE — Patient Outreach (Signed)
[facility]) Care Management  07/09/2018  TIMOTH SCHARA 06/17/1951 [id number]   Transition of Care Referral  Referral Date: 07/08/18 Referral Source: HTA Discharge Report Date of Admission: unknown Diagnosis: "cutaneous abscess of neck" Date of Discharge: 07/08/18 Facility: [place]: HTA    Outreach attempt #2 to patient. No answer at present and unable to leave message.    Plan: RN CM will make outreach attempt to patient within 3-4 business days.   [name], RN,BSN,CCM [place] Management Telephonic Care Management Coordinator Direct Phone: [phone] Toll Free: [phone] Fax: [phone]

## 2018-07-10 ENCOUNTER — Other Ambulatory Visit: Payer: Self-pay

## 2018-07-10 NOTE — Patient Outreach (Signed)
[place] [facility]) Care Management  07/10/2018  Alexander Ramirez 25-Jul-1951 [id number]     Transition of Care Referral  Referral Date:07/08/18 Referral Source:HTA Discharge Report Date of Admission:unknown Diagnosis:"cutaneous abscess of neck" Date of Discharge:07/08/18 Facility:[facility] Insurance:HTA   Outreach attempt #3 to patient. No answer.    Plan: RN CM will close case if no response from letter mailed to patient.    [name], RN,BSN,CCM [place] Management Telephonic Care Management Coordinator Direct Phone: [phone] Toll Free: [phone] Fax: [phone]

## 2018-07-11 IMAGING — CT CT CHEST LUNG CANCER SCREENING LOW DOSE W/O CM
2 of 5 series · 15 of 40 positions shown, 18 images · non-contrast
Comparison: 04/09/2010 chest CT angiogram.

CLINICAL DATA: 65-year-old asymptomatic male former smoker with 40
pack-year smoking history, quit smoking in 0966.

EXAM:
CT CHEST WITHOUT CONTRAST LOW-DOSE FOR LUNG CANCER SCREENING
TECHNIQUE: Multidetector CT imaging of the chest was performed following the
standard protocol without IV contrast.

[Series 3: coronal · coronal · 0.71mm/px · 3 of 312 slices shown]
[im 63/312  lung]
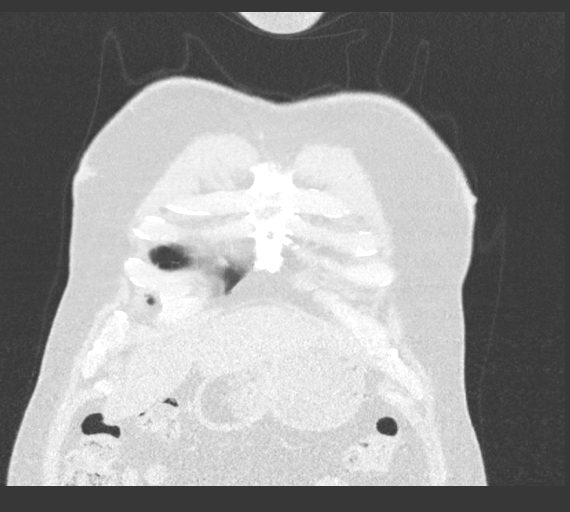
[im 125/312  lung]
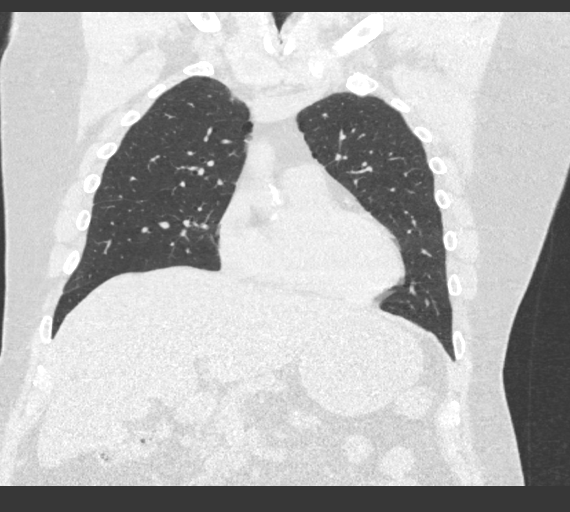
[im 187/312  lung]
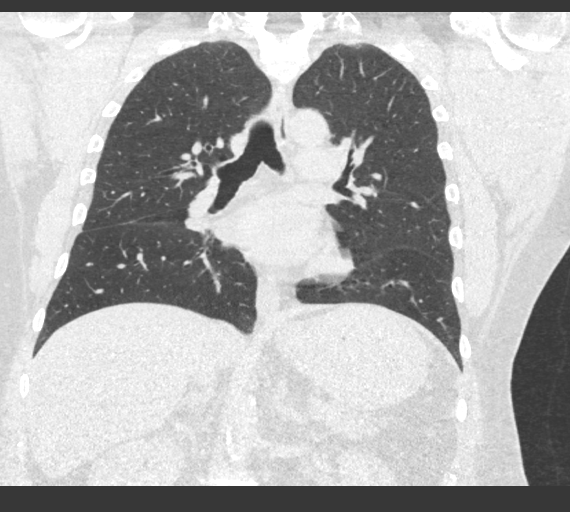

[Series 6: lungs · axial · 0.84mm/px · z∈[-318,-39]mm · 12 of 309 slices shown, 15 images]
[im 15/309  mediastinal]
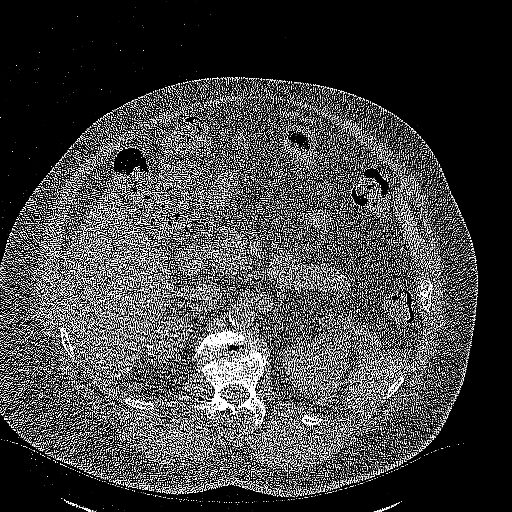
[im 15/309  lung]
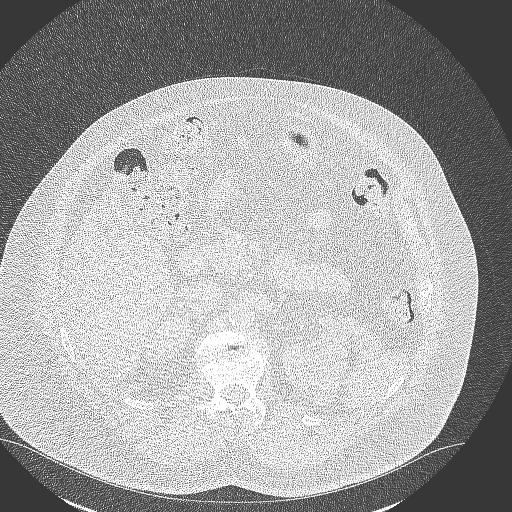
[im 45/309  lung]
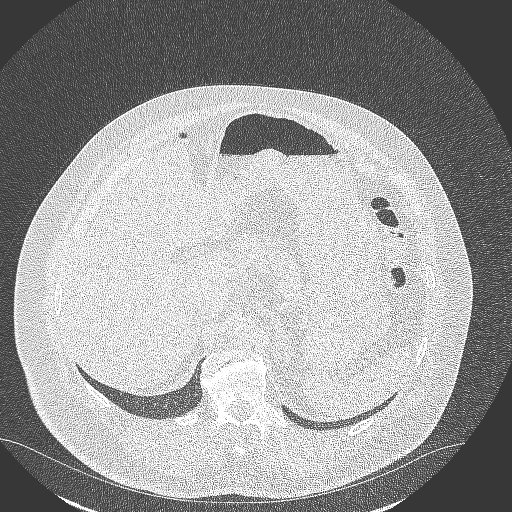
[im 74/309  lung]
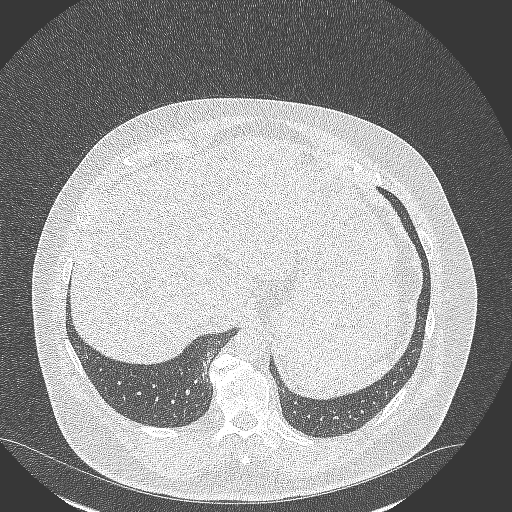
[im 89/309  lung]
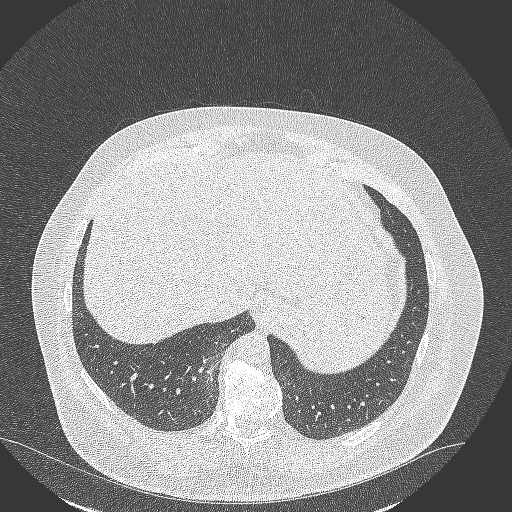
[im 118/309  mediastinal]
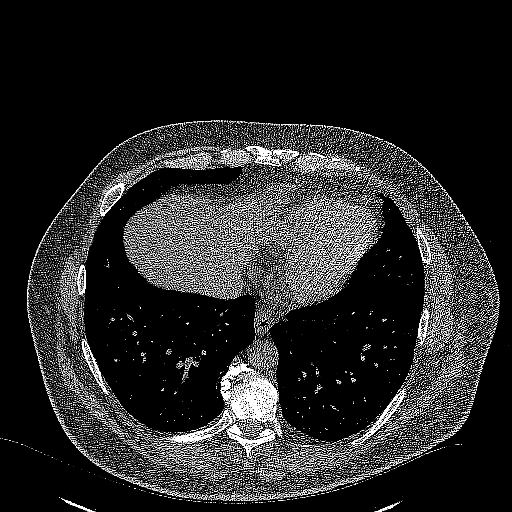
[im 118/309  lung]
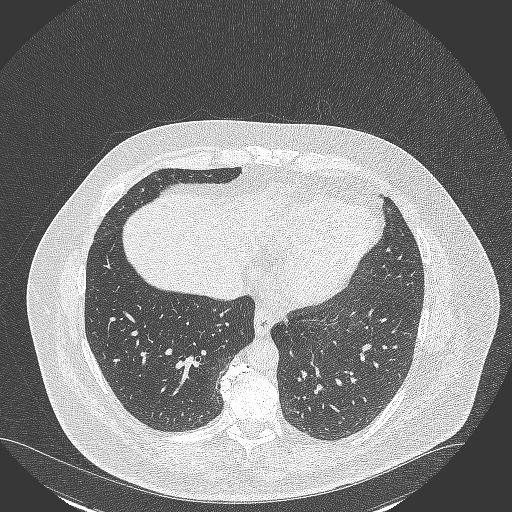
[im 147/309  lung]
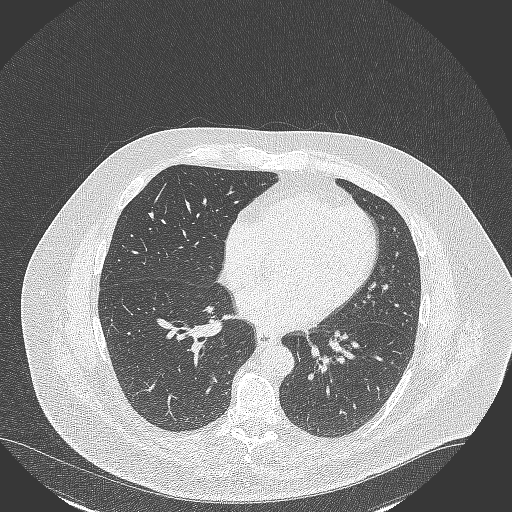
[im 162/309  lung]
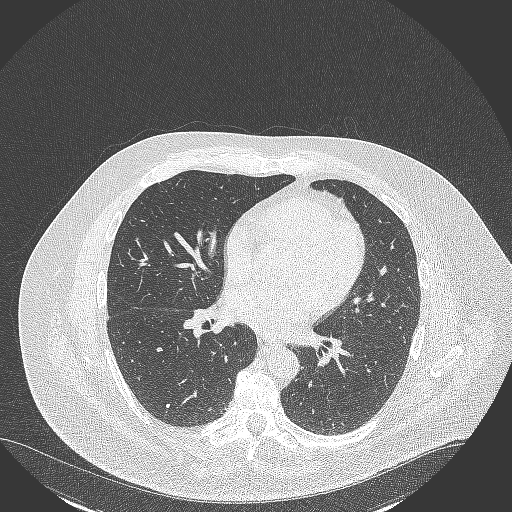
[im 191/309  lung]
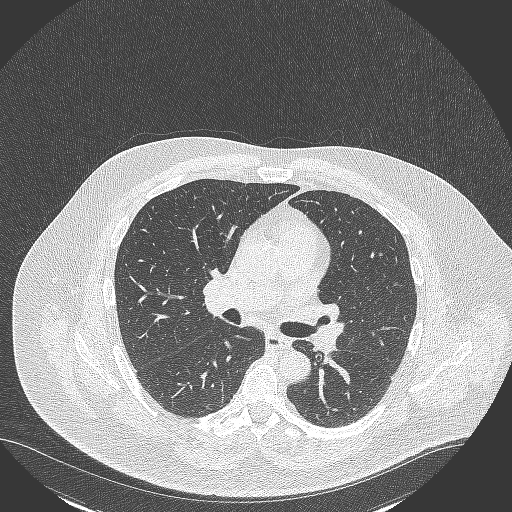
[im 221/309  mediastinal]
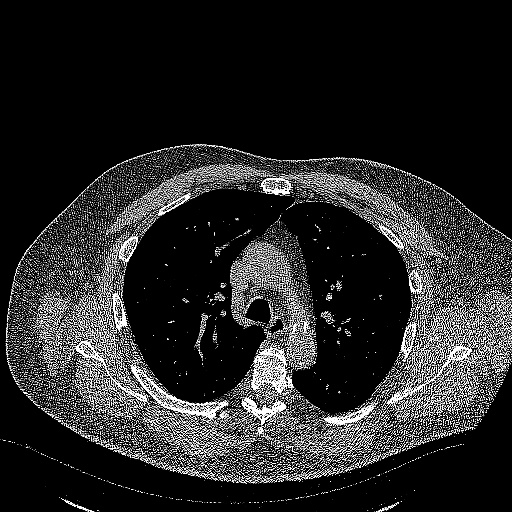
[im 221/309  lung]
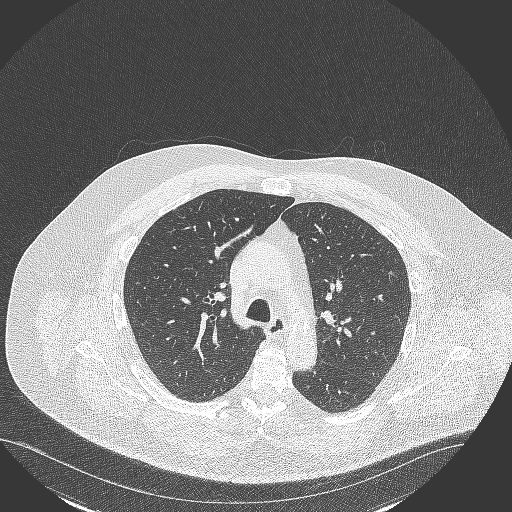
[im 235/309  lung]
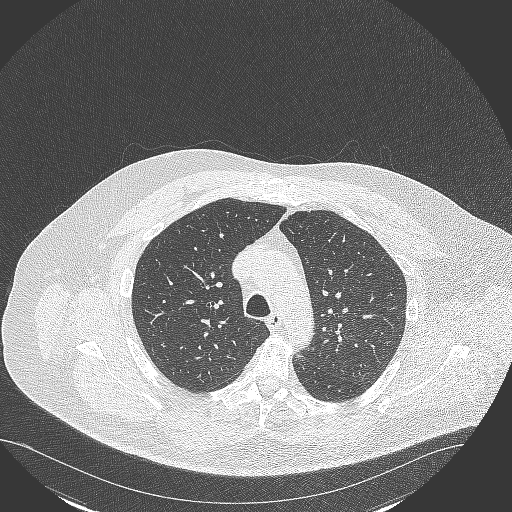
[im 265/309  lung]
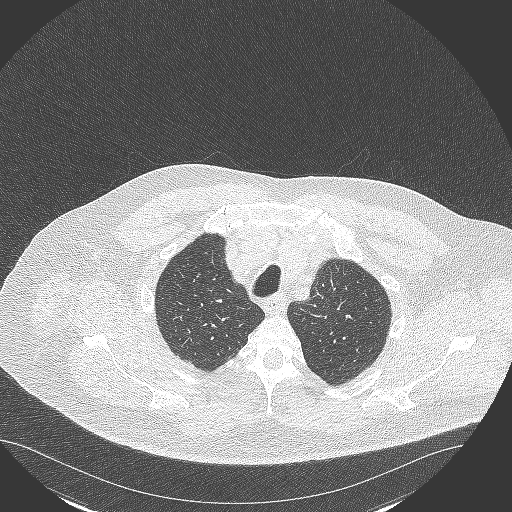
[im 294/309  lung]
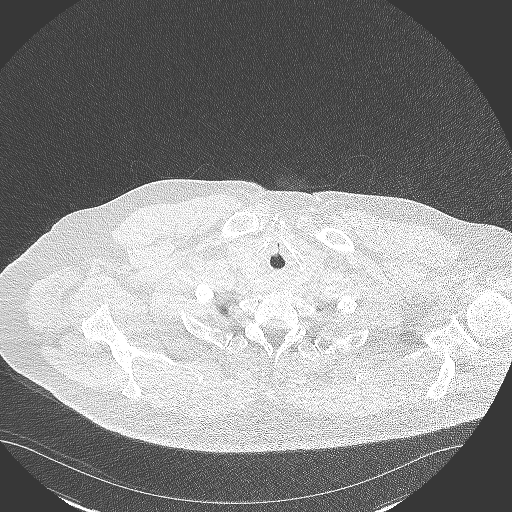

[15 of 40 positions shown; findings below may reference images not displayed]

FINDINGS: Cardiovascular: Normal heart size. No significant pericardial
fluid/thickening. Left main and left anterior descending coronary
atherosclerosis. Atherosclerotic nonaneurysmal thoracic aorta.
Normal caliber pulmonary arteries.

Mediastinum/Nodes: No discrete thyroid nodules. Unremarkable
esophagus. No pathologically enlarged axillary, mediastinal or gross
hilar lymph nodes, noting limited sensitivity for the detection of
hilar adenopathy on this noncontrast study.

Lungs/Pleura: No pneumothorax. No pleural effusion. No acute
consolidative airspace disease or lung masses. Scattered
subcentimeter calcified granulomas. A few small solid pulmonary
nodules in both lungs, largest measuring 4.6 mm in volume derived
mean diameter in the right middle lobe (series 6/ image 134). Mild
centrilobular and paraseptal emphysema.

Upper abdomen: Diffuse hepatic steatosis.

Musculoskeletal: No aggressive appearing focal osseous lesions.
Moderate thoracic spondylosis.
IMPRESSION: 1. Lung-RADS 2-S, benign appearance or behavior. Continue annual
screening with low-dose chest CT without contrast in 12 months.
2. The "S" modifier above refers to potentially clinically
significant non lung cancer related findings. Specifically, left
main and 1 vessel coronary atherosclerosis.
3. Diffuse hepatic steatosis.

Aortic Atherosclerosis (TCD65-8F9.9) and Emphysema (TCD65-XIJ.Y).

## 2018-07-15 DIAGNOSIS — R491 Aphonia: Secondary | ICD-10-CM | POA: Diagnosis not present

## 2018-07-15 DIAGNOSIS — Z963 Presence of artificial larynx: Secondary | ICD-10-CM | POA: Diagnosis not present

## 2018-07-15 DIAGNOSIS — C329 Malignant neoplasm of larynx, unspecified: Secondary | ICD-10-CM | POA: Diagnosis not present

## 2018-07-15 DIAGNOSIS — Z87891 Personal history of nicotine dependence: Secondary | ICD-10-CM | POA: Diagnosis not present

## 2018-07-16 DIAGNOSIS — M259 Joint disorder, unspecified: Secondary | ICD-10-CM | POA: Diagnosis not present

## 2018-07-16 DIAGNOSIS — Z9002 Acquired absence of larynx: Secondary | ICD-10-CM | POA: Diagnosis not present

## 2018-07-16 DIAGNOSIS — I890 Lymphedema, not elsewhere classified: Secondary | ICD-10-CM | POA: Diagnosis not present

## 2018-07-17 DIAGNOSIS — T85638A Leakage of other specified internal prosthetic devices, implants and grafts, initial encounter: Secondary | ICD-10-CM | POA: Diagnosis not present

## 2018-07-17 DIAGNOSIS — C329 Malignant neoplasm of larynx, unspecified: Secondary | ICD-10-CM | POA: Diagnosis not present

## 2018-07-17 DIAGNOSIS — Z930 Tracheostomy status: Secondary | ICD-10-CM | POA: Diagnosis not present

## 2018-07-21 DIAGNOSIS — C329 Malignant neoplasm of larynx, unspecified: Secondary | ICD-10-CM | POA: Diagnosis not present

## 2018-07-21 DIAGNOSIS — Z430 Encounter for attention to tracheostomy: Secondary | ICD-10-CM | POA: Diagnosis not present

## 2018-07-22 ENCOUNTER — Other Ambulatory Visit: Payer: Self-pay

## 2018-07-22 DIAGNOSIS — K220 Achalasia of cardia: Secondary | ICD-10-CM | POA: Diagnosis not present

## 2018-07-22 DIAGNOSIS — Z79899 Other long term (current) drug therapy: Secondary | ICD-10-CM | POA: Diagnosis not present

## 2018-07-22 DIAGNOSIS — K219 Gastro-esophageal reflux disease without esophagitis: Secondary | ICD-10-CM | POA: Diagnosis not present

## 2018-07-22 DIAGNOSIS — I10 Essential (primary) hypertension: Secondary | ICD-10-CM | POA: Diagnosis not present

## 2018-07-22 DIAGNOSIS — Z87891 Personal history of nicotine dependence: Secondary | ICD-10-CM | POA: Diagnosis not present

## 2018-07-22 DIAGNOSIS — Z8521 Personal history of malignant neoplasm of larynx: Secondary | ICD-10-CM | POA: Diagnosis not present

## 2018-07-22 DIAGNOSIS — J392 Other diseases of pharynx: Secondary | ICD-10-CM | POA: Diagnosis not present

## 2018-07-22 DIAGNOSIS — E890 Postprocedural hypothyroidism: Secondary | ICD-10-CM | POA: Diagnosis not present

## 2018-07-22 DIAGNOSIS — J385 Laryngeal spasm: Secondary | ICD-10-CM | POA: Diagnosis not present

## 2018-07-22 NOTE — Patient Outreach (Signed)
[facility]) Care Management  07/22/2018  KENDYN ZAMAN Apr 10, 1951 [id number]   Transition of Care Referral  Referral Date:07/08/18 Referral Source:HTA Discharge Report Date of Admission:unknown Diagnosis:"cutaneous abscess of neck" Date of Discharge:07/08/18 Facility:[facility] Insurance:HTA    Multiple attempts to establish contact with patient without success. No response from letter mailed to patient. Case is being closed at this time.     Plan: RN CM will close case at this time.  [name], RN,BSN,CCM [place] Management Telephonic Care Management Coordinator Direct Phone: [phone] Toll Free: [phone] Fax: [phone]

## 2018-07-30 DIAGNOSIS — Z448 Encounter for fitting and adjustment of other external prosthetic devices: Secondary | ICD-10-CM | POA: Diagnosis not present

## 2018-07-30 DIAGNOSIS — Z9002 Acquired absence of larynx: Secondary | ICD-10-CM | POA: Diagnosis not present

## 2018-07-30 DIAGNOSIS — Z8521 Personal history of malignant neoplasm of larynx: Secondary | ICD-10-CM | POA: Diagnosis not present

## 2018-08-04 DIAGNOSIS — C320 Malignant neoplasm of glottis: Secondary | ICD-10-CM | POA: Diagnosis not present

## 2018-08-07 DIAGNOSIS — Z448 Encounter for fitting and adjustment of other external prosthetic devices: Secondary | ICD-10-CM | POA: Diagnosis not present

## 2018-08-07 DIAGNOSIS — Z8521 Personal history of malignant neoplasm of larynx: Secondary | ICD-10-CM | POA: Diagnosis not present

## 2018-08-07 DIAGNOSIS — Z9002 Acquired absence of larynx: Secondary | ICD-10-CM | POA: Diagnosis not present

## 2018-08-12 DIAGNOSIS — I890 Lymphedema, not elsewhere classified: Secondary | ICD-10-CM | POA: Diagnosis not present

## 2018-08-12 DIAGNOSIS — Z9002 Acquired absence of larynx: Secondary | ICD-10-CM | POA: Diagnosis not present

## 2018-08-12 DIAGNOSIS — M259 Joint disorder, unspecified: Secondary | ICD-10-CM | POA: Diagnosis not present

## 2018-08-14 DIAGNOSIS — Z809 Family history of malignant neoplasm, unspecified: Secondary | ICD-10-CM | POA: Diagnosis not present

## 2018-08-14 DIAGNOSIS — Z8249 Family history of ischemic heart disease and other diseases of the circulatory system: Secondary | ICD-10-CM | POA: Diagnosis not present

## 2018-08-14 DIAGNOSIS — Z9002 Acquired absence of larynx: Secondary | ICD-10-CM | POA: Diagnosis not present

## 2018-08-14 DIAGNOSIS — Z8521 Personal history of malignant neoplasm of larynx: Secondary | ICD-10-CM | POA: Diagnosis not present

## 2018-08-14 DIAGNOSIS — I10 Essential (primary) hypertension: Secondary | ICD-10-CM | POA: Diagnosis not present

## 2018-08-14 DIAGNOSIS — Z87891 Personal history of nicotine dependence: Secondary | ICD-10-CM | POA: Diagnosis not present

## 2018-08-14 DIAGNOSIS — Z79899 Other long term (current) drug therapy: Secondary | ICD-10-CM | POA: Diagnosis not present

## 2018-08-14 DIAGNOSIS — J029 Acute pharyngitis, unspecified: Secondary | ICD-10-CM | POA: Diagnosis not present

## 2018-08-14 DIAGNOSIS — T85638A Leakage of other specified internal prosthetic devices, implants and grafts, initial encounter: Secondary | ICD-10-CM | POA: Diagnosis not present

## 2018-08-14 DIAGNOSIS — K219 Gastro-esophageal reflux disease without esophagitis: Secondary | ICD-10-CM | POA: Diagnosis not present

## 2018-08-26 DIAGNOSIS — Z430 Encounter for attention to tracheostomy: Secondary | ICD-10-CM | POA: Diagnosis not present

## 2018-08-26 DIAGNOSIS — Z930 Tracheostomy status: Secondary | ICD-10-CM | POA: Diagnosis not present

## 2018-08-27 DIAGNOSIS — Z963 Presence of artificial larynx: Secondary | ICD-10-CM | POA: Diagnosis not present

## 2018-08-27 DIAGNOSIS — C320 Malignant neoplasm of glottis: Secondary | ICD-10-CM | POA: Diagnosis not present

## 2018-08-27 DIAGNOSIS — T85638A Leakage of other specified internal prosthetic devices, implants and grafts, initial encounter: Secondary | ICD-10-CM | POA: Diagnosis not present

## 2018-09-18 DIAGNOSIS — Z430 Encounter for attention to tracheostomy: Secondary | ICD-10-CM | POA: Diagnosis not present

## 2018-09-18 DIAGNOSIS — Z4589 Encounter for adjustment and management of other implanted devices: Secondary | ICD-10-CM | POA: Diagnosis not present

## 2018-09-18 DIAGNOSIS — C320 Malignant neoplasm of glottis: Secondary | ICD-10-CM | POA: Diagnosis not present

## 2018-09-18 DIAGNOSIS — Z930 Tracheostomy status: Secondary | ICD-10-CM | POA: Diagnosis not present

## 2018-09-22 NOTE — Progress Notes (Signed)
error 

## 2018-09-25 ENCOUNTER — Telehealth: Payer: Self-pay | Admitting: Radiation Oncology

## 2018-09-25 ENCOUNTER — Ambulatory Visit
Admission: RE | Admit: 2018-09-25 | Discharge: 2018-09-25 | Disposition: A | Discharge status: Home / Self Care | Payer: PPO | Source: Ambulatory Visit | Attending: Radiation Oncology | Admitting: Radiation Oncology

## 2018-09-25 NOTE — Telephone Encounter (Signed)
Per Anderson Malta, RN, patient will not be coming in today. She has sent an in basket msg to Dr. Isidore Moos.

## 2018-10-02 DIAGNOSIS — R5383 Other fatigue: Secondary | ICD-10-CM | POA: Diagnosis not present

## 2018-10-02 DIAGNOSIS — E790 Hyperuricemia without signs of inflammatory arthritis and tophaceous disease: Secondary | ICD-10-CM | POA: Diagnosis not present

## 2018-10-02 DIAGNOSIS — R7303 Prediabetes: Secondary | ICD-10-CM | POA: Diagnosis not present

## 2018-10-02 DIAGNOSIS — Z430 Encounter for attention to tracheostomy: Secondary | ICD-10-CM | POA: Diagnosis not present

## 2018-10-02 DIAGNOSIS — D538 Other specified nutritional anemias: Secondary | ICD-10-CM | POA: Diagnosis not present

## 2018-10-02 DIAGNOSIS — E559 Vitamin D deficiency, unspecified: Secondary | ICD-10-CM | POA: Diagnosis not present

## 2018-10-02 DIAGNOSIS — E7800 Pure hypercholesterolemia, unspecified: Secondary | ICD-10-CM | POA: Diagnosis not present

## 2018-10-02 DIAGNOSIS — Z930 Tracheostomy status: Secondary | ICD-10-CM | POA: Diagnosis not present

## 2018-10-03 DIAGNOSIS — Z96651 Presence of right artificial knee joint: Secondary | ICD-10-CM | POA: Diagnosis not present

## 2018-10-03 DIAGNOSIS — R9389 Abnormal findings on diagnostic imaging of other specified body structures: Secondary | ICD-10-CM | POA: Diagnosis not present

## 2018-10-03 DIAGNOSIS — R918 Other nonspecific abnormal finding of lung field: Secondary | ICD-10-CM | POA: Diagnosis not present

## 2018-10-03 DIAGNOSIS — I10 Essential (primary) hypertension: Secondary | ICD-10-CM | POA: Diagnosis not present

## 2018-10-03 DIAGNOSIS — R0600 Dyspnea, unspecified: Secondary | ICD-10-CM | POA: Diagnosis not present

## 2018-10-03 DIAGNOSIS — R05 Cough: Secondary | ICD-10-CM | POA: Diagnosis not present

## 2018-10-03 DIAGNOSIS — J986 Disorders of diaphragm: Secondary | ICD-10-CM | POA: Diagnosis not present

## 2018-10-03 DIAGNOSIS — K449 Diaphragmatic hernia without obstruction or gangrene: Secondary | ICD-10-CM | POA: Diagnosis not present

## 2018-10-03 DIAGNOSIS — R0989 Other specified symptoms and signs involving the circulatory and respiratory systems: Secondary | ICD-10-CM | POA: Diagnosis not present

## 2018-10-03 DIAGNOSIS — Z87891 Personal history of nicotine dependence: Secondary | ICD-10-CM | POA: Diagnosis not present

## 2018-10-03 DIAGNOSIS — Z79899 Other long term (current) drug therapy: Secondary | ICD-10-CM | POA: Diagnosis not present

## 2018-10-03 DIAGNOSIS — C320 Malignant neoplasm of glottis: Secondary | ICD-10-CM | POA: Diagnosis not present

## 2018-10-03 DIAGNOSIS — J9811 Atelectasis: Secondary | ICD-10-CM | POA: Diagnosis not present

## 2018-10-03 DIAGNOSIS — R0602 Shortness of breath: Secondary | ICD-10-CM | POA: Diagnosis not present

## 2018-10-21 DIAGNOSIS — Z9002 Acquired absence of larynx: Secondary | ICD-10-CM | POA: Diagnosis not present

## 2018-10-21 DIAGNOSIS — R918 Other nonspecific abnormal finding of lung field: Secondary | ICD-10-CM | POA: Diagnosis not present

## 2018-10-21 DIAGNOSIS — C329 Malignant neoplasm of larynx, unspecified: Secondary | ICD-10-CM | POA: Diagnosis not present

## 2018-10-23 DIAGNOSIS — Z448 Encounter for fitting and adjustment of other external prosthetic devices: Secondary | ICD-10-CM | POA: Diagnosis not present

## 2018-10-23 DIAGNOSIS — Z9002 Acquired absence of larynx: Secondary | ICD-10-CM | POA: Diagnosis not present

## 2018-10-23 DIAGNOSIS — Z8521 Personal history of malignant neoplasm of larynx: Secondary | ICD-10-CM | POA: Diagnosis not present

## 2018-11-06 DIAGNOSIS — Z4589 Encounter for adjustment and management of other implanted devices: Secondary | ICD-10-CM | POA: Diagnosis not present

## 2018-11-06 DIAGNOSIS — Z9002 Acquired absence of larynx: Secondary | ICD-10-CM | POA: Diagnosis not present

## 2018-11-06 DIAGNOSIS — E890 Postprocedural hypothyroidism: Secondary | ICD-10-CM | POA: Diagnosis not present

## 2018-11-06 DIAGNOSIS — Z8249 Family history of ischemic heart disease and other diseases of the circulatory system: Secondary | ICD-10-CM | POA: Diagnosis not present

## 2018-11-06 DIAGNOSIS — Z8521 Personal history of malignant neoplasm of larynx: Secondary | ICD-10-CM | POA: Diagnosis not present

## 2018-11-06 DIAGNOSIS — I10 Essential (primary) hypertension: Secondary | ICD-10-CM | POA: Diagnosis not present

## 2018-11-06 DIAGNOSIS — K219 Gastro-esophageal reflux disease without esophagitis: Secondary | ICD-10-CM | POA: Diagnosis not present

## 2018-11-06 DIAGNOSIS — Z809 Family history of malignant neoplasm, unspecified: Secondary | ICD-10-CM | POA: Diagnosis not present

## 2018-11-06 DIAGNOSIS — Z87891 Personal history of nicotine dependence: Secondary | ICD-10-CM | POA: Diagnosis not present

## 2018-11-09 DIAGNOSIS — Z930 Tracheostomy status: Secondary | ICD-10-CM | POA: Diagnosis not present

## 2018-11-09 DIAGNOSIS — Z430 Encounter for attention to tracheostomy: Secondary | ICD-10-CM | POA: Diagnosis not present

## 2018-11-12 DIAGNOSIS — J0410 Acute tracheitis without obstruction: Secondary | ICD-10-CM | POA: Diagnosis not present

## 2018-11-12 DIAGNOSIS — C320 Malignant neoplasm of glottis: Secondary | ICD-10-CM | POA: Diagnosis not present

## 2018-11-12 DIAGNOSIS — I10 Essential (primary) hypertension: Secondary | ICD-10-CM | POA: Diagnosis not present

## 2018-11-12 DIAGNOSIS — Z87891 Personal history of nicotine dependence: Secondary | ICD-10-CM | POA: Diagnosis not present

## 2018-11-12 DIAGNOSIS — Z8521 Personal history of malignant neoplasm of larynx: Secondary | ICD-10-CM | POA: Diagnosis not present

## 2018-11-12 DIAGNOSIS — M542 Cervicalgia: Secondary | ICD-10-CM | POA: Diagnosis not present

## 2018-11-12 DIAGNOSIS — K219 Gastro-esophageal reflux disease without esophagitis: Secondary | ICD-10-CM | POA: Diagnosis not present

## 2018-11-12 DIAGNOSIS — Z923 Personal history of irradiation: Secondary | ICD-10-CM | POA: Diagnosis not present

## 2018-11-12 DIAGNOSIS — J42 Unspecified chronic bronchitis: Secondary | ICD-10-CM | POA: Diagnosis not present

## 2018-11-12 DIAGNOSIS — Z79899 Other long term (current) drug therapy: Secondary | ICD-10-CM | POA: Diagnosis not present

## 2018-11-12 DIAGNOSIS — Z9002 Acquired absence of larynx: Secondary | ICD-10-CM | POA: Diagnosis not present

## 2018-11-12 DIAGNOSIS — M25519 Pain in unspecified shoulder: Secondary | ICD-10-CM | POA: Diagnosis not present

## 2018-11-12 DIAGNOSIS — Z08 Encounter for follow-up examination after completed treatment for malignant neoplasm: Secondary | ICD-10-CM | POA: Diagnosis not present

## 2018-12-10 ENCOUNTER — Telehealth: Payer: Self-pay | Admitting: *Deleted

## 2018-12-10 NOTE — Telephone Encounter (Signed)
On 12-10-18 fax medical records [place] head and neck cancer study, it was consult note, end of tx note,follow up note.

## 2018-12-25 DIAGNOSIS — N202 Calculus of kidney with calculus of ureter: Secondary | ICD-10-CM | POA: Diagnosis not present

## 2018-12-25 DIAGNOSIS — N132 Hydronephrosis with renal and ureteral calculous obstruction: Secondary | ICD-10-CM | POA: Diagnosis not present

## 2018-12-25 DIAGNOSIS — R311 Benign essential microscopic hematuria: Secondary | ICD-10-CM | POA: Diagnosis not present

## 2019-01-18 DIAGNOSIS — R1031 Right lower quadrant pain: Secondary | ICD-10-CM | POA: Diagnosis not present

## 2019-02-10 DIAGNOSIS — Z930 Tracheostomy status: Secondary | ICD-10-CM | POA: Diagnosis not present

## 2019-02-10 DIAGNOSIS — Z430 Encounter for attention to tracheostomy: Secondary | ICD-10-CM | POA: Diagnosis not present

## 2019-02-17 DIAGNOSIS — E7800 Pure hypercholesterolemia, unspecified: Secondary | ICD-10-CM | POA: Diagnosis not present

## 2019-02-17 DIAGNOSIS — I700 Atherosclerosis of aorta: Secondary | ICD-10-CM | POA: Diagnosis not present

## 2019-02-17 DIAGNOSIS — R10817 Generalized abdominal tenderness: Secondary | ICD-10-CM | POA: Diagnosis not present

## 2019-02-17 DIAGNOSIS — K047 Periapical abscess without sinus: Secondary | ICD-10-CM | POA: Diagnosis not present

## 2019-02-17 DIAGNOSIS — Z930 Tracheostomy status: Secondary | ICD-10-CM | POA: Diagnosis not present

## 2019-02-17 DIAGNOSIS — Z430 Encounter for attention to tracheostomy: Secondary | ICD-10-CM | POA: Diagnosis not present

## 2019-02-17 DIAGNOSIS — R1084 Generalized abdominal pain: Secondary | ICD-10-CM | POA: Diagnosis not present

## 2019-03-24 DIAGNOSIS — Z9002 Acquired absence of larynx: Secondary | ICD-10-CM | POA: Diagnosis not present

## 2019-03-24 DIAGNOSIS — Z79899 Other long term (current) drug therapy: Secondary | ICD-10-CM | POA: Diagnosis not present

## 2019-03-24 DIAGNOSIS — Z448 Encounter for fitting and adjustment of other external prosthetic devices: Secondary | ICD-10-CM | POA: Diagnosis not present

## 2019-03-24 DIAGNOSIS — Z8521 Personal history of malignant neoplasm of larynx: Secondary | ICD-10-CM | POA: Diagnosis not present

## 2019-03-24 DIAGNOSIS — Z87891 Personal history of nicotine dependence: Secondary | ICD-10-CM | POA: Diagnosis not present

## 2019-03-30 DIAGNOSIS — Z930 Tracheostomy status: Secondary | ICD-10-CM | POA: Diagnosis not present

## 2019-03-30 DIAGNOSIS — Z430 Encounter for attention to tracheostomy: Secondary | ICD-10-CM | POA: Diagnosis not present

## 2019-04-20 DIAGNOSIS — G4730 Sleep apnea, unspecified: Secondary | ICD-10-CM | POA: Diagnosis not present

## 2019-04-20 DIAGNOSIS — Z9002 Acquired absence of larynx: Secondary | ICD-10-CM | POA: Diagnosis not present

## 2019-04-20 DIAGNOSIS — Z448 Encounter for fitting and adjustment of other external prosthetic devices: Secondary | ICD-10-CM | POA: Diagnosis not present

## 2019-04-20 DIAGNOSIS — E890 Postprocedural hypothyroidism: Secondary | ICD-10-CM | POA: Diagnosis not present

## 2019-04-20 DIAGNOSIS — M436 Torticollis: Secondary | ICD-10-CM | POA: Diagnosis not present

## 2019-04-20 DIAGNOSIS — K047 Periapical abscess without sinus: Secondary | ICD-10-CM | POA: Diagnosis not present

## 2019-04-20 DIAGNOSIS — M25512 Pain in left shoulder: Secondary | ICD-10-CM | POA: Diagnosis not present

## 2019-04-20 DIAGNOSIS — K089 Disorder of teeth and supporting structures, unspecified: Secondary | ICD-10-CM | POA: Diagnosis not present

## 2019-04-20 DIAGNOSIS — M62838 Other muscle spasm: Secondary | ICD-10-CM | POA: Diagnosis not present

## 2019-04-20 DIAGNOSIS — C323 Malignant neoplasm of laryngeal cartilage: Secondary | ICD-10-CM | POA: Diagnosis not present

## 2019-04-20 DIAGNOSIS — C320 Malignant neoplasm of glottis: Secondary | ICD-10-CM | POA: Diagnosis not present

## 2019-04-20 DIAGNOSIS — I890 Lymphedema, not elsewhere classified: Secondary | ICD-10-CM | POA: Diagnosis not present

## 2019-04-20 DIAGNOSIS — K143 Hypertrophy of tongue papillae: Secondary | ICD-10-CM | POA: Diagnosis not present

## 2019-04-20 DIAGNOSIS — R0602 Shortness of breath: Secondary | ICD-10-CM | POA: Diagnosis not present

## 2019-04-20 DIAGNOSIS — Z923 Personal history of irradiation: Secondary | ICD-10-CM | POA: Diagnosis not present

## 2019-04-20 DIAGNOSIS — Z87891 Personal history of nicotine dependence: Secondary | ICD-10-CM | POA: Diagnosis not present

## 2019-04-20 DIAGNOSIS — Z96651 Presence of right artificial knee joint: Secondary | ICD-10-CM | POA: Diagnosis not present

## 2019-04-20 DIAGNOSIS — I10 Essential (primary) hypertension: Secondary | ICD-10-CM | POA: Diagnosis not present

## 2019-05-07 DIAGNOSIS — R0602 Shortness of breath: Secondary | ICD-10-CM | POA: Diagnosis not present

## 2019-05-07 DIAGNOSIS — J986 Disorders of diaphragm: Secondary | ICD-10-CM | POA: Diagnosis not present

## 2019-05-27 DIAGNOSIS — K117 Disturbances of salivary secretion: Secondary | ICD-10-CM | POA: Diagnosis not present

## 2019-05-27 DIAGNOSIS — K029 Dental caries, unspecified: Secondary | ICD-10-CM | POA: Diagnosis not present

## 2019-05-27 DIAGNOSIS — K143 Hypertrophy of tongue papillae: Secondary | ICD-10-CM | POA: Diagnosis not present

## 2019-05-27 DIAGNOSIS — K0510 Chronic gingivitis, plaque induced: Secondary | ICD-10-CM | POA: Diagnosis not present

## 2019-05-27 DIAGNOSIS — R8589 Other abnormal findings in specimens from digestive organs and abdominal cavity: Secondary | ICD-10-CM | POA: Diagnosis not present

## 2019-07-27 DIAGNOSIS — J986 Disorders of diaphragm: Secondary | ICD-10-CM | POA: Diagnosis not present

## 2019-07-27 DIAGNOSIS — Z87891 Personal history of nicotine dependence: Secondary | ICD-10-CM | POA: Diagnosis not present

## 2019-07-27 DIAGNOSIS — R0602 Shortness of breath: Secondary | ICD-10-CM | POA: Diagnosis not present

## 2019-07-28 DIAGNOSIS — Z430 Encounter for attention to tracheostomy: Secondary | ICD-10-CM | POA: Diagnosis not present

## 2019-07-28 DIAGNOSIS — Z930 Tracheostomy status: Secondary | ICD-10-CM | POA: Diagnosis not present

## 2019-08-10 DIAGNOSIS — R9389 Abnormal findings on diagnostic imaging of other specified body structures: Secondary | ICD-10-CM | POA: Diagnosis not present

## 2019-08-10 DIAGNOSIS — C320 Malignant neoplasm of glottis: Secondary | ICD-10-CM | POA: Diagnosis not present

## 2019-08-10 DIAGNOSIS — Z8521 Personal history of malignant neoplasm of larynx: Secondary | ICD-10-CM | POA: Diagnosis not present

## 2019-08-10 DIAGNOSIS — J986 Disorders of diaphragm: Secondary | ICD-10-CM | POA: Diagnosis not present

## 2019-08-10 DIAGNOSIS — J9811 Atelectasis: Secondary | ICD-10-CM | POA: Diagnosis not present

## 2019-08-17 DIAGNOSIS — R9389 Abnormal findings on diagnostic imaging of other specified body structures: Secondary | ICD-10-CM | POA: Diagnosis not present

## 2019-08-17 DIAGNOSIS — J986 Disorders of diaphragm: Secondary | ICD-10-CM | POA: Diagnosis not present

## 2019-10-19 DIAGNOSIS — C320 Malignant neoplasm of glottis: Secondary | ICD-10-CM | POA: Diagnosis not present

## 2019-11-16 DIAGNOSIS — Z8521 Personal history of malignant neoplasm of larynx: Secondary | ICD-10-CM | POA: Diagnosis not present

## 2019-11-16 DIAGNOSIS — I10 Essential (primary) hypertension: Secondary | ICD-10-CM | POA: Diagnosis not present

## 2019-11-16 DIAGNOSIS — K219 Gastro-esophageal reflux disease without esophagitis: Secondary | ICD-10-CM | POA: Diagnosis not present

## 2019-11-16 DIAGNOSIS — R799 Abnormal finding of blood chemistry, unspecified: Secondary | ICD-10-CM | POA: Diagnosis not present

## 2019-11-16 DIAGNOSIS — Z96651 Presence of right artificial knee joint: Secondary | ICD-10-CM | POA: Diagnosis not present

## 2019-11-16 DIAGNOSIS — J986 Disorders of diaphragm: Secondary | ICD-10-CM | POA: Diagnosis not present

## 2019-11-16 DIAGNOSIS — R29898 Other symptoms and signs involving the musculoskeletal system: Secondary | ICD-10-CM | POA: Diagnosis not present

## 2019-11-16 DIAGNOSIS — Z79899 Other long term (current) drug therapy: Secondary | ICD-10-CM | POA: Diagnosis not present

## 2019-11-16 DIAGNOSIS — G540 Brachial plexus disorders: Secondary | ICD-10-CM | POA: Diagnosis not present

## 2019-11-22 DIAGNOSIS — E7800 Pure hypercholesterolemia, unspecified: Secondary | ICD-10-CM | POA: Diagnosis not present

## 2019-11-22 DIAGNOSIS — E559 Vitamin D deficiency, unspecified: Secondary | ICD-10-CM | POA: Diagnosis not present

## 2019-11-22 DIAGNOSIS — Z0000 Encounter for general adult medical examination without abnormal findings: Secondary | ICD-10-CM | POA: Diagnosis not present

## 2019-11-22 DIAGNOSIS — Z1159 Encounter for screening for other viral diseases: Secondary | ICD-10-CM | POA: Diagnosis not present

## 2019-11-22 DIAGNOSIS — E790 Hyperuricemia without signs of inflammatory arthritis and tophaceous disease: Secondary | ICD-10-CM | POA: Diagnosis not present

## 2019-11-29 DIAGNOSIS — J986 Disorders of diaphragm: Secondary | ICD-10-CM | POA: Diagnosis not present

## 2019-11-29 DIAGNOSIS — M50222 Other cervical disc displacement at C5-C6 level: Secondary | ICD-10-CM | POA: Diagnosis not present

## 2019-11-29 DIAGNOSIS — G540 Brachial plexus disorders: Secondary | ICD-10-CM | POA: Diagnosis not present

## 2019-12-20 DIAGNOSIS — R531 Weakness: Secondary | ICD-10-CM | POA: Diagnosis not present

## 2019-12-20 DIAGNOSIS — Z930 Tracheostomy status: Secondary | ICD-10-CM | POA: Diagnosis not present

## 2019-12-20 DIAGNOSIS — Z430 Encounter for attention to tracheostomy: Secondary | ICD-10-CM | POA: Diagnosis not present

## 2019-12-20 DIAGNOSIS — J986 Disorders of diaphragm: Secondary | ICD-10-CM | POA: Diagnosis not present

## 2019-12-20 DIAGNOSIS — G588 Other specified mononeuropathies: Secondary | ICD-10-CM | POA: Diagnosis not present

## 2019-12-20 DIAGNOSIS — R29898 Other symptoms and signs involving the musculoskeletal system: Secondary | ICD-10-CM | POA: Diagnosis not present

## 2019-12-28 DIAGNOSIS — R9389 Abnormal findings on diagnostic imaging of other specified body structures: Secondary | ICD-10-CM | POA: Diagnosis not present

## 2019-12-28 DIAGNOSIS — J986 Disorders of diaphragm: Secondary | ICD-10-CM | POA: Diagnosis not present

## 2019-12-28 DIAGNOSIS — J9811 Atelectasis: Secondary | ICD-10-CM | POA: Diagnosis not present

## 2019-12-29 DIAGNOSIS — Z430 Encounter for attention to tracheostomy: Secondary | ICD-10-CM | POA: Diagnosis not present

## 2019-12-29 DIAGNOSIS — Z9002 Acquired absence of larynx: Secondary | ICD-10-CM | POA: Diagnosis not present

## 2020-01-18 DIAGNOSIS — H2513 Age-related nuclear cataract, bilateral: Secondary | ICD-10-CM | POA: Diagnosis not present

## 2020-02-21 DIAGNOSIS — R5383 Other fatigue: Secondary | ICD-10-CM | POA: Diagnosis not present

## 2020-02-21 DIAGNOSIS — I10 Essential (primary) hypertension: Secondary | ICD-10-CM | POA: Diagnosis not present

## 2020-02-21 DIAGNOSIS — E559 Vitamin D deficiency, unspecified: Secondary | ICD-10-CM | POA: Diagnosis not present

## 2020-02-21 DIAGNOSIS — E790 Hyperuricemia without signs of inflammatory arthritis and tophaceous disease: Secondary | ICD-10-CM | POA: Diagnosis not present

## 2020-02-21 DIAGNOSIS — E7800 Pure hypercholesterolemia, unspecified: Secondary | ICD-10-CM | POA: Diagnosis not present

## 2020-02-22 DIAGNOSIS — C320 Malignant neoplasm of glottis: Secondary | ICD-10-CM | POA: Diagnosis not present

## 2020-02-22 DIAGNOSIS — C328 Malignant neoplasm of overlapping sites of larynx: Secondary | ICD-10-CM | POA: Diagnosis not present

## 2020-02-22 DIAGNOSIS — J986 Disorders of diaphragm: Secondary | ICD-10-CM | POA: Diagnosis not present

## 2020-02-22 DIAGNOSIS — Z87891 Personal history of nicotine dependence: Secondary | ICD-10-CM | POA: Diagnosis not present

## 2020-02-22 DIAGNOSIS — Z801 Family history of malignant neoplasm of trachea, bronchus and lung: Secondary | ICD-10-CM | POA: Diagnosis not present

## 2020-02-22 DIAGNOSIS — Z8249 Family history of ischemic heart disease and other diseases of the circulatory system: Secondary | ICD-10-CM | POA: Diagnosis not present

## 2020-02-22 DIAGNOSIS — Z923 Personal history of irradiation: Secondary | ICD-10-CM | POA: Diagnosis not present

## 2020-02-22 DIAGNOSIS — Z833 Family history of diabetes mellitus: Secondary | ICD-10-CM | POA: Diagnosis not present

## 2020-02-22 DIAGNOSIS — R0602 Shortness of breath: Secondary | ICD-10-CM | POA: Diagnosis not present

## 2020-02-22 DIAGNOSIS — Z9489 Other transplanted organ and tissue status: Secondary | ICD-10-CM | POA: Diagnosis not present

## 2020-02-22 DIAGNOSIS — I890 Lymphedema, not elsewhere classified: Secondary | ICD-10-CM | POA: Diagnosis not present

## 2020-02-22 DIAGNOSIS — M62838 Other muscle spasm: Secondary | ICD-10-CM | POA: Diagnosis not present

## 2020-02-22 DIAGNOSIS — I10 Essential (primary) hypertension: Secondary | ICD-10-CM | POA: Diagnosis not present

## 2020-02-22 DIAGNOSIS — Z79899 Other long term (current) drug therapy: Secondary | ICD-10-CM | POA: Diagnosis not present

## 2020-02-22 DIAGNOSIS — K219 Gastro-esophageal reflux disease without esophagitis: Secondary | ICD-10-CM | POA: Diagnosis not present

## 2020-02-22 DIAGNOSIS — Z9002 Acquired absence of larynx: Secondary | ICD-10-CM | POA: Diagnosis not present

## 2020-02-22 DIAGNOSIS — E890 Postprocedural hypothyroidism: Secondary | ICD-10-CM | POA: Diagnosis not present

## 2020-02-22 DIAGNOSIS — G4730 Sleep apnea, unspecified: Secondary | ICD-10-CM | POA: Diagnosis not present

## 2020-02-22 DIAGNOSIS — M436 Torticollis: Secondary | ICD-10-CM | POA: Diagnosis not present

## 2020-02-22 DIAGNOSIS — M25512 Pain in left shoulder: Secondary | ICD-10-CM | POA: Diagnosis not present

## 2020-04-28 DIAGNOSIS — Z9002 Acquired absence of larynx: Secondary | ICD-10-CM | POA: Diagnosis not present

## 2020-04-28 DIAGNOSIS — C320 Malignant neoplasm of glottis: Secondary | ICD-10-CM | POA: Diagnosis not present

## 2020-05-23 DIAGNOSIS — R5383 Other fatigue: Secondary | ICD-10-CM | POA: Diagnosis not present

## 2020-05-23 DIAGNOSIS — C320 Malignant neoplasm of glottis: Secondary | ICD-10-CM | POA: Diagnosis not present

## 2020-06-06 DIAGNOSIS — Z8521 Personal history of malignant neoplasm of larynx: Secondary | ICD-10-CM | POA: Diagnosis not present

## 2020-06-06 DIAGNOSIS — Z483 Aftercare following surgery for neoplasm: Secondary | ICD-10-CM | POA: Diagnosis not present

## 2020-06-06 DIAGNOSIS — Z9002 Acquired absence of larynx: Secondary | ICD-10-CM | POA: Diagnosis not present

## 2020-07-13 DIAGNOSIS — Z430 Encounter for attention to tracheostomy: Secondary | ICD-10-CM | POA: Diagnosis not present

## 2020-07-28 DIAGNOSIS — K219 Gastro-esophageal reflux disease without esophagitis: Secondary | ICD-10-CM | POA: Diagnosis not present

## 2020-07-28 DIAGNOSIS — Z9002 Acquired absence of larynx: Secondary | ICD-10-CM | POA: Diagnosis not present

## 2020-07-28 DIAGNOSIS — Z79899 Other long term (current) drug therapy: Secondary | ICD-10-CM | POA: Diagnosis not present

## 2020-07-28 DIAGNOSIS — I10 Essential (primary) hypertension: Secondary | ICD-10-CM | POA: Diagnosis not present

## 2020-07-28 DIAGNOSIS — Z448 Encounter for fitting and adjustment of other external prosthetic devices: Secondary | ICD-10-CM | POA: Diagnosis not present

## 2020-07-28 DIAGNOSIS — Z87891 Personal history of nicotine dependence: Secondary | ICD-10-CM | POA: Diagnosis not present

## 2020-07-28 DIAGNOSIS — Z8521 Personal history of malignant neoplasm of larynx: Secondary | ICD-10-CM | POA: Diagnosis not present

## 2020-07-31 DIAGNOSIS — R5383 Other fatigue: Secondary | ICD-10-CM | POA: Diagnosis not present

## 2020-07-31 DIAGNOSIS — E7800 Pure hypercholesterolemia, unspecified: Secondary | ICD-10-CM | POA: Diagnosis not present

## 2020-07-31 DIAGNOSIS — D539 Nutritional anemia, unspecified: Secondary | ICD-10-CM | POA: Diagnosis not present

## 2020-07-31 DIAGNOSIS — E790 Hyperuricemia without signs of inflammatory arthritis and tophaceous disease: Secondary | ICD-10-CM | POA: Diagnosis not present

## 2020-07-31 DIAGNOSIS — Z125 Encounter for screening for malignant neoplasm of prostate: Secondary | ICD-10-CM | POA: Diagnosis not present

## 2020-07-31 DIAGNOSIS — R300 Dysuria: Secondary | ICD-10-CM | POA: Diagnosis not present

## 2020-07-31 DIAGNOSIS — E559 Vitamin D deficiency, unspecified: Secondary | ICD-10-CM | POA: Diagnosis not present

## 2020-09-19 DIAGNOSIS — R0981 Nasal congestion: Secondary | ICD-10-CM | POA: Diagnosis not present

## 2020-09-19 DIAGNOSIS — I10 Essential (primary) hypertension: Secondary | ICD-10-CM | POA: Diagnosis not present

## 2020-09-19 DIAGNOSIS — Z87442 Personal history of urinary calculi: Secondary | ICD-10-CM | POA: Diagnosis not present

## 2020-09-19 DIAGNOSIS — Z9002 Acquired absence of larynx: Secondary | ICD-10-CM | POA: Diagnosis not present

## 2020-09-19 DIAGNOSIS — J300 Vasomotor rhinitis: Secondary | ICD-10-CM | POA: Diagnosis not present

## 2020-09-19 DIAGNOSIS — C320 Malignant neoplasm of glottis: Secondary | ICD-10-CM | POA: Diagnosis not present

## 2020-09-19 DIAGNOSIS — R0602 Shortness of breath: Secondary | ICD-10-CM | POA: Diagnosis not present

## 2020-09-19 DIAGNOSIS — K219 Gastro-esophageal reflux disease without esophagitis: Secondary | ICD-10-CM | POA: Diagnosis not present

## 2020-09-19 DIAGNOSIS — Z79899 Other long term (current) drug therapy: Secondary | ICD-10-CM | POA: Diagnosis not present

## 2020-09-19 DIAGNOSIS — Z96651 Presence of right artificial knee joint: Secondary | ICD-10-CM | POA: Diagnosis not present

## 2020-09-19 DIAGNOSIS — Z87891 Personal history of nicotine dependence: Secondary | ICD-10-CM | POA: Diagnosis not present

## 2020-10-17 DIAGNOSIS — Z430 Encounter for attention to tracheostomy: Secondary | ICD-10-CM | POA: Diagnosis not present

## 2020-10-25 ENCOUNTER — Emergency Department (HOSPITAL_BASED_OUTPATIENT_CLINIC_OR_DEPARTMENT_OTHER)
Admission: EM | Admit: 2020-10-25 | Discharge: 2020-10-25 | Disposition: A | Discharge status: Home / Self Care | Payer: PPO | Attending: Emergency Medicine | Admitting: Emergency Medicine

## 2020-10-25 ENCOUNTER — Encounter (HOSPITAL_BASED_OUTPATIENT_CLINIC_OR_DEPARTMENT_OTHER): Payer: Self-pay

## 2020-10-25 ENCOUNTER — Emergency Department (HOSPITAL_BASED_OUTPATIENT_CLINIC_OR_DEPARTMENT_OTHER): Payer: PPO

## 2020-10-25 ENCOUNTER — Other Ambulatory Visit: Payer: Self-pay

## 2020-10-25 DIAGNOSIS — Z85818 Personal history of malignant neoplasm of other sites of lip, oral cavity, and pharynx: Secondary | ICD-10-CM | POA: Insufficient documentation

## 2020-10-25 DIAGNOSIS — I10 Essential (primary) hypertension: Secondary | ICD-10-CM | POA: Diagnosis not present

## 2020-10-25 DIAGNOSIS — J101 Influenza due to other identified influenza virus with other respiratory manifestations: Secondary | ICD-10-CM | POA: Insufficient documentation

## 2020-10-25 DIAGNOSIS — Z87891 Personal history of nicotine dependence: Secondary | ICD-10-CM | POA: Diagnosis not present

## 2020-10-25 DIAGNOSIS — Z96651 Presence of right artificial knee joint: Secondary | ICD-10-CM | POA: Diagnosis not present

## 2020-10-25 DIAGNOSIS — Z20822 Contact with and (suspected) exposure to covid-19: Secondary | ICD-10-CM | POA: Diagnosis not present

## 2020-10-25 DIAGNOSIS — R918 Other nonspecific abnormal finding of lung field: Secondary | ICD-10-CM | POA: Diagnosis not present

## 2020-10-25 DIAGNOSIS — Z79899 Other long term (current) drug therapy: Secondary | ICD-10-CM | POA: Diagnosis not present

## 2020-10-25 DIAGNOSIS — R509 Fever, unspecified: Secondary | ICD-10-CM | POA: Diagnosis present

## 2020-10-25 DIAGNOSIS — R000 Tachycardia, unspecified: Secondary | ICD-10-CM | POA: Diagnosis not present

## 2020-10-25 HISTORY — DX: Disorders of diaphragm: J98.6

## 2020-10-25 LAB — COMPREHENSIVE METABOLIC PANEL
ALT: 84 U/L — ABNORMAL HIGH (ref 0–44)
AST: 48 U/L — ABNORMAL HIGH (ref 15–41)
Albumin: 4.3 g/dL (ref 3.5–5.0)
Alkaline Phosphatase: 73 U/L (ref 38–126)
Anion gap: 8 (ref 5–15)
BUN: 16 mg/dL (ref 8–23)
CO2: 25 mmol/L (ref 22–32)
Calcium: 8.9 mg/dL (ref 8.9–10.3)
Chloride: 105 mmol/L (ref 98–111)
Creatinine, Ser: 0.94 mg/dL (ref 0.61–1.24)
GFR, Estimated: >60 mL/min (ref >60)
Glucose, Bld: 98 mg/dL (ref 70–99)
Potassium: 4.2 mmol/L (ref 3.5–5.1)
Sodium: 138 mmol/L (ref 135–145)
Total Bilirubin: 0.5 mg/dL (ref 0.3–1.2)
Total Protein: 7.8 g/dL (ref 6.5–8.1)

## 2020-10-25 LAB — CBC WITH DIFFERENTIAL/PLATELET
Abs Immature Granulocytes: 0.03 10*3/uL (ref 0.00–0.07)
Basophils Absolute: 0.1 10*3/uL (ref 0.0–0.1)
Basophils Relative: 1 %
Eosinophils Absolute: 0.1 10*3/uL (ref 0.0–0.5)
Eosinophils Relative: 1 %
HCT: 48.7 % (ref 39.0–52.0)
Hemoglobin: 16.4 g/dL (ref 13.0–17.0)
Immature Granulocytes: 0 %
Lymphocytes Relative: 9 %
Lymphs Abs: 0.7 10*3/uL (ref 0.7–4.0)
MCH: 31.1 pg (ref 26.0–34.0)
MCHC: 33.7 g/dL (ref 30.0–36.0)
MCV: 92.2 fL (ref 80.0–100.0)
Monocytes Absolute: 1 10*3/uL (ref 0.1–1.0)
Monocytes Relative: 13 %
Neutro Abs: 5.6 10*3/uL (ref 1.7–7.7)
Neutrophils Relative %: 76 %
Platelets: 132 10*3/uL — ABNORMAL LOW (ref 150–400)
RBC: 5.28 MIL/uL (ref 4.22–5.81)
RDW: 13.3 % (ref 11.5–15.5)
WBC: 7.4 10*3/uL (ref 4.0–10.5)
nRBC: 0 % (ref 0.0–0.2)

## 2020-10-25 LAB — RESP PANEL BY RT-PCR (FLU A&B, COVID) ARPGX2
Influenza A by PCR: POSITIVE — AB
Influenza B by PCR: NEGATIVE
SARS Coronavirus 2 by RT PCR: NEGATIVE

## 2020-10-25 LAB — LACTIC ACID, PLASMA: Lactic Acid, Venous: 1.2 mmol/L (ref 0.5–1.9)

## 2020-10-25 MED ORDER — ALBUTEROL SULFATE (2.5 MG/3ML) 0.083% IN NEBU: 2.5000 mg | INHALATION_SOLUTION | Freq: Four times a day (QID) | RESPIRATORY_TRACT | 12 refills | Status: DC | PRN

## 2020-10-25 MED ORDER — ACETAMINOPHEN 500 MG PO TABS
1000.0000 mg | ORAL_TABLET | Freq: Once | ORAL | Status: AC
  Administered 2020-10-25: 1000 mg via ORAL
  Filled 2020-10-25: qty 2

## 2020-10-25 MED ORDER — ALBUTEROL SULFATE (2.5 MG/3ML) 0.083% IN NEBU
2.5000 mg | INHALATION_SOLUTION | Freq: Once | RESPIRATORY_TRACT | Status: AC
  Administered 2020-10-25: 2.5 mg via RESPIRATORY_TRACT
  Filled 2020-10-25: qty 3

## 2020-10-25 MED ORDER — OSELTAMIVIR PHOSPHATE 75 MG PO CAPS: 75.0000 mg | ORAL_CAPSULE | Freq: Two times a day (BID) | ORAL | 0 refills | Status: DC

## 2020-10-25 MED ORDER — OSELTAMIVIR PHOSPHATE 75 MG PO CAPS
75.0000 mg | ORAL_CAPSULE | Freq: Once | ORAL | Status: AC
  Administered 2020-10-25: 75 mg via ORAL
  Filled 2020-10-25: qty 1

## 2020-10-25 NOTE — ED Provider Notes (Signed)
[facility]ERG[facility] DEPARTMENT Provider Note  CSN: [id number] Arrival date & time: 10/25/20 1845    History Chief Complaint  Patient presents with  . Fever    HPI  Alexander Ramirez is a 69 y.o. male with history of throat cancer s/p laryngectomy reports he has been feeling sick since last night with productive cough last night now dry and associated with SOB and fever to 103F at home prior to arrival. He has a known prior paralyzed hemidiaphragm, has been SOB with even mild exertion since last night. Wife has also been sick recently but she tested negative for Covid and Flu at PCP office recently. Denies any abdominal pain or vomiting.    Past Medical History:  Diagnosis Date  . Arthritis   . Cancer (HCC)    throat cancer  . GERD (gastroesophageal reflux disease)    OTC meds  . Gout   . Hemidiaphragm paralysis   . History of radiation therapy 05/26/2017- 07/02/2017   Larynx 63 Gy in 28 fractions.   . Hypercholesteremia   . Hypertension   . Kidney stones   . Primary localized osteoarthritis of right knee 12/09/2017  . Sleep apnea    uses CPAP nightly    Past Surgical History:  Procedure Laterality Date  . HERNIA REPAIR    . KIDNEY STONE SURGERY    . LARYNGECTOMY    . MICROLARYNGOSCOPY WITH CO2 LASER AND EXCISION OF VOCAL CORD LESION Left 04/28/2017   Procedure: MICROLARYNGOSCOPY WITH EXCISION LEFT VOCAL CORD MASS WITH CO2 LASER;  Surgeon: Izora Gala, MD;  Location: [facility];  Service: ENT;  Laterality: Left;  . PARTIAL KNEE ARTHROPLASTY Right 12/09/2017   Procedure: UNICOMPARTMENTAL KNEE;  Surgeon: Marchia Bond, MD;  Location: [facility];  Service: Orthopedics;  Laterality: Right;  . TONSILLECTOMY    . UVULOPALATOPHARYNGOPLASTY (UPPP)/TONSILLECTOMY/SEPTOPLASTY     for sleep apnea    Family History  Problem Relation Age of Onset  . Lung cancer Father        Passed at the age of 53    Social History   Tobacco Use  . Smoking status:  Former Research scientist (life sciences)  . Smokeless tobacco: Never Used  Vaping Use  . Vaping Use: Never used  Substance Use Topics  . Alcohol use: Yes    Comment: occ  . Drug use: No     Home Medications Prior to Admission medications   Medication Sig Start Date End Date Taking? Authorizing Provider  albuterol (PROVENTIL) (2.5 MG/3ML) 0.083% nebulizer solution Take 3 mLs (2.5 mg total) by nebulization every 6 (six) hours as needed for wheezing or shortness of breath. 10/25/20   Truddie Hidden, MD  allopurinol (ZYLOPRIM) 100 MG tablet Take 200 mg by mouth daily.    [provider]  atorvastatin (LIPITOR) 40 MG tablet Take 20 mg by mouth daily.     [provider]  baclofen (LIORESAL) 10 MG tablet Take 1 tablet (10 mg total) by mouth 3 (three) times daily. As needed for muscle spasm Patient not taking: Reported on 03/20/2018 12/09/17   Marchia Bond, MD  carvedilol (COREG) 25 MG tablet Take 25 mg by mouth daily.    [provider]  lidocaine (XYLOCAINE) 2 % solution Patient: Mix 1part 2% viscous lidocaine, 1part H20. Swallow 52mL of diluted mixture, 52min before meals and at bedtime, up to QID Patient not taking: Reported on 07/16/2017 05/26/17   Eppie Gibson, MD  meloxicam (MOBIC) 15 MG tablet Take 15 mg  by mouth daily.    [provider]  omeprazole (PRILOSEC OTC) 20 MG tablet Take 20 mg by mouth daily.    [provider]  ondansetron (ZOFRAN) 4 MG tablet Take 1 tablet (4 mg total) by mouth every 8 (eight) hours as needed for nausea or vomiting. Patient not taking: Reported on 03/20/2018 12/09/17   Marchia Bond, MD  oseltamivir (TAMIFLU) 75 MG capsule Take 1 capsule (75 mg total) by mouth every 12 (twelve) hours. 10/25/20   Truddie Hidden, MD  oxyCODONE (ROXICODONE) 5 MG immediate release tablet Take 1 tablet (5 mg total) by mouth every 4 (four) hours as needed for severe pain. Patient not taking: Reported on 03/20/2018 12/09/17   Marchia Bond, MD  telmisartan  (MICARDIS) 80 MG tablet Take 80 mg by mouth daily.    [provider]     Allergies    Patient has no known allergies.   Review of Systems   Review of Systems A comprehensive review of systems was completed and negative except as noted in HPI.    Physical Exam BP (!) 154/83 (BP Location: Right Arm)   Pulse 84   Temp (!) 101.3 F (38.5 C) (Oral)   Resp 13   SpO2 94%   Physical Exam Vitals and nursing note reviewed.  Constitutional:      Appearance: Normal appearance.  HENT:     Head: Normocephalic and atraumatic.     Nose: Nose normal.     Mouth/Throat:     Mouth: Mucous membranes are moist.  Eyes:     Extraocular Movements: Extraocular movements intact.     Conjunctiva/sclera: Conjunctivae normal.  Cardiovascular:     Rate and Rhythm: Tachycardia present.  Pulmonary:     Effort: Respiratory distress present.     Breath sounds: Wheezing present.     Comments: Tachypnea Abdominal:     General: Abdomen is flat.     Palpations: Abdomen is soft.     Tenderness: There is no abdominal tenderness.  Musculoskeletal:        General: No swelling. Normal range of motion.     Cervical back: Neck supple.  Skin:    General: Skin is warm and dry.  Neurological:     General: No focal deficit present.     Mental Status: He is alert.  Psychiatric:        Mood and Affect: Mood normal.      ED Results / Procedures / Treatments   Labs (all labs ordered are listed, but only abnormal results are displayed) Labs Reviewed  RESP PANEL BY RT-PCR (FLU A&B, COVID) ARPGX2 - Abnormal; Notable for the following components:      Result Value   Influenza A by PCR POSITIVE (*)    All other components within normal limits  COMPREHENSIVE METABOLIC PANEL - Abnormal; Notable for the following components:   AST 48 (*)    ALT 84 (*)    All other components within normal limits  CBC WITH DIFFERENTIAL/PLATELET - Abnormal; Notable for the following components:   Platelets 132 (*)     All other components within normal limits  CULTURE, BLOOD (ROUTINE X 2)  CULTURE, BLOOD (ROUTINE X 2)  LACTIC ACID, PLASMA  LACTIC ACID, PLASMA    EKG None   Radiology DG Chest Portable 1 View  Result Date: 10/25/2020 CLINICAL DATA:  Cough, fever. EXAM: PORTABLE CHEST 1 VIEW COMPARISON:  CT chest 10/30/2017. FINDINGS: Borderline enlarged cardiac silhouette. Marked elevation left hemidiaphragm. Overlying  linear left basilar opacities. No visible pleural effusions or pneumothorax. No acute osseous abnormality. Gaseous dilation of stomach and colon in the partially imaged left upper abdomen. IMPRESSION: 1. Markedly elevated left hemidiaphragm. Overlying opacities most likely represent atelectasis, although aspiration and/or pneumonia is not excluded. 2. Gaseous dilation of stomach and colon in the left upper abdomen. Dedicated abdominal radiographs to further evaluate if clinically indicated. Electronically Signed   By: Margaretha Sheffield MD   On: 10/25/2020 19:44    Procedures Procedures  Medications Ordered in the ED Medications  albuterol (PROVENTIL) (2.5 MG/3ML) 0.083% nebulizer solution 2.5 mg (2.5 mg Nebulization Given 10/25/20 2046)  oseltamivir (TAMIFLU) capsule 75 mg (75 mg Oral Given 10/25/20 2059)     MDM Rules/Calculators/A&P MDM Patient markedly dyspneic and tachypneic on my initial eval after walking a short distance from waiting room to exam room. Placed on SpO2 with initial readings 91% while sitting. As he was able to rest his RR and SpO2 improved. He is not moving much air, particularly on the left where he has a known paralyzed hemidiaphragm. His Covid/Flu swab was positive for Influenza A. Will give a neb, check basic labs and begin Tamiflu.  ED Course  I have reviewed the triage vital signs and the nursing notes.  Pertinent labs & imaging results that were available during my care of the patient were reviewed by me and considered in my medical decision making (see  chart for details).  Clinical Course as of Oct 25 2214  Wed Oct 25, 2020  2150 CMP unremarkable except for mildly elevated liver enzymes of unclear significance.    [CS]  2159 Patient reports he is feeling much better after neb. SpO2 improved with rest. He is able to cough up some sputum now.   [CS]  2204 Lactic acid is normal.    [CS]  2211 CBC is normal. Patient would like to go home. He is comfortable managing his dyspnea as this is not a new problem for him. He is requesting Rx for nebulizer, will give him Rx for Tamiflu as well. Recommend close PCP follow up. RTED for any other worsening.    [CS]    Clinical Course User Index [CS] Truddie Hidden, MD    Final Clinical Impression(s) / ED Diagnoses Final diagnoses:  Influenza A    Rx / DC Orders ED Discharge Orders         Ordered    oseltamivir (TAMIFLU) 75 MG capsule  Every 12 hours        10/25/20 2214    albuterol (PROVENTIL) (2.5 MG/3ML) 0.083% nebulizer solution  Every 6 hours PRN        10/25/20 2214    For home use only DME Nebulizer machine        10/25/20 2214           Truddie Hidden, MD 10/25/20 2215

## 2020-10-25 NOTE — ED Triage Notes (Signed)
Pt c/o fever x 1 hour-103 at home-ibuprofen 800mg  ~2hours PTA-cough started last night

## 2020-10-29 ENCOUNTER — Inpatient Hospital Stay (HOSPITAL_COMMUNITY)
Admission: EM | Admit: 2020-10-29 | Discharge: 2020-11-01 | DRG: 193 | Disposition: A | Discharge status: Home / Self Care | Payer: PPO | Attending: Internal Medicine | Admitting: Internal Medicine

## 2020-10-29 ENCOUNTER — Emergency Department (HOSPITAL_COMMUNITY): Payer: PPO

## 2020-10-29 ENCOUNTER — Encounter (HOSPITAL_COMMUNITY): Payer: Self-pay

## 2020-10-29 ENCOUNTER — Other Ambulatory Visit: Payer: Self-pay

## 2020-10-29 DIAGNOSIS — E7800 Pure hypercholesterolemia, unspecified: Secondary | ICD-10-CM | POA: Diagnosis not present

## 2020-10-29 DIAGNOSIS — Z85819 Personal history of malignant neoplasm of unspecified site of lip, oral cavity, and pharynx: Secondary | ICD-10-CM

## 2020-10-29 DIAGNOSIS — I480 Paroxysmal atrial fibrillation: Secondary | ICD-10-CM | POA: Diagnosis not present

## 2020-10-29 DIAGNOSIS — I517 Cardiomegaly: Secondary | ICD-10-CM | POA: Diagnosis not present

## 2020-10-29 DIAGNOSIS — M109 Gout, unspecified: Secondary | ICD-10-CM

## 2020-10-29 DIAGNOSIS — Z930 Tracheostomy status: Secondary | ICD-10-CM

## 2020-10-29 DIAGNOSIS — G4733 Obstructive sleep apnea (adult) (pediatric): Secondary | ICD-10-CM | POA: Diagnosis present

## 2020-10-29 DIAGNOSIS — R042 Hemoptysis: Secondary | ICD-10-CM | POA: Diagnosis not present

## 2020-10-29 DIAGNOSIS — J129 Viral pneumonia, unspecified: Secondary | ICD-10-CM | POA: Diagnosis not present

## 2020-10-29 DIAGNOSIS — I10 Essential (primary) hypertension: Secondary | ICD-10-CM | POA: Diagnosis not present

## 2020-10-29 DIAGNOSIS — Z801 Family history of malignant neoplasm of trachea, bronchus and lung: Secondary | ICD-10-CM

## 2020-10-29 DIAGNOSIS — J101 Influenza due to other identified influenza virus with other respiratory manifestations: Secondary | ICD-10-CM

## 2020-10-29 DIAGNOSIS — Z20822 Contact with and (suspected) exposure to covid-19: Secondary | ICD-10-CM | POA: Diagnosis not present

## 2020-10-29 DIAGNOSIS — Z23 Encounter for immunization: Secondary | ICD-10-CM | POA: Diagnosis not present

## 2020-10-29 DIAGNOSIS — J111 Influenza due to unidentified influenza virus with other respiratory manifestations: Secondary | ICD-10-CM | POA: Diagnosis not present

## 2020-10-29 DIAGNOSIS — R0603 Acute respiratory distress: Secondary | ICD-10-CM | POA: Diagnosis not present

## 2020-10-29 DIAGNOSIS — J9811 Atelectasis: Secondary | ICD-10-CM | POA: Diagnosis not present

## 2020-10-29 DIAGNOSIS — C3490 Malignant neoplasm of unspecified part of unspecified bronchus or lung: Secondary | ICD-10-CM | POA: Diagnosis not present

## 2020-10-29 DIAGNOSIS — R230 Cyanosis: Secondary | ICD-10-CM | POA: Diagnosis present

## 2020-10-29 DIAGNOSIS — E872 Acidosis: Secondary | ICD-10-CM | POA: Diagnosis not present

## 2020-10-29 DIAGNOSIS — J9601 Acute respiratory failure with hypoxia: Secondary | ICD-10-CM

## 2020-10-29 DIAGNOSIS — J209 Acute bronchitis, unspecified: Secondary | ICD-10-CM | POA: Diagnosis not present

## 2020-10-29 DIAGNOSIS — Z923 Personal history of irradiation: Secondary | ICD-10-CM | POA: Diagnosis not present

## 2020-10-29 DIAGNOSIS — J988 Other specified respiratory disorders: Secondary | ICD-10-CM

## 2020-10-29 DIAGNOSIS — Z87891 Personal history of nicotine dependence: Secondary | ICD-10-CM | POA: Diagnosis not present

## 2020-10-29 DIAGNOSIS — J9602 Acute respiratory failure with hypercapnia: Secondary | ICD-10-CM | POA: Diagnosis present

## 2020-10-29 DIAGNOSIS — C320 Malignant neoplasm of glottis: Secondary | ICD-10-CM | POA: Diagnosis present

## 2020-10-29 DIAGNOSIS — K219 Gastro-esophageal reflux disease without esophagitis: Secondary | ICD-10-CM | POA: Diagnosis not present

## 2020-10-29 DIAGNOSIS — Z8521 Personal history of malignant neoplasm of larynx: Secondary | ICD-10-CM

## 2020-10-29 DIAGNOSIS — J440 Chronic obstructive pulmonary disease with acute lower respiratory infection: Secondary | ICD-10-CM | POA: Diagnosis not present

## 2020-10-29 DIAGNOSIS — J90 Pleural effusion, not elsewhere classified: Secondary | ICD-10-CM | POA: Diagnosis not present

## 2020-10-29 DIAGNOSIS — J1008 Influenza due to other identified influenza virus with other specified pneumonia: Principal | ICD-10-CM | POA: Diagnosis present

## 2020-10-29 DIAGNOSIS — R911 Solitary pulmonary nodule: Secondary | ICD-10-CM | POA: Diagnosis not present

## 2020-10-29 DIAGNOSIS — R000 Tachycardia, unspecified: Secondary | ICD-10-CM | POA: Diagnosis not present

## 2020-10-29 DIAGNOSIS — J811 Chronic pulmonary edema: Secondary | ICD-10-CM | POA: Diagnosis not present

## 2020-10-29 LAB — CBC WITH DIFFERENTIAL/PLATELET
Abs Immature Granulocytes: 0.04 10*3/uL (ref 0.00–0.07)
Abs Immature Granulocytes: 0.05 10*3/uL (ref 0.00–0.07)
Basophils Absolute: 0.1 10*3/uL (ref 0.0–0.1)
Basophils Absolute: 0 10*3/uL (ref 0.0–0.1)
Basophils Relative: 1 %
Basophils Relative: 0 %
Eosinophils Absolute: 0.1 10*3/uL (ref 0.0–0.5)
Eosinophils Absolute: 0 10*3/uL (ref 0.0–0.5)
Eosinophils Relative: 1 %
Eosinophils Relative: 0 %
HCT: 50.5 % (ref 39.0–52.0)
HCT: 50.6 % (ref 39.0–52.0)
Hemoglobin: 16.5 g/dL (ref 13.0–17.0)
Hemoglobin: 16.9 g/dL (ref 13.0–17.0)
Immature Granulocytes: 0 %
Immature Granulocytes: 1 %
Lymphocytes Relative: 20 %
Lymphocytes Relative: 6 %
Lymphs Abs: 2.5 10*3/uL (ref 0.7–4.0)
Lymphs Abs: 0.6 10*3/uL — ABNORMAL LOW (ref 0.7–4.0)
MCH: 30.7 pg (ref 26.0–34.0)
MCH: 30.7 pg (ref 26.0–34.0)
MCHC: 32.7 g/dL (ref 30.0–36.0)
MCHC: 33.4 g/dL (ref 30.0–36.0)
MCV: 94 fL (ref 80.0–100.0)
MCV: 91.8 fL (ref 80.0–100.0)
Monocytes Absolute: 1.4 10*3/uL — ABNORMAL HIGH (ref 0.1–1.0)
Monocytes Absolute: 0.3 10*3/uL (ref 0.1–1.0)
Monocytes Relative: 11 %
Monocytes Relative: 3 %
Neutro Abs: 8.1 10*3/uL — ABNORMAL HIGH (ref 1.7–7.7)
Neutro Abs: 8.5 10*3/uL — ABNORMAL HIGH (ref 1.7–7.7)
Neutrophils Relative %: 67 %
Neutrophils Relative %: 90 %
Platelets: 174 10*3/uL (ref 150–400)
Platelets: 153 10*3/uL (ref 150–400)
RBC: 5.37 MIL/uL (ref 4.22–5.81)
RBC: 5.51 MIL/uL (ref 4.22–5.81)
RDW: 13.3 % (ref 11.5–15.5)
RDW: 13.2 % (ref 11.5–15.5)
WBC: 12.1 10*3/uL — ABNORMAL HIGH (ref 4.0–10.5)
WBC: 9.4 10*3/uL (ref 4.0–10.5)
nRBC: 0 % (ref 0.0–0.2)
nRBC: 0 % (ref 0.0–0.2)

## 2020-10-29 LAB — BLOOD GAS, ARTERIAL
Acid-base deficit: 0.6 mmol/L (ref 0.0–2.0)
Bicarbonate: 24.7 mmol/L (ref 20.0–28.0)
FIO2: 98
O2 Saturation: 98.7 %
Patient temperature: 98.6
pCO2 arterial: 45.4 mmHg (ref 32.0–48.0)
pH, Arterial: 7.356 (ref 7.350–7.450)
pO2, Arterial: 179 mmHg — ABNORMAL HIGH (ref 83.0–108.0)

## 2020-10-29 LAB — BLOOD GAS, VENOUS
Acid-base deficit: 4.4 mmol/L — ABNORMAL HIGH (ref 0.0–2.0)
Bicarbonate: 28.3 mmol/L — ABNORMAL HIGH (ref 20.0–28.0)
O2 Saturation: 55.6 %
Patient temperature: 98.6
pCO2, Ven: 88.8 mmHg (ref 44.0–60.0)
pH, Ven: 7.13 — CL (ref 7.250–7.430)
pO2, Ven: 41.2 mmHg (ref 32.0–45.0)

## 2020-10-29 LAB — STREP PNEUMONIAE URINARY ANTIGEN: Strep Pneumo Urinary Antigen: NEGATIVE

## 2020-10-29 LAB — BASIC METABOLIC PANEL
Anion gap: 14 (ref 5–15)
Anion gap: 13 (ref 5–15)
BUN: 13 mg/dL (ref 8–23)
BUN: 15 mg/dL (ref 8–23)
CO2: 23 mmol/L (ref 22–32)
CO2: 24 mmol/L (ref 22–32)
Calcium: 8.6 mg/dL — ABNORMAL LOW (ref 8.9–10.3)
Calcium: 8.8 mg/dL — ABNORMAL LOW (ref 8.9–10.3)
Chloride: 100 mmol/L (ref 98–111)
Chloride: 103 mmol/L (ref 98–111)
Creatinine, Ser: 0.78 mg/dL (ref 0.61–1.24)
Creatinine, Ser: 0.78 mg/dL (ref 0.61–1.24)
GFR, Estimated: >60 mL/min (ref >60)
GFR, Estimated: >60 mL/min (ref >60)
Glucose, Bld: 200 mg/dL — ABNORMAL HIGH (ref 70–99)
Glucose, Bld: 119 mg/dL — ABNORMAL HIGH (ref 70–99)
Potassium: 3.7 mmol/L (ref 3.5–5.1)
Potassium: 4.3 mmol/L (ref 3.5–5.1)
Sodium: 137 mmol/L (ref 135–145)
Sodium: 140 mmol/L (ref 135–145)

## 2020-10-29 LAB — PROCALCITONIN: Procalcitonin: <0.1 ng/mL

## 2020-10-29 LAB — RESP PANEL BY RT-PCR (FLU A&B, COVID) ARPGX2
Influenza A by PCR: NEGATIVE
Influenza B by PCR: NEGATIVE
SARS Coronavirus 2 by RT PCR: NEGATIVE

## 2020-10-29 LAB — APTT: aPTT: 28 seconds (ref 24–36)

## 2020-10-29 LAB — HIV ANTIBODY (ROUTINE TESTING W REFLEX): HIV Screen 4th Generation wRfx: NONREACTIVE

## 2020-10-29 LAB — MAGNESIUM: Magnesium: 1.9 mg/dL (ref 1.7–2.4)

## 2020-10-29 LAB — PROTIME-INR
INR: 1.1 (ref 0.8–1.2)
Prothrombin Time: 14.1 seconds (ref 11.4–15.2)

## 2020-10-29 LAB — D-DIMER, QUANTITATIVE: D-Dimer, Quant: 0.86 ug/mL-FEU — ABNORMAL HIGH (ref 0.00–0.50)

## 2020-10-29 LAB — MRSA PCR SCREENING: MRSA by PCR: NEGATIVE

## 2020-10-29 MED ORDER — CHLORHEXIDINE GLUCONATE CLOTH 2 % EX PADS
6.0000 | MEDICATED_PAD | Freq: Every day | CUTANEOUS | Status: DC
  Administered 2020-10-29 – 2020-10-31 (×3): 6 via TOPICAL

## 2020-10-29 MED ORDER — METOPROLOL TARTRATE 5 MG/5ML IV SOLN
5.0000 mg | INTRAVENOUS | Status: AC | PRN
  Administered 2020-10-29 (×3): 5 mg via INTRAVENOUS
  Filled 2020-10-29 (×2): qty 5

## 2020-10-29 MED ORDER — OSELTAMIVIR PHOSPHATE 75 MG PO CAPS
75.0000 mg | ORAL_CAPSULE | Freq: Two times a day (BID) | ORAL | Status: AC
  Administered 2020-10-29 – 2020-10-30 (×4): 75 mg via ORAL
  Filled 2020-10-29 (×4): qty 1

## 2020-10-29 MED ORDER — ATORVASTATIN CALCIUM 10 MG PO TABS
20.0000 mg | ORAL_TABLET | Freq: Every day | ORAL | Status: DC
  Filled 2020-10-29 (×2): qty 2

## 2020-10-29 MED ORDER — IPRATROPIUM-ALBUTEROL 0.5-2.5 (3) MG/3ML IN SOLN
3.0000 mL | RESPIRATORY_TRACT | Status: DC
  Administered 2020-10-29: 08:00:00 3 mL via RESPIRATORY_TRACT
  Filled 2020-10-29 (×2): qty 3

## 2020-10-29 MED ORDER — PANTOPRAZOLE SODIUM 40 MG IV SOLR
40.0000 mg | Freq: Every day | INTRAVENOUS | Status: DC
  Administered 2020-10-29 – 2020-10-30 (×2): 40 mg via INTRAVENOUS
  Filled 2020-10-29: qty 40

## 2020-10-29 MED ORDER — IPRATROPIUM-ALBUTEROL 0.5-2.5 (3) MG/3ML IN SOLN: 3.0000 mL | RESPIRATORY_TRACT | Status: DC | PRN

## 2020-10-29 MED ORDER — ALLOPURINOL 100 MG PO TABS
200.0000 mg | ORAL_TABLET | Freq: Every day | ORAL | Status: DC
  Administered 2020-10-29 – 2020-11-01 (×3): 200 mg via ORAL
  Filled 2020-10-29 (×4): qty 2

## 2020-10-29 MED ORDER — TRANEXAMIC ACID FOR INHALATION
500.0000 mg | Freq: Three times a day (TID) | RESPIRATORY_TRACT | Status: DC
  Administered 2020-10-29 – 2020-10-30 (×4): 500 mg via RESPIRATORY_TRACT
  Filled 2020-10-29: qty 10
  Filled 2020-10-29 (×2): qty 5
  Filled 2020-10-29: qty 10
  Filled 2020-10-29: qty 5

## 2020-10-29 MED ORDER — METHYLPREDNISOLONE SODIUM SUCC 40 MG IJ SOLR
40.0000 mg | Freq: Two times a day (BID) | INTRAMUSCULAR | Status: DC
  Administered 2020-10-29 – 2020-11-01 (×7): 40 mg via INTRAVENOUS
  Filled 2020-10-29 (×7): qty 1

## 2020-10-29 MED ORDER — SODIUM CHLORIDE 0.9 % IV SOLN
500.0000 mg | INTRAVENOUS | Status: DC
  Administered 2020-10-29: 10:00:00 500 mg via INTRAVENOUS
  Filled 2020-10-29: qty 500

## 2020-10-29 MED ORDER — ORAL CARE MOUTH RINSE
15.0000 mL | Freq: Two times a day (BID) | OROMUCOSAL | Status: DC
  Administered 2020-10-29 – 2020-11-01 (×7): 15 mL via OROMUCOSAL

## 2020-10-29 MED ORDER — LEVALBUTEROL HCL 0.63 MG/3ML IN NEBU
0.6300 mg | INHALATION_SOLUTION | RESPIRATORY_TRACT | Status: DC
  Administered 2020-10-29 – 2020-10-31 (×10): 0.63 mg via RESPIRATORY_TRACT
  Filled 2020-10-29 (×10): qty 3

## 2020-10-29 MED ORDER — INFLUENZA VAC A&B SA ADJ QUAD 0.5 ML IM PRSY
0.5000 mL | PREFILLED_SYRINGE | INTRAMUSCULAR | Status: DC
  Filled 2020-10-29: qty 0.5

## 2020-10-29 MED ORDER — POLYETHYLENE GLYCOL 3350 17 G PO PACK: 17.0000 g | PACK | Freq: Every day | ORAL | Status: DC | PRN

## 2020-10-29 MED ORDER — DOCUSATE SODIUM 100 MG PO CAPS: 100.0000 mg | ORAL_CAPSULE | Freq: Two times a day (BID) | ORAL | Status: DC | PRN

## 2020-10-29 MED ORDER — CARVEDILOL 6.25 MG PO TABS
6.2500 mg | ORAL_TABLET | Freq: Two times a day (BID) | ORAL | Status: DC
  Administered 2020-10-29 (×2): 6.25 mg via ORAL
  Filled 2020-10-29: qty 1
  Filled 2020-10-29: qty 2

## 2020-10-29 MED ORDER — CARVEDILOL 12.5 MG PO TABS
12.5000 mg | ORAL_TABLET | Freq: Two times a day (BID) | ORAL | Status: DC
  Administered 2020-10-30 – 2020-11-01 (×5): 12.5 mg via ORAL
  Filled 2020-10-29 (×5): qty 1

## 2020-10-29 MED ORDER — SODIUM CHLORIDE 0.9 % IV SOLN
1.0000 g | INTRAVENOUS | Status: DC
  Administered 2020-10-29: 12:00:00 1 g via INTRAVENOUS
  Filled 2020-10-29: qty 1

## 2020-10-29 MED ORDER — PROMETHAZINE-CODEINE 6.25-10 MG/5ML PO SYRP
5.0000 mL | ORAL_SOLUTION | Freq: Four times a day (QID) | ORAL | Status: DC | PRN
  Administered 2020-10-29: 22:00:00 5 mL via ORAL
  Filled 2020-10-29: qty 5

## 2020-10-29 MED ORDER — LEVALBUTEROL HCL 0.63 MG/3ML IN NEBU
INHALATION_SOLUTION | RESPIRATORY_TRACT | Status: AC
  Administered 2020-10-29: 12:00:00 0.63 mg via RESPIRATORY_TRACT
  Filled 2020-10-29: qty 3

## 2020-10-29 MED ORDER — PANTOPRAZOLE SODIUM 40 MG IV SOLR
40.0000 mg | Freq: Every day | INTRAVENOUS | Status: DC
  Filled 2020-10-29: qty 40

## 2020-10-29 NOTE — Plan of Care (Addendum)
Afib with RVR this morning. Electrolytes WNL, on significant drop in Hb. Likely paroxysmal and previously undiagnosed. Unable to start [facility] due to bleeding. Metoprolol IV added for rate control, will increase baseline coreg dose for assistance with rate control. PCT undetectable, will d/c antibiotics.  Julian Hy, DO 10/29/20 4:28 PM [facility]

## 2020-10-29 NOTE — ED Provider Notes (Signed)
[facility] DEPT Provider Note: Georgena Spurling, MD, FACEP  CSN: [id number] MRN: [id number] ARRIVAL: 10/29/20 at [place]: [place]  Respiratory Distress  Level 5 Caveat: respiratory distress; laryngectomy HISTORY OF PRESENT ILLNESS  10/29/20 5:48 AM Alexander Ramirez is a 69 y.o. male who is about 2-1/2 years status post laryngectomy for laryngeal cancer.  He was seen in the ED 10/25/2020 and diagnosed with influenza A.  He was placed on Tamiflu.  He has had shortness of breath that has been worsening since the previous visit.  He has had to suction his tracheostomy frequently pulling out not only mucous plugs which is typical for him but also blood clots which is not typical for him.  His breathing became acutely severe just prior to arrival and his wife brought him to the ED.  I was called to the room on his arrival.  He is tachypneic, tachycardic, hypertensive and cyanotic and obvious respiratory distress.   Past Medical History:  Diagnosis Date  . Arthritis   . Cancer (HCC)    throat cancer  . GERD (gastroesophageal reflux disease)    OTC meds  . Gout   . Hemidiaphragm paralysis   . History of radiation therapy 05/26/2017- 07/02/2017   Larynx 63 Gy in 28 fractions.   . Hypercholesteremia   . Hypertension   . Kidney stones   . Primary localized osteoarthritis of right knee 12/09/2017  . Sleep apnea    uses CPAP nightly    Past Surgical History:  Procedure Laterality Date  . HERNIA REPAIR    . KIDNEY STONE SURGERY    . LARYNGECTOMY    . MICROLARYNGOSCOPY WITH CO2 LASER AND EXCISION OF VOCAL CORD LESION Left 04/28/2017   Procedure: MICROLARYNGOSCOPY WITH EXCISION LEFT VOCAL CORD MASS WITH CO2 LASER;  Surgeon: Izora Gala, MD;  Location: [facility];  Service: ENT;  Laterality: Left;  . PARTIAL KNEE ARTHROPLASTY Right 12/09/2017   Procedure: UNICOMPARTMENTAL KNEE;  Surgeon: Marchia Bond, MD;  Location: [facility];  Service: Orthopedics;   Laterality: Right;  . TONSILLECTOMY    . UVULOPALATOPHARYNGOPLASTY (UPPP)/TONSILLECTOMY/SEPTOPLASTY     for sleep apnea    Family History  Problem Relation Age of Onset  . Lung cancer Father        Passed at the age of 50    Social History   Tobacco Use  . Smoking status: Former Research scientist (life sciences)  . Smokeless tobacco: Never Used  Vaping Use  . Vaping Use: Never used  Substance Use Topics  . Alcohol use: Yes    Comment: occ  . Drug use: No    Prior to Admission medications   Medication Sig Start Date End Date Taking? Authorizing Provider  albuterol (PROVENTIL) (2.5 MG/3ML) 0.083% nebulizer solution Take 3 mLs (2.5 mg total) by nebulization every 6 (six) hours as needed for wheezing or shortness of breath. 10/25/20   Truddie Hidden, MD  allopurinol (ZYLOPRIM) 100 MG tablet Take 200 mg by mouth daily.    [provider]  carvedilol (COREG) 25 MG tablet Take 25 mg by mouth daily.    [provider]  lidocaine (XYLOCAINE) 2 % solution Patient: Mix 1part 2% viscous lidocaine, 1part H20. Swallow 64mL of diluted mixture, 40min before meals and at bedtime, up to QID Patient not taking: Reported on 07/16/2017 05/26/17   Eppie Gibson, MD  omeprazole (PRILOSEC OTC) 20 MG tablet Take 20 mg by mouth daily.    [provider]  oseltamivir (  TAMIFLU) 75 MG capsule Take 1 capsule (75 mg total) by mouth every 12 (twelve) hours. 10/25/20   Truddie Hidden, MD  telmisartan (MICARDIS) 80 MG tablet Take 80 mg by mouth daily.    [provider]  atorvastatin (LIPITOR) 40 MG tablet Take 20 mg by mouth daily.   10/25/20  [provider]    Allergies Patient has no known allergies.   REVIEW OF SYSTEMS  Negative except as noted here or in the History of Present Illness.   PHYSICAL EXAMINATION  Initial Vital Signs Blood pressure (!) 249/128, pulse (!) 108, temperature 97.9 F (36.6 C), temperature source Oral, resp. rate (!) 44, height 5\' 5"  (1.651 m), weight  90.7 kg, SpO2 98 %.  Examination General: Well-developed, well-nourished male in acute distress; appearance consistent with age of record HENT: normocephalic; atraumatic Eyes: Normal appearance Neck: supple; status post laryngectomy with open tracheostomy, Passy-Muir valve seen Heart: regular rate and rhythm with occasional ectopy; tachycardia Lungs: Tachypnea; coarse tracheal sounds Abdomen: soft; nondistended; nontender; bowel sounds present Extremities: No deformity; full range of motion; pulses normal Neurologic: Awake, alert; motor function intact in all extremities and symmetric; no facial droop Skin: Warm and dry; cyanotic with cool, pale fingertips Psychiatric: Agitated   RESULTS  Summary of this visit's results, reviewed and interpreted by myself:   EKG Interpretation  Date/Time:  Sunday October 29 2020 05:52:41 EST Ventricular Rate:  109 PR Interval:    QRS Duration: 91 QT Interval:  355 QTC Calculation: 476 R Axis:   4 Text Interpretation: Sinus tachycardia Atrial premature complex Minimal ST depression, lateral leads Borderline prolonged QT interval Rate is faster Confirmed by Shanon Rosser [phone]) on 10/29/2020 5:58:40 AM      Laboratory Studies: Results for orders placed or performed during the hospital encounter of 10/29/20 (from the past 24 hour(s))  CBC with Differential/Platelet     Status: Abnormal   Collection Time: 10/29/20  5:44 AM  Result Value Ref Range   WBC 12.1 (H) 4.0 - 10.5 K/uL   RBC 5.37 4.22 - 5.81 MIL/uL   Hemoglobin 16.5 13.0 - 17.0 g/dL   HCT 50.5 39.0 - 52.0 %   MCV 94.0 80.0 - 100.0 fL   MCH 30.7 26.0 - 34.0 pg   MCHC 32.7 30.0 - 36.0 g/dL   RDW 13.3 11.5 - 15.5 %   Platelets 174 150 - 400 K/uL   nRBC 0.0 0.0 - 0.2 %   Neutrophils Relative % 67 %   Neutro Abs 8.1 (H) 1.7 - 7.7 K/uL   Lymphocytes Relative 20 %   Lymphs Abs 2.5 0.7 - 4.0 K/uL   Monocytes Relative 11 %   Monocytes Absolute 1.4 (H) 0.1 - 1.0 K/uL   Eosinophils  Relative 1 %   Eosinophils Absolute 0.1 0.0 - 0.5 K/uL   Basophils Relative 1 %   Basophils Absolute 0.1 0.0 - 0.1 K/uL   Immature Granulocytes 0 %   Abs Immature Granulocytes 0.04 0.00 - 0.07 K/uL  Basic metabolic panel     Status: Abnormal   Collection Time: 10/29/20  5:44 AM  Result Value Ref Range   Sodium 137 135 - 145 mmol/L   Potassium 3.7 3.5 - 5.1 mmol/L   Chloride 100 98 - 111 mmol/L   CO2 23 22 - 32 mmol/L   Glucose, Bld 200 (H) 70 - 99 mg/dL   BUN 13 8 - 23 mg/dL   Creatinine, Ser 0.78 0.61 - 1.24 mg/dL   Calcium  8.6 (L) 8.9 - 10.3 mg/dL   GFR, Estimated >60 >60 mL/min   Anion gap 14 5 - 15  Blood gas, venous (at [facility], not at [facility])     Status: Abnormal (Preliminary result)   Collection Time: 10/29/20  5:44 AM  Result Value Ref Range   FIO2 PENDING    pH, Ven 7.130 (LL) 7.250 - 7.430   pCO2, Ven 88.8 (HH) 44.0 - 60.0 mmHg   pO2, Ven 41.2 32.0 - 45.0 mmHg   Bicarbonate 28.3 (H) 20.0 - 28.0 mmol/L   Acid-base deficit 4.4 (H) 0.0 - 2.0 mmol/L   O2 Saturation 55.6 %   Patient temperature 98.6   Resp Panel by RT-PCR (Flu A&B, Covid) Nasopharyngeal Swab     Status: None   Collection Time: 10/29/20  5:44 AM   Specimen: Nasopharyngeal Swab; Nasopharyngeal(NP) swabs in vial transport medium  Result Value Ref Range   SARS Coronavirus 2 by RT PCR NEGATIVE NEGATIVE   Influenza A by PCR NEGATIVE NEGATIVE   Influenza B by PCR NEGATIVE NEGATIVE   Imaging Studies: DG Chest Port 1 View  Result Date: 10/29/2020 CLINICAL DATA:  SOB EXAM: PORTABLE CHEST 1 VIEW COMPARISON:  10/25/2020 and prior FINDINGS: Diffuse interstitial prominence. No pneumothorax or pleural effusion. Patchy perihilar/basilar opacities, more conspicuous than prior exam. Cardiomegaly. Elevated left hemidiaphragm. Gaseous distension of left upper quadrant bowel is more conspicuous than prior exam. IMPRESSION: Interstitial prominence and perihilar/basilar opacities. Differential includes edema, atelectasis or  infection. Increased conspicuity of left upper quadrant bowel. Consider dedicated radiographs. Electronically Signed   By: Primitivo Gauze M.D.   On: 10/29/2020 06:21    ED COURSE and MDM  Nursing notes, initial and subsequent vitals signs, including pulse oximetry, reviewed and interpreted by myself.  Vitals:   10/29/20 0615 10/29/20 0630 10/29/20 0645 10/29/20 0700  BP: (!) 145/88 (!) 104/59 128/89 129/67  Pulse: 88 94 97 94  Resp: (!) 32 (!) 33 (!) 36 (!) 34  Temp:      TempSrc:      SpO2: 100% 100% 100% 100%  Weight:      Height:       Medications  docusate sodium (COLACE) capsule 100 mg (has no administration in time range)  polyethylene glycol (MIRALAX / GLYCOLAX) packet 17 g (has no administration in time range)  pantoprazole (PROTONIX) injection 40 mg (has no administration in time range)  ipratropium-albuterol (DUONEB) 0.5-2.5 (3) MG/3ML nebulizer solution 3 mL (has no administration in time range)    5:59 AM Patient's breathing improved after respiratory therapy suctioned mucus and clots from his trachea.  He continues to have bleeding and the pulmonologist on-call, Dr. Marchelle Gearing, was consulted.  She will see the patient in the ED. she requested ENT consult as well.  The patient has a persistently elevated left hemidiaphragm on his chest x-ray with some worsening opacity but no masses or pneumothorax seen.   PROCEDURES  Procedures CRITICAL CARE Performed by: Karen Chafe Samary Shatz Total critical care time: 60 minutes Critical care time was exclusive of separately billable procedures and treating other patients. Critical care was necessary to treat or prevent imminent or life-threatening deterioration. Critical care was time spent personally by me on the following activities: development of treatment plan with patient and/or surrogate as well as nursing, discussions with consultants, evaluation of patient's response to treatment, examination of patient, obtaining history  from patient or surrogate, ordering and performing treatments and interventions, ordering and review of laboratory studies, ordering and  review of radiographic studies, pulse oximetry and re-evaluation of patient's condition.   ED DIAGNOSES     ICD-10-CM   1. Respiratory distress  R06.03   2. Hemoptysis  R04.2   3. Partial obstruction of airway  J98.8   4. Influenza A with respiratory manifestations  J10.1   5. Acute hypercapnic respiratory failure ([place])  J96.02        Chantal Worthey, Jenny Reichmann, MD 10/29/20 [phone]

## 2020-10-29 NOTE — ED Notes (Signed)
RT currently giving neb treatments, will move to ICU in approximately 10 mins when complete.

## 2020-10-29 NOTE — ED Notes (Signed)
Date and time results received: 10/29/20 0600 (use smartphrase ".now" to insert current time)  Test: pH, pCO2 Critical Value: 7.130, 88.8  Name of Provider Notified: Shanon Rosser, MD  Orders Received? Or Actions Taken?:condition known

## 2020-10-29 NOTE — ED Notes (Signed)
Paged Pulmonology / [place] per [place] at 551am

## 2020-10-29 NOTE — Progress Notes (Signed)
Patient seen an examined in the ED. Per his wife, he looks much better currently. They were able to suction several hard plugs out of his airway. No baseline COPD or asthma. Had laryngectomy 04/2018.  BP 129/67   Pulse 94   Temp 97.9 F (36.6 C) (Oral)   Resp (!) 34   Ht 5\' 5"  (1.651 m)   Wt 90.7 kg   SpO2 100%   BMI 33.28 kg/m  No bloody secretions currently. Breathing comfortably on TC saturating in upper 90s%. Writing to communicate, awake and alert. Wife at bedside.   White oximeter forehead removed and connected to finger. RN updated. TXA nebs Q8h Holding DVT prophylaxis Hypertonic saline nebs + NTS PRN tamiflu Droplet precautions Repeat VBG or ABG in a few hours  Julian Hy, DO 10/29/20 7:36 AM [facility]

## 2020-10-29 NOTE — ED Triage Notes (Addendum)
Pt arrives POV in respiratory distress getting worse throughout the day. Tested positive for Influenza A. Laryngectomy from laryngeal cancer. Unable to get room air oxygen saturation. Finger tips and oral mucous membrane cyanotic. Best reading was 69% on room air but unsure of accuracy. Pt 100% with 15L NRB placed on trach.

## 2020-10-29 NOTE — H&P (Signed)
NAME:  Alexander Ramirez, MRN:  [id number], DOB:  Sep 11, 1951, LOS: 0 ADMISSION DATE:  10/29/2020, CONSULTATION DATE: 10/29/20 REFERRING MD:  [place], CHIEF COMPLAINT:  Shortness of breath, hemoptysis   Brief History   69 year old man with hx of laryngeal cancer, s/p complete laryngectomy 2019, here with recent influenza diagnosis (On Wednesday 12/8), shortness of breath and hemoptysis.    History of present illness   Frequent cough and mucous from stoma, more than is usual for him.  Started coughing blood clots, some more minimal bright red blood.  Increasing dyspnea last night, wife brought him to ED.    Past Medical History  HTN Gout GERD Laryngeal SCC S/p laryngectomy and xrt oA OSA Recent dyspnea on exertion.  r hemidiaphragm dysfunction  Significant Hospital Events     Consults:    Procedures:    Significant Diagnostic Tests:  cxr  IMPRESSION: Interstitial prominence and perihilar/basilar opacities. Differential includes edema, atelectasis or infection.  Increased conspicuity of left upper quadrant bowel. Consider dedicated radiographs.  Micro Data:   12/8 blood cultures negative Antimicrobials:    Interim history/subjective:    Objective   Blood pressure 128/89, pulse 97, temperature 97.9 F (36.6 C), temperature source Oral, resp. rate (!) 36, height 5\' 5"  (1.651 m), weight 90.7 kg, SpO2 100 %.    FiO2 (%):  [98 %] 98 %  No intake or output data in the 24 hours ending 10/29/20 0659 Filed Weights   10/29/20 0534  Weight: 90.7 kg    Examination: General: tachypneic but only minimal Increased WOB. Neck breather - breathes through s/p laryngectomy stoma.  15L oxygen to stoma.   HENT:as above.  Perrl, mmm Lungs: B insp and exp wheezing Cardiovascular:RRR no mgr  Abdomen: nt, nd, nbs Extremities: no edema Neuro: awake and alert.  Speaks with vocal device, I told him to write It as he is dependent on oxygen and has slight iincreased wob.   Resolved  Hospital Problem list     Assessment & Plan:  Influenza, hypoxemic hypercarbic respiratory failure: I suspect ABG will be improved after clearance of secretions and blood.  RT tells be a large clot was removed and patient has felt better since then.   Repeat ABG now that mucous has cleared and he is breathing easire.  Trial of nebs and steroids (systemic ) as he is wheezing. Hx of smoking, recent hx of DOE, posssible component of COPD Cont Tamiflu Check PT INR.   Stable right now, montior in ICU.  No bronch now, consider if bleeding again.    HTN: reduced dose coreg, can add back meds and home dose if needed.  Gout: allopurinol   Best practice (evaluated daily)   Diet: clear  Pain/Anxiety/Delirium protocol (if indicated):  VAP protocol (if indicated):  DVT prophylaxis: lovenox GI prophylaxis: protonix Glucose control:  Mobility: \ last date of multidisciplinary goals of care discussion Family and staff present  Summary of discussion  Follow up goals of care discussion due Code Status: Full  Disposition: ICU  Labs   CBC: Recent Labs  Lab 10/25/20 2119 10/29/20 0544  WBC 7.4 12.1*  NEUTROABS 5.6 8.1*  HGB 16.4 16.5  HCT 48.7 50.5  MCV 92.2 94.0  PLT 132* 409    Basic Metabolic Panel: Recent Labs  Lab 10/25/20 2119  NA 138  K 4.2  CL 105  CO2 25  GLUCOSE 98  BUN 16  CREATININE 0.94  CALCIUM 8.9   GFR: Estimated Creatinine Clearance: 76.8  mL/min (by C-G formula based on SCr of 0.94 mg/dL). Recent Labs  Lab 10/25/20 2119 10/29/20 0544  WBC 7.4 12.1*  LATICACIDVEN 1.2  --     Liver Function Tests: Recent Labs  Lab 10/25/20 2119  AST 48*  ALT 84*  ALKPHOS 73  BILITOT 0.5  PROT 7.8  ALBUMIN 4.3   No results for input(s): LIPASE, AMYLASE in the last 168 hours. No results for input(s): AMMONIA in the last 168 hours.  ABG    Component Value Date/Time   HCO3 28.3 (H) 10/29/2020 0544   TCO2 26 11/02/2010 1104   ACIDBASEDEF 4.4 (H) 10/29/2020  0544   O2SAT 55.6 10/29/2020 0544     Coagulation Profile: No results for input(s): INR, PROTIME in the last 168 hours.  Cardiac Enzymes: No results for input(s): CKTOTAL, CKMB, CKMBINDEX, TROPONINI in the last 168 hours.  HbA1C: No results found for: HGBA1C  CBG: No results for input(s): GLUCAP in the last 168 hours.  Review of Systems:   Unable to assess, non verbal right now  Past Medical History  He,  has a past medical history of Arthritis, Cancer ([place]), GERD (gastroesophageal reflux disease), Gout, Hemidiaphragm paralysis, History of radiation therapy (05/26/2017- 07/02/2017), Hypercholesteremia, Hypertension, Kidney stones, Primary localized osteoarthritis of right knee (12/09/2017), and Sleep apnea.   Surgical History    Past Surgical History:  Procedure Laterality Date  . HERNIA REPAIR    . KIDNEY STONE SURGERY    . LARYNGECTOMY    . MICROLARYNGOSCOPY WITH CO2 LASER AND EXCISION OF VOCAL CORD LESION Left 04/28/2017   Procedure: MICROLARYNGOSCOPY WITH EXCISION LEFT VOCAL CORD MASS WITH CO2 LASER;  Surgeon: Izora Gala, MD;  Location: [facility];  Service: ENT;  Laterality: Left;  . PARTIAL KNEE ARTHROPLASTY Right 12/09/2017   Procedure: UNICOMPARTMENTAL KNEE;  Surgeon: Marchia Bond, MD;  Location: [facility];  Service: Orthopedics;  Laterality: Right;  . TONSILLECTOMY    . UVULOPALATOPHARYNGOPLASTY (UPPP)/TONSILLECTOMY/SEPTOPLASTY     for sleep apnea     Social History   reports that he has quit smoking. He has never used smokeless tobacco. He reports current alcohol use. He reports that he does not use drugs.   Family History   His family history includes Lung cancer in his father.   Allergies No Known Allergies   Home Medications  Prior to Admission medications   Medication Sig Start Date End Date Taking? Authorizing Provider  albuterol (PROVENTIL) (2.5 MG/3ML) 0.083% nebulizer solution Take 3 mLs (2.5 mg total) by nebulization every 6 (six) hours as  needed for wheezing or shortness of breath. 10/25/20   Truddie Hidden, MD  allopurinol (ZYLOPRIM) 100 MG tablet Take 200 mg by mouth daily.    [provider]  carvedilol (COREG) 25 MG tablet Take 25 mg by mouth daily.    [provider]  lidocaine (XYLOCAINE) 2 % solution Patient: Mix 1part 2% viscous lidocaine, 1part H20. Swallow 45mL of diluted mixture, 62min before meals and at bedtime, up to QID Patient not taking: Reported on 07/16/2017 05/26/17   Eppie Gibson, MD  omeprazole (PRILOSEC OTC) 20 MG tablet Take 20 mg by mouth daily.    [provider]  oseltamivir (TAMIFLU) 75 MG capsule Take 1 capsule (75 mg total) by mouth every 12 (twelve) hours. 10/25/20   Truddie Hidden, MD  telmisartan (MICARDIS) 80 MG tablet Take 80 mg by mouth daily.    [provider]  atorvastatin (LIPITOR) 40 MG tablet Take 20 mg  by mouth daily.   10/25/20  [provider]     Critical care time: 51 min     Collier Bullock MD CCM

## 2020-10-29 NOTE — ED Notes (Signed)
MD and RRT present to assess patient condition.

## 2020-10-29 NOTE — Progress Notes (Signed)
Patient came into ED with SOB. RT's suctioned patient and got copious amounts of blood with clots.RT's will continue to monitor.

## 2020-10-30 LAB — CULTURE, BLOOD (ROUTINE X 2)
Culture: NO GROWTH
Culture: NO GROWTH
Specimen Description: ADEQUATE
Specimen Description: ADEQUATE

## 2020-10-30 LAB — PROCALCITONIN: Procalcitonin: 0.19 ng/mL

## 2020-10-30 LAB — CBC
HCT: 45.8 % (ref 39.0–52.0)
Hemoglobin: 15.1 g/dL (ref 13.0–17.0)
MCH: 30.6 pg (ref 26.0–34.0)
MCHC: 33 g/dL (ref 30.0–36.0)
MCV: 92.9 fL (ref 80.0–100.0)
Platelets: 187 10*3/uL (ref 150–400)
RBC: 4.93 MIL/uL (ref 4.22–5.81)
RDW: 13.2 % (ref 11.5–15.5)
WBC: 12.5 10*3/uL — ABNORMAL HIGH (ref 4.0–10.5)
nRBC: 0 % (ref 0.0–0.2)

## 2020-10-30 LAB — MAGNESIUM: Magnesium: 2 mg/dL (ref 1.7–2.4)

## 2020-10-30 LAB — BASIC METABOLIC PANEL
Anion gap: 14 (ref 5–15)
BUN: 23 mg/dL (ref 8–23)
CO2: 25 mmol/L (ref 22–32)
Calcium: 8.6 mg/dL — ABNORMAL LOW (ref 8.9–10.3)
Chloride: 102 mmol/L (ref 98–111)
Creatinine, Ser: 0.86 mg/dL (ref 0.61–1.24)
GFR, Estimated: >60 mL/min (ref >60)
Glucose, Bld: 144 mg/dL — ABNORMAL HIGH (ref 70–99)
Potassium: 3.5 mmol/L (ref 3.5–5.1)
Sodium: 141 mmol/L (ref 135–145)

## 2020-10-30 LAB — LEGIONELLA PNEUMOPHILA SEROGP 1 UR AG: L. pneumophila Serogp 1 Ur Ag: NEGATIVE

## 2020-10-30 LAB — PHOSPHORUS: Phosphorus: 2.6 mg/dL (ref 2.5–4.6)

## 2020-10-30 MED ORDER — IRBESARTAN 300 MG PO TABS
300.0000 mg | ORAL_TABLET | Freq: Every day | ORAL | Status: DC
  Administered 2020-10-30 – 2020-11-01 (×3): 300 mg via ORAL
  Filled 2020-10-30 (×3): qty 1

## 2020-10-30 MED ORDER — PANTOPRAZOLE SODIUM 40 MG PO TBEC
40.0000 mg | DELAYED_RELEASE_TABLET | Freq: Every day | ORAL | Status: DC
Start: 2020-10-31 — End: 2020-11-01
  Administered 2020-11-01: 09:00:00 40 mg via ORAL
  Filled 2020-10-30: qty 1

## 2020-10-30 MED ORDER — METOPROLOL TARTRATE 5 MG/5ML IV SOLN: 5.0000 mg | INTRAVENOUS | Status: DC | PRN

## 2020-10-30 NOTE — Progress Notes (Addendum)
NAME:  Alexander Ramirez, MRN:  [id number], DOB:  04/27/1951, LOS: 1 ADMISSION DATE:  10/29/2020, CONSULTATION DATE:  10/29/2020 REFERRING MD:  Dr Florina Ou, CHIEF COMPLAINT:  SOB, Hemoptysis    Brief History   69 year old man with hx of laryngeal cancer, s/p complete laryngectomy 2019, here with recent influenza diagnosis (On Wednesday 12/8), shortness of breath and hemoptysis.    History of present illness   Frequent cough and mucous from stoma, more than is usual for him.  Started coughing blood clots, some more minimal bright red blood.  Increasing dyspnea last night, wife brought him to ED.    Past Medical History  HTN Gout GERD Laryngeal SCC S/p laryngectomy and xrt OA OSA Recent dyspnea on exertion.  r hemidiaphragm dysfunction  Significant Hospital Events   12/12 > Presents to ED with respiratory distress, A.Fib RVR on arrival to ICU    Consults:    Procedures:    Significant Diagnostic Tests:  CXR 12/12 Interstitial prominence and perihilar/basilar opacities. Differential includes edema, atelectasis or infection. Increased conspicuity of left upper quadrant bowel. Consider dedicated radiographs  Micro Data:  MRSA PCR 12/12 > Negative  Strep P 12/12 > Negative Resp Quant 12/12 >>   Antimicrobials:    Interim history/subjective:  Denies shortness of breath. States improved hemoptysis, this AM was able to cough up large clot   Objective   Blood pressure 129/65, pulse 73, temperature 97.8 F (36.6 C), temperature source Oral, resp. rate 18, height 5\' 5"  (1.651 m), weight 96.3 kg, SpO2 96 %.    FiO2 (%):  [28 %-40 %] 28 %   Intake/Output Summary (Last 24 hours) at 10/30/2020 0928 Last data filed at 10/30/2020 0600 Gross per 24 hour  Intake 340 ml  Output 550 ml  Net -210 ml   Filed Weights   10/29/20 0534 10/29/20 0920 10/30/20 0500  Weight: 90.7 kg 96.4 kg 96.3 kg    Examination: General: adult male, sitting in chair no distress  HENT: Stoma noted.  Clean.  Lungs: Coarse breath sounds, no wheeze, no use of accessory muscles  Cardiovascular: RRR, no MRG  Abdomen: obese, soft, non-tender, active bowel sounds  Extremities: -edema Neuro: alert, oriented, follows commands  GU: deferred   Resolved Hospital Problem list     Assessment & Plan:   Acute Hypoxic/Hypecarbic Respiratory Failure in setting of Viral PNA, +Influenza (12/8) with hemoptysis > Improving. Repeated ABG with clearance of CO2 and normalized ph  Plan -Continue Scheduled Nebs and Solu-Medrol  -Complete Tamilu course  -Resp Quant pending  -Will need pulmonary follow up appointment made prior to d/c  -Will discuss possible bronch with attending   H/O Larynx Cancer s/p Total Laryngectomy, followed by Dr [place] Callas at [facility].Fib on arrival > resolved  Plan -Given Metoprolol 5 mg x 3 doses 12/12 -Cardiac Monitoring  -PRN Metoprolol for HR >110  HTN Plan -Home telmisartan >> irbesartan in lu of  -Continue home Coreg   Gout Plan -Continue allopurinol   Best practice (evaluated daily)   Diet: House  DVT prophylaxis: SCD GI prophylaxis: PPI Glucose control: Trend Mobility: OOB to Chair  last date of multidisciplinary goals of care discussion Family and staff present  Summary of discussion  Follow up goals of care discussion due Code Status: Full Code Disposition: Transfer to [facility]. Pulmonary will continue to follow   Labs   CBC: Recent Labs  Lab 10/25/20 2119 10/29/20 0544 10/29/20 1049  WBC 7.4 12.1* 9.4  NEUTROABS 5.6  8.1* 8.5*  HGB 16.4 16.5 16.9  HCT 48.7 50.5 50.6  MCV 92.2 94.0 91.8  PLT 132* 174 416    Basic Metabolic Panel: Recent Labs  Lab 10/25/20 2119 10/29/20 0544 10/29/20 1049  NA 138 137 140  K 4.2 3.7 4.3  CL 105 100 103  CO2 25 23 24   GLUCOSE 98 200* 119*  BUN 16 13 15   CREATININE 0.94 0.78 0.78  CALCIUM 8.9 8.6* 8.8*  MG  --   --  1.9   GFR: Estimated Creatinine Clearance: 92.9 mL/min (by C-G formula based on SCr of  0.78 mg/dL). Recent Labs  Lab 10/25/20 2119 10/29/20 0544 10/29/20 1049 10/30/20 0231  PROCALCITON  --  <0.10  --  0.19  WBC 7.4 12.1* 9.4  --   LATICACIDVEN 1.2  --   --   --     Liver Function Tests: Recent Labs  Lab 10/25/20 2119  AST 48*  ALT 84*  ALKPHOS 73  BILITOT 0.5  PROT 7.8  ALBUMIN 4.3   No results for input(s): LIPASE, AMYLASE in the last 168 hours. No results for input(s): AMMONIA in the last 168 hours.  ABG    Component Value Date/Time   PHART 7.356 10/29/2020 0710   PCO2ART 45.4 10/29/2020 0710   PO2ART 179 (H) 10/29/2020 0710   HCO3 24.7 10/29/2020 0710   TCO2 26 11/02/2010 1104   ACIDBASEDEF 0.6 10/29/2020 0710   O2SAT 98.7 10/29/2020 0710     Coagulation Profile: Recent Labs  Lab 10/29/20 0544  INR 1.1    Cardiac Enzymes: No results for input(s): CKTOTAL, CKMB, CKMBINDEX, TROPONINI in the last 168 hours.  HbA1C: No results found for: HGBA1C  CBG: No results for input(s): GLUCAP in the last 168 hours.  Review of Systems:   Denies SOB. +Cough with sputum production   Past Medical History  He,  has a past medical history of Arthritis, Cancer ([place]), GERD (gastroesophageal reflux disease), Gout, Hemidiaphragm paralysis, History of radiation therapy (05/26/2017- 07/02/2017), Hypercholesteremia, Hypertension, Kidney stones, Primary localized osteoarthritis of right knee (12/09/2017), and Sleep apnea.   Surgical History    Past Surgical History:  Procedure Laterality Date  . HERNIA REPAIR    . KIDNEY STONE SURGERY    . LARYNGECTOMY    . MICROLARYNGOSCOPY WITH CO2 LASER AND EXCISION OF VOCAL CORD LESION Left 04/28/2017   Procedure: MICROLARYNGOSCOPY WITH EXCISION LEFT VOCAL CORD MASS WITH CO2 LASER;  Surgeon: Izora Gala, MD;  Location: [facility];  Service: ENT;  Laterality: Left;  . PARTIAL KNEE ARTHROPLASTY Right 12/09/2017   Procedure: UNICOMPARTMENTAL KNEE;  Surgeon: Marchia Bond, MD;  Location: [facility];  Service:  Orthopedics;  Laterality: Right;  . TONSILLECTOMY    . UVULOPALATOPHARYNGOPLASTY (UPPP)/TONSILLECTOMY/SEPTOPLASTY     for sleep apnea     Social History   reports that he has quit smoking. He has never used smokeless tobacco. He reports current alcohol use. He reports that he does not use drugs.   Family History   His family history includes Lung cancer in his father.   Allergies No Known Allergies   Home Medications  Prior to Admission medications   Medication Sig Start Date End Date Taking? Authorizing Provider  albuterol (PROVENTIL) (2.5 MG/3ML) 0.083% nebulizer solution Take 3 mLs (2.5 mg total) by nebulization every 6 (six) hours as needed for wheezing or shortness of breath. 10/25/20  Yes Truddie Hidden, MD  allopurinol (ZYLOPRIM) 100 MG tablet Take 200 mg by mouth daily.  Yes [provider]  carvedilol (COREG) 25 MG tablet Take 25 mg by mouth 2 (two) times daily with a meal.   Yes [provider]  folic acid (FOLVITE) 1 MG tablet Take 1 mg by mouth daily. 08/08/20  Yes [provider]  omeprazole (PRILOSEC OTC) 20 MG tablet Take 20 mg by mouth daily.   Yes [provider]  oseltamivir (TAMIFLU) 75 MG capsule Take 1 capsule (75 mg total) by mouth every 12 (twelve) hours. 10/25/20  Yes Truddie Hidden, MD  telmisartan (MICARDIS) 80 MG tablet Take 80 mg by mouth daily.   Yes [provider]  lidocaine (XYLOCAINE) 2 % solution Patient: Mix 1part 2% viscous lidocaine, 1part H20. Swallow 81mL of diluted mixture, 68min before meals and at bedtime, up to QID Patient not taking: No sig reported 05/26/17   Eppie Gibson, MD  atorvastatin (LIPITOR) 40 MG tablet Take 20 mg by mouth daily.   10/25/20  [provider]     Hayden Pedro, AGACNP-BC [facility]  Pgr: [phone]  PCCM Pgr: [phone]

## 2020-10-30 NOTE — Plan of Care (Signed)
  Problem: Health Behavior/Discharge Planning: Goal: Ability to manage health-related needs will improve Outcome: Progressing   Problem: Clinical Measurements: Goal: Diagnostic test results will improve Outcome: Progressing   Problem: Clinical Measurements: Goal: Respiratory complications will improve Outcome: Progressing   Problem: Activity: Goal: Risk for activity intolerance will decrease Outcome: Progressing   Problem: Nutrition: Goal: Adequate nutrition will be maintained Outcome: Progressing   Problem: Activity: Goal: Ability to tolerate increased activity will improve Outcome: Progressing   Problem: Education: Goal: Knowledge about tracheostomy care/management will improve Outcome: Progressing   Problem: Respiratory: Goal: Patent airway maintenance will improve Outcome: Progressing

## 2020-10-30 NOTE — TOC Initial Note (Signed)
Transition of Care [facility]) - Initial/Assessment Note    Patient Details  Name: Alexander Ramirez MRN: [id number] Date of Birth: 04-Feb-1951  Transition of Care [facility]) CM/SW Contact:    Leeroy Cha, RN Phone Number: 10/30/2020, 7:42 AM  Clinical Narrative:                 69 year old man with hx of laryngeal cancer, s/p complete laryngectomy 2019, here with recent influenza diagnosis (On Wednesday 12/8), shortness of breath and hemoptysis.    History of present illness   Frequent cough and mucous from stoma, more than is usual for him.  Started coughing blood clots, some more minimal bright red blood.  Increasing dyspnea last night, wife brought him to ED.   Plan: to return to home with wife Following for progression.  Expected Discharge Plan: Home/Self Care Barriers to Discharge: No Barriers Identified   Patient Goals and CMS Choice Patient states their goals for this hospitalization and ongoing recovery are:: to go home CMS Medicare.gov Compare Post Acute Care list provided to:: Patient    Expected Discharge Plan and Services Expected Discharge Plan: Home/Self Care   Discharge Planning Services: CM Consult   Living arrangements for the past 2 months: Single Family Home                                      Prior Living Arrangements/Services Living arrangements for the past 2 months: Single Family Home Lives with:: Spouse Patient language and need for interpreter reviewed:: Yes Do you feel safe going back to the place where you live?: Yes      Need for Family Participation in Patient Care: Yes (Comment) (wife) Care giver support system in place?: Yes (comment) (wife)   Criminal Activity/Legal Involvement Pertinent to Current Situation/Hospitalization: No - Comment as needed  Activities of Daily Living Home Assistive Devices/Equipment: Cane (specify quad or straight),Eyeglasses ADL Screening (condition at time of admission) Patient's cognitive ability  adequate to safely complete daily activities?: Yes Is the patient deaf or have difficulty hearing?: No Does the patient have difficulty seeing, even when wearing glasses/contacts?: No Does the patient have difficulty concentrating, remembering, or making decisions?: No Patient able to express need for assistance with ADLs?: Yes Does the patient have difficulty dressing or bathing?: No Independently performs ADLs?: Yes (appropriate for developmental age) Does the patient have difficulty walking or climbing stairs?: Yes Weakness of Legs: Left Weakness of Arms/Hands: Left  Permission Sought/Granted                  Emotional Assessment Appearance:: Appears stated age Attitude/Demeanor/Rapport: Engaged Affect (typically observed): Quiet Orientation: : Oriented to Self,Oriented to Place,Oriented to  Time,Oriented to Situation Alcohol / Substance Use: Not Applicable Psych Involvement: No (comment)  Admission diagnosis:  Respiratory distress [R06.03] Influenza A with respiratory manifestations [J10.1] Hemoptysis [R04.2] Influenza [J11.1] Partial obstruction of airway [J98.8] Acute hypercapnic respiratory failure ([place]) [J96.02] Patient Active Problem List   Diagnosis Date Noted  . Influenza 10/29/2020  . Primary localized osteoarthritis of right knee 12/09/2017  . S/P knee replacement 12/09/2017  . Malignant neoplasm of glottis ([place]) 05/07/2017   PCP:  Colonel Bald, MD Pharmacy:   [facility], [place] - [address], [address], [place] [address] Phone: [phone] Fax: [phone]  [id number] - [place], [place]  [place] [address] Phone: [phone] Fax: [phone]     Social Determinants of Health (SDOH) Interventions    Readmission Risk Interventions No flowsheet data found.

## 2020-10-31 ENCOUNTER — Encounter (HOSPITAL_COMMUNITY): Payer: Self-pay | Admitting: Pulmonary Disease

## 2020-10-31 ENCOUNTER — Encounter (HOSPITAL_COMMUNITY)
Admission: EM | Disposition: A | Discharge status: Home / Self Care | Payer: Self-pay | Source: Home / Self Care | Attending: Critical Care Medicine

## 2020-10-31 DIAGNOSIS — J9602 Acute respiratory failure with hypercapnia: Secondary | ICD-10-CM

## 2020-10-31 HISTORY — PX: VIDEO BRONCHOSCOPY: SHX5072

## 2020-10-31 LAB — CBC
HCT: 44.4 % (ref 39.0–52.0)
Hemoglobin: 14.3 g/dL (ref 13.0–17.0)
MCH: 30.3 pg (ref 26.0–34.0)
MCHC: 32.2 g/dL (ref 30.0–36.0)
MCV: 94.1 fL (ref 80.0–100.0)
Platelets: 201 10*3/uL (ref 150–400)
RBC: 4.72 MIL/uL (ref 4.22–5.81)
RDW: 13.2 % (ref 11.5–15.5)
WBC: 15.7 10*3/uL — ABNORMAL HIGH (ref 4.0–10.5)
nRBC: 0 % (ref 0.0–0.2)

## 2020-10-31 LAB — BASIC METABOLIC PANEL
Anion gap: 12 (ref 5–15)
BUN: 28 mg/dL — ABNORMAL HIGH (ref 8–23)
CO2: 24 mmol/L (ref 22–32)
Calcium: 8.8 mg/dL — ABNORMAL LOW (ref 8.9–10.3)
Chloride: 105 mmol/L (ref 98–111)
Creatinine, Ser: 0.78 mg/dL (ref 0.61–1.24)
GFR, Estimated: >60 mL/min (ref >60)
Glucose, Bld: 178 mg/dL — ABNORMAL HIGH (ref 70–99)
Potassium: 4.2 mmol/L (ref 3.5–5.1)
Sodium: 141 mmol/L (ref 135–145)

## 2020-10-31 LAB — GLUCOSE, CAPILLARY: Glucose-Capillary: 117 mg/dL — ABNORMAL HIGH (ref 70–99)

## 2020-10-31 LAB — MAGNESIUM: Magnesium: 2.2 mg/dL (ref 1.7–2.4)

## 2020-10-31 LAB — PHOSPHORUS: Phosphorus: 2 mg/dL — ABNORMAL LOW (ref 2.5–4.6)

## 2020-10-31 LAB — PROCALCITONIN: Procalcitonin: 0.19 ng/mL

## 2020-10-31 SURGERY — VIDEO BRONCHOSCOPY WITHOUT FLUORO
Anesthesia: Moderate Sedation

## 2020-10-31 MED ORDER — INFLUENZA VAC A&B SA ADJ QUAD 0.5 ML IM PRSY
0.5000 mL | PREFILLED_SYRINGE | INTRAMUSCULAR | Status: AC | PRN
  Administered 2020-11-01: 15:00:00 0.5 mL via INTRAMUSCULAR
  Filled 2020-10-31: qty 0.5

## 2020-10-31 MED ORDER — POTASSIUM & SODIUM PHOSPHATES 280-160-250 MG PO PACK
1.0000 | PACK | Freq: Three times a day (TID) | ORAL | Status: AC
  Administered 2020-10-31 (×2): 1 via ORAL
  Filled 2020-10-31 (×3): qty 1

## 2020-10-31 MED ORDER — FENTANYL CITRATE (PF) 100 MCG/2ML IJ SOLN
INTRAMUSCULAR | Status: DC | PRN
  Administered 2020-10-31: 50 ug via INTRAVENOUS

## 2020-10-31 MED ORDER — LIDOCAINE HCL (PF) 4 % IJ SOLN
INTRAMUSCULAR | Status: DC | PRN
  Administered 2020-10-31: 4 mL via RESPIRATORY_TRACT

## 2020-10-31 MED ORDER — MIDAZOLAM HCL (PF) 10 MG/2ML IJ SOLN
INTRAMUSCULAR | Status: DC | PRN
  Administered 2020-10-31: 2 mg via INTRAVENOUS

## 2020-10-31 MED ORDER — LIDOCAINE HCL (PF) 4 % IJ SOLN
INTRAMUSCULAR | Status: AC
  Filled 2020-10-31: qty 5

## 2020-10-31 MED ORDER — ALBUTEROL SULFATE (2.5 MG/3ML) 0.083% IN NEBU
2.5000 mg | INHALATION_SOLUTION | Freq: Three times a day (TID) | RESPIRATORY_TRACT | Status: DC
  Administered 2020-10-31: 20:00:00 2.5 mg via RESPIRATORY_TRACT
  Filled 2020-10-31: qty 3

## 2020-10-31 MED ORDER — LIDOCAINE HCL 1 % IJ SOLN
INTRAMUSCULAR | Status: AC
  Filled 2020-10-31: qty 1

## 2020-10-31 MED ORDER — LIDOCAINE HCL URETHRAL/MUCOSAL 2 % EX GEL
CUTANEOUS | Status: AC
  Filled 2020-10-31: qty 30

## 2020-10-31 MED ORDER — FENTANYL CITRATE (PF) 100 MCG/2ML IJ SOLN
INTRAMUSCULAR | Status: AC
  Filled 2020-10-31: qty 4

## 2020-10-31 MED ORDER — ALBUTEROL SULFATE (2.5 MG/3ML) 0.083% IN NEBU: 2.5000 mg | INHALATION_SOLUTION | RESPIRATORY_TRACT | Status: DC | PRN

## 2020-10-31 MED ORDER — LIDOCAINE HCL URETHRAL/MUCOSAL 2 % EX GEL: 1.0000 "application " | Freq: Once | CUTANEOUS | Status: DC

## 2020-10-31 MED ORDER — BUTAMBEN-TETRACAINE-BENZOCAINE 2-2-14 % EX AERO: 1.0000 | INHALATION_SPRAY | Freq: Once | CUTANEOUS | Status: DC

## 2020-10-31 MED ORDER — MIDAZOLAM HCL (PF) 5 MG/ML IJ SOLN
INTRAMUSCULAR | Status: AC
  Filled 2020-10-31: qty 2

## 2020-10-31 MED ORDER — PHENYLEPHRINE HCL 0.25 % NA SOLN: 1.0000 | Freq: Four times a day (QID) | NASAL | Status: DC | PRN

## 2020-10-31 MED ORDER — PHENYLEPHRINE HCL 1 % NA SOLN
NASAL | Status: AC
  Filled 2020-10-31: qty 15

## 2020-10-31 MED ORDER — DIPHENHYDRAMINE HCL 50 MG/ML IJ SOLN
INTRAMUSCULAR | Status: AC
  Filled 2020-10-31: qty 1

## 2020-10-31 NOTE — Progress Notes (Addendum)
NAME:  Alexander Ramirez, MRN:  [id number], DOB:  [birth year], LOS: 2 ADMISSION DATE:  10/29/2020, CONSULTATION DATE:  10/29/2020 REFERRING MD:  Dr Florina Ou, CHIEF COMPLAINT:  SOB, Hemoptysis    Brief History   69 year old man with hx of laryngeal cancer, s/p complete laryngectomy 2019, here with recent influenza diagnosis (On Wednesday 12/8), shortness of breath and hemoptysis.    History of present illness   Frequent cough and mucous from stoma, more than is usual for him.  Started coughing blood clots, some more minimal bright red blood.  Increasing dyspnea last night, wife brought him to ED.    Past Medical History  HTN Gout GERD Laryngeal SCC S/p laryngectomy and xrt OA OSA Recent dyspnea on exertion.  r hemidiaphragm dysfunction  Significant Hospital Events   12/12 > Presents to ED with respiratory distress, A.Fib RVR on arrival to ICU    Consults:    Procedures:    Significant Diagnostic Tests:  CXR 12/12 Interstitial prominence and perihilar/basilar opacities. Differential includes edema, atelectasis or infection. Increased conspicuity of left upper quadrant bowel. Consider dedicated radiographs  Micro Data:  MRSA PCR 12/12 > Negative  Strep P 12/12 > Negative Resp Quant 12/12 >>   Antimicrobials:  12/12>> Tamiflu   Interim history/subjective:  Denies shortness of breath.  States he has not coughed up any further blood or clots this am.  Net negative 800 cc's  T Max 98.2 PCT 0.19 Remains on 28% ATC for humidification with sat of 98% BP is elevated 12/14 am WBC is 15.7 ( On Steroids) For Bronch today at 12:30   Objective   Blood pressure (!) 170/103, pulse 73, temperature 97.6 F (36.4 C), temperature source Oral, resp. rate (!) 24, height 5\' 5"  (1.651 m), weight 96.3 kg, SpO2 97 %.    FiO2 (%):  [28 %] 28 %   Intake/Output Summary (Last 24 hours) at 10/31/2020 0856 Last data filed at 10/31/2020 0600 Gross per 24 hour  Intake --  Output 600 ml  Net  -600 ml   Filed Weights   10/29/20 0534 10/29/20 0920 10/30/20 0500  Weight: 90.7 kg 96.4 kg 96.3 kg    Examination: General: adult male, sitting in chair no distress  HENT: Stoma noted. Clean.  Lungs: Bilateral chest excursion, Coarse breath sounds, few  Wheezes RUL, no use of accessory muscles , RR is 18 Cardiovascular: S1, S2, RRR, no MRG, brisk cap refill  Abdomen: obese, soft, non-tender, ND,active bowel sounds  Extremities: -edema, Brisk capillary refill, no obvious deformities Neuro: alert, oriented, follows commands , appropriate GU: deferred   Resolved Hospital Problem list     Assessment & Plan:   Acute Hypoxic/Hypecarbic Respiratory Failure in setting of Viral PNA, +Influenza (12/8) with hemoptysis > Improving. Repeated ABG with clearance of CO2 and normalized ph  Plan -Continue Scheduled Nebs and Solu-Medrol  -Complete Tamilu course  -Resp Quant pending  -Will need pulmonary follow up appointment made prior to d/c  -For bronch 12/14 at 12:30 - Monitor for worsening hemoptysis   H/O Larynx Cancer s/p Total Laryngectomy, followed by Dr De Witt Callas at [place].Fib on arrival > resolved  Plan -Given Metoprolol 5 mg x 3 doses 12/12 -Cardiac Monitoring  -PRN Metoprolol for HR >110  HTN Plan -Home telmisartan >> irbesartan in lu of  -Continue home Coreg   Gout Plan -Continue allopurinol   Best practice (evaluated daily)   Diet: House  DVT prophylaxis: SCD GI prophylaxis: PPI Glucose control: Trend Mobility:  OOB to Chair  last date of multidisciplinary goals of care discussion Family and staff present  Summary of discussion  Follow up goals of care discussion due Code Status: Full Code Disposition: Transfer to [facility]. Pulmonary will continue to follow   Labs   CBC: Recent Labs  Lab 10/25/20 2119 10/29/20 0544 10/29/20 1049 10/30/20 0936 10/31/20 0214  WBC 7.4 12.1* 9.4 12.5* 15.7*  NEUTROABS 5.6 8.1* 8.5*  --   --   HGB 16.4 16.5 16.9 15.1 14.3   HCT 48.7 50.5 50.6 45.8 44.4  MCV 92.2 94.0 91.8 92.9 94.1  PLT 132* 174 153 187 132    Basic Metabolic Panel: Recent Labs  Lab 10/25/20 2119 10/29/20 0544 10/29/20 1049 10/30/20 0936 10/31/20 0214  NA 138 137 140 141 141  K 4.2 3.7 4.3 3.5 4.2  CL 105 100 103 102 105  CO2 25 23 24 25 24   GLUCOSE 98 200* 119* 144* 178*  BUN 16 13 15 23  28*  CREATININE 0.94 0.78 0.78 0.86 0.78  CALCIUM 8.9 8.6* 8.8* 8.6* 8.8*  MG  --   --  1.9 2.0 2.2  PHOS  --   --   --  2.6 2.0*   GFR: Estimated Creatinine Clearance: 92.9 mL/min (by C-G formula based on SCr of 0.78 mg/dL). Recent Labs  Lab 10/25/20 2119 10/29/20 0544 10/29/20 1049 10/30/20 0231 10/30/20 0936 10/31/20 0214  PROCALCITON  --  <0.10  --  0.19  --  0.19  WBC 7.4 12.1* 9.4  --  12.5* 15.7*  LATICACIDVEN 1.2  --   --   --   --   --     Liver Function Tests: Recent Labs  Lab 10/25/20 2119  AST 48*  ALT 84*  ALKPHOS 73  BILITOT 0.5  PROT 7.8  ALBUMIN 4.3   No results for input(s): LIPASE, AMYLASE in the last 168 hours. No results for input(s): AMMONIA in the last 168 hours.  ABG    Component Value Date/Time   PHART 7.356 10/29/2020 0710   PCO2ART 45.4 10/29/2020 0710   PO2ART 179 (H) 10/29/2020 0710   HCO3 24.7 10/29/2020 0710   TCO2 26 11/02/2010 1104   ACIDBASEDEF 0.6 10/29/2020 0710   O2SAT 98.7 10/29/2020 0710     Coagulation Profile: Recent Labs  Lab 10/29/20 0544  INR 1.1    Cardiac Enzymes: No results for input(s): CKTOTAL, CKMB, CKMBINDEX, TROPONINI in the last 168 hours.  HbA1C: No results found for: HGBA1C  CBG: No results for input(s): GLUCAP in the last 168 hours.  Past Medical History  He,  has a past medical history of Arthritis, Cancer ([place]), GERD (gastroesophageal reflux disease), Gout, Hemidiaphragm paralysis, History of radiation therapy (05/26/2017- 07/02/2017), Hypercholesteremia, Hypertension, Kidney stones, Primary localized osteoarthritis of right knee (12/09/2017), and  Sleep apnea.   Surgical History    Past Surgical History:  Procedure Laterality Date  . HERNIA REPAIR    . KIDNEY STONE SURGERY    . LARYNGECTOMY    . MICROLARYNGOSCOPY WITH CO2 LASER AND EXCISION OF VOCAL CORD LESION Left 04/28/2017   Procedure: MICROLARYNGOSCOPY WITH EXCISION LEFT VOCAL CORD MASS WITH CO2 LASER;  Surgeon: Izora Gala, MD;  Location: [facility];  Service: ENT;  Laterality: Left;  . PARTIAL KNEE ARTHROPLASTY Right 12/09/2017   Procedure: UNICOMPARTMENTAL KNEE;  Surgeon: Marchia Bond, MD;  Location: [facility];  Service: Orthopedics;  Laterality: Right;  . TONSILLECTOMY    . UVULOPALATOPHARYNGOPLASTY (UPPP)/TONSILLECTOMY/SEPTOPLASTY     for  sleep apnea     Social History   reports that he has quit smoking. He has never used smokeless tobacco. He reports current alcohol use. He reports that he does not use drugs.   Family History   His family history includes Lung cancer in his father.   Allergies No Known Allergies   Home Medications  Prior to Admission medications   Medication Sig Start Date End Date Taking? Authorizing Provider  albuterol (PROVENTIL) (2.5 MG/3ML) 0.083% nebulizer solution Take 3 mLs (2.5 mg total) by nebulization every 6 (six) hours as needed for wheezing or shortness of breath. 10/25/20  Yes Truddie Hidden, MD  allopurinol (ZYLOPRIM) 100 MG tablet Take 200 mg by mouth daily.   Yes [provider]  carvedilol (COREG) 25 MG tablet Take 25 mg by mouth 2 (two) times daily with a meal.   Yes [provider]  folic acid (FOLVITE) 1 MG tablet Take 1 mg by mouth daily. 08/08/20  Yes [provider]  omeprazole (PRILOSEC OTC) 20 MG tablet Take 20 mg by mouth daily.   Yes [provider]  oseltamivir (TAMIFLU) 75 MG capsule Take 1 capsule (75 mg total) by mouth every 12 (twelve) hours. 10/25/20  Yes Truddie Hidden, MD  telmisartan (MICARDIS) 80 MG tablet Take 80 mg by mouth daily.   Yes [provider]  lidocaine (XYLOCAINE) 2 % solution Patient: Mix 1part 2% viscous lidocaine, 1part H20. Swallow 80mL of diluted mixture, 56min before meals and at bedtime, up to QID Patient not taking: No sig reported 05/26/17   Eppie Gibson, MD  atorvastatin (LIPITOR) 40 MG tablet Take 20 mg by mouth daily.   10/25/20  [provider]     Magdalen Spatz, MSN, AGACNP-BC [place] for personal pager 10/31/2020 9:11 AM

## 2020-10-31 NOTE — Progress Notes (Signed)
eLink Physician-Brief Progress Note Patient Name: Alexander Ramirez DOB: 01/21/1951 MRN: [id number]   Date of Service  10/31/2020  HPI/Events of Note  Hypophosphatemia - PO4--- = 2.0 and Creatinine = 0.78.   eICU Interventions  Replace PO4---.     Intervention Category Major Interventions: Electrolyte abnormality - evaluation and management  Jamita Mckelvin Eugene 10/31/2020, 5:05 AM

## 2020-10-31 NOTE — Progress Notes (Signed)
[facility]  Severe, acute inflammation throughout lungs.  Bronchial mucosa extremely abnormal throughout, consistent with inflammation/acute bronchitis from influenza (cause of bleeding) However, given risk of malignancy he needs close follow up in pulmonary clinic after hospital discharge.  Would benefit for treatment for likely COPD and monitoring for recurrent hemoptysis.    Roselie Awkward, MD [facility] Pager: [phone] Cell: [phone] If no response, call [phone]

## 2020-10-31 NOTE — Op Note (Signed)
[facility] Cardiopulmonary Patient Name: Alexander Ramirez Procedure Date: 10/31/2020 MRN: [id number] Attending MD: Juanito Doom , MD Date of Birth: 09/01/1951 CSN: [id number] Age: 69 Admit Type: Inpatient Ethnicity: Not Hispanic or Latino Procedure:             Bronchoscopy Indications:           Hemoptysis with abnormal CXR Providers:             Nathaneil Canary B. Lake Bells, MD, Cleda Daub, RN, Clyde Lundborg, RN, Lesia Sago, Technician Referring MD:           Medicines:             Midazolam 2 mg IV, Fentanyl 50 mcg IV Complications:         No immediate complications Estimated Blood Loss:  Estimated blood loss: none. Procedure:      Pre-Anesthesia Assessment:      - A History and Physical has been performed. Patient meds and allergies       have been reviewed. The risks and benefits of the procedure and the       sedation options and risks were discussed with the patient. All       questions were answered and informed consent was obtained. Patient       identification and proposed procedure were verified prior to the       procedure by the physician and the nurse in the pre-procedure area.       Mental Status Examination: normal. Airway Examination: status post       tracheostomy. Respiratory Examination: clear to auscultation. CV       Examination: normal. ASA Grade Assessment: II - A patient with mild       systemic disease. After reviewing the risks and benefits, the patient       was deemed in satisfactory condition to undergo the procedure. The       anesthesia plan was to use moderate sedation / analgesia (conscious       sedation). Immediately prior to administration of medications, the       patient was re-assessed for adequacy to receive sedatives. The heart       rate, respiratory rate, oxygen saturations, blood pressure, adequacy of       pulmonary ventilation, and response to care were monitored throughout       the  procedure. The physical status of the patient was re-assessed after       the procedure.      After obtaining informed consent, the bronchoscope was passed under       direct vision. Throughout the procedure, the patient's blood pressure,       pulse, and oxygen saturations were monitored continuously. the BF-H190       ([id number]) Olympus Bronchoscope was introduced through the tracheostomy       and advanced to the tracheobronchial tree. The procedure was       accomplished without difficulty. The patient tolerated the procedure       well. The total duration of the procedure was 2 minutes. Findings:      Bilateral Lung Abnormalities: Edema was found throughout the       tracheobronchial tree. Acute Bronchitis was found. Old blood was found       throughout the tracheobronchial tree. The bleeding site was  not       identified. It was not obstructing the airway. An area of friable mucosa       was found throughout the tracheobronchial tree. An area of granular       mucosa was found throughout the tracheobronchial tree. Impression:      - Hemoptysis with abnormal CXR      - Edema was present throughout the tracheobronchial tree.      - Acute bronchitis was found.      - Blood was present throughout the tracheobronchial tree.      - Friable mucosa was found throughout the tracheobronchial tree.      - Granular mucosa was found throughout the tracheobronchial tree.      - No specimens collected. Moderate Sedation:      Moderate (conscious) sedation was administered by the nurse and       supervised by the physician performing the procedure. The following       parameters were monitored: oxygen saturation, heart rate, blood       pressure, and response to care. Total physician intraservice time was 10       minutes. Recommendation:      - Follow up in clinic in 3-4 weeks. Procedure Code(s):      --- Professional ---      [phone], Tracheobronchoscopy through established tracheostomy  incision      [phone], Moderate sedation services provided by the same physician or       other qualified health care professional performing the diagnostic or       therapeutic service that the sedation supports, requiring the presence       of an independent trained observer to assist in the monitoring of the       patient's level of consciousness and physiological status; initial 15       minutes of intraservice time, patient age 60 years or older Diagnosis Code(s):      --- Professional ---      R04.2, Hemoptysis      R91.8, Other nonspecific abnormal finding of lung field      R09.89, Other specified symptoms and signs involving the circulatory and       respiratory systems      J20.9, Acute bronchitis, unspecified      J98.09, Other diseases of bronchus, not elsewhere classified CPT copyright 2019 [employer]. All rights reserved. The codes documented in this report are preliminary and upon coder review may  be revised to meet current compliance requirements. Norlene Campbell, MD Juanito Doom, MD 10/31/2020 1:50:47 PM This report has been signed electronically. Number of Addenda: 0 Scope In: Scope Out:

## 2020-10-31 NOTE — Progress Notes (Signed)
PROGRESS NOTE    Alexander Ramirez  UKG:[id number] DOB: September 14, 1951 DOA: 10/29/2020 PCP: Colonel Bald, MD   Brief Narrative: Alexander Ramirez is a 69 y.o. male with a history of hypertension, laryngeal cancer s/p laryngectomy, GERD, Gout. Patient presented secondary to dyspnea and hemoptysis in setting of influenza infection   Assessment & Plan:   Active Problems:   Influenza   Acute hypercapnic respiratory failure (HCC)  Influenza infection Diagnosed as an outpatient with worsened symptoms as mentioned below. Completed Tamiflu course.  Acute respiratory failure with hypoxia and hypercapnia Acidemia and hypercarbia noted on VBG.Secondary to above. Patient required suction for secretions in addition to suction of blood clot with improvement in status. Patient did not need mechanical ventilation. -Oxygen therapy as needed  Hemoptysis Likely secondary to influenza. Pulmonology plan for bronchoscopy today. -Hold anticoagulation -Bronchoscopy  Laryngeal cancer s/p laryngectomy Stable  Primary hypertension -Continue Coreg, irbesartan  GERD -Continue Protonix  Gout -Continue allopurinol   DVT prophylaxis: SCDs Code Status:   Code Status: Full Code Family Communication: None at bedside Disposition Plan: Transfer to medical floor today; discharge tomorrow pending bronchoscopy and PCCM recommendations   Consultants:   PCCM  Procedures:   None  Antimicrobials:  Tamiflu    Subjective: No issues this morning.  Objective: Vitals:   10/31/20 0200 10/31/20 0343 10/31/20 0800 10/31/20 0814  BP: 129/71  (!) 170/103   Pulse: 70 (!) 57 63 73  Resp: 19 (!) 24 (!) 24   Temp:      TempSrc:      SpO2: 94% 97% 97%   Weight:      Height:        Intake/Output Summary (Last 24 hours) at 10/31/2020 0842 Last data filed at 10/31/2020 0600 Gross per 24 hour  Intake --  Output 600 ml  Net -600 ml   Filed Weights   10/29/20 0534 10/29/20 0920 10/30/20 0500   Weight: 90.7 kg 96.4 kg 96.3 kg    Examination:  General exam: Appears calm and comfortable Respiratory system: Diminished breath sounds L>R. Trach collar. Respiratory effort normal. Cardiovascular system: S1 & S2 heard, RRR. No murmurs, rubs, gallops or clicks. Gastrointestinal system: Abdomen is nondistended, soft and nontender. No organomegaly or masses felt. Normal bowel sounds heard. Central nervous system: Alert and oriented. No focal neurological deficits. Musculoskeletal: No edema. No calf tenderness Skin: No cyanosis. No rashes Psychiatry: Judgement and insight appear normal. Mood & affect appropriate.     Data Reviewed: I have personally reviewed following labs and imaging studies  CBC Lab Results  Component Value Date   WBC 15.7 (H) 10/31/2020   RBC 4.72 10/31/2020   HGB 14.3 10/31/2020   HCT 44.4 10/31/2020   MCV 94.1 10/31/2020   MCH 30.3 10/31/2020   PLT 201 10/31/2020   MCHC 32.2 10/31/2020   RDW 13.2 10/31/2020   LYMPHSABS 0.6 (L) 10/29/2020   MONOABS 0.3 10/29/2020   EOSABS 0.0 10/29/2020   BASOSABS 0.0 76/28/3151     Last metabolic panel Lab Results  Component Value Date   NA 141 10/31/2020   K 4.2 10/31/2020   CL 105 10/31/2020   CO2 24 10/31/2020   BUN 28 (H) 10/31/2020   CREATININE 0.78 10/31/2020   GLUCOSE 178 (H) 10/31/2020   GFRNONAA >60 10/31/2020   GFRAA >60 12/10/2017   CALCIUM 8.8 (L) 10/31/2020   PHOS 2.0 (L) 10/31/2020   PROT 7.8 10/25/2020   ALBUMIN 4.3 10/25/2020   BILITOT 0.5 10/25/2020  ALKPHOS 73 10/25/2020   AST 48 (H) 10/25/2020   ALT 84 (H) 10/25/2020   ANIONGAP 12 10/31/2020    CBG (last 3)  No results for input(s): GLUCAP in the last 72 hours.   GFR: Estimated Creatinine Clearance: 92.9 mL/min (by C-G formula based on SCr of 0.78 mg/dL).  Coagulation Profile: Recent Labs  Lab 10/29/20 0544  INR 1.1    Recent Results (from the past 240 hour(s))  Resp Panel by RT-PCR (Flu A&B, Covid) Nasopharyngeal Swab      Status: Abnormal   Collection Time: 10/25/20  7:00 PM   Specimen: Nasopharyngeal Swab; Nasopharyngeal(NP) swabs in vial transport medium  Result Value Ref Range Status   SARS Coronavirus 2 by RT PCR NEGATIVE NEGATIVE Final    Comment: (NOTE) SARS-CoV-2 target nucleic acids are NOT DETECTED.  The SARS-CoV-2 RNA is generally detectable in upper respiratory specimens during the acute phase of infection. The lowest concentration of SARS-CoV-2 viral copies this assay can detect is 138 copies/mL. A negative result does not preclude SARS-Cov-2 infection and should not be used as the sole basis for treatment or other patient management decisions. A negative result may occur with  improper specimen collection/handling, submission of specimen other than nasopharyngeal swab, presence of viral mutation(s) within the areas targeted by this assay, and inadequate number of viral copies(<138 copies/mL). A negative result must be combined with clinical observations, patient history, and epidemiological information. The expected result is Negative.  Fact Sheet for Patients:  EntrepreneurPulse.com.au  Fact Sheet for Healthcare Providers:  IncredibleEmployment.be  This test is no t yet approved or cleared by the [place] FDA and  has been authorized for detection and/or diagnosis of SARS-CoV-2 by FDA under an Emergency Use Authorization (EUA). This EUA will remain  in effect (meaning this test can be used) for the duration of the COVID-19 declaration under Section 564(b)(1) of the Act, 21 U.S.C.section 360bbb-3(b)(1), unless the authorization is terminated  or revoked sooner.       Influenza A by PCR POSITIVE (A) NEGATIVE Final   Influenza B by PCR NEGATIVE NEGATIVE Final    Comment: (NOTE) The Xpert Xpress SARS-CoV-2/FLU/RSV plus assay is intended as an aid in the diagnosis of influenza from Nasopharyngeal swab specimens and should not be used as a  sole basis for treatment. Nasal washings and aspirates are unacceptable for Xpert Xpress SARS-CoV-2/FLU/RSV testing.  Fact Sheet for Patients: EntrepreneurPulse.com.au  Fact Sheet for Healthcare Providers: IncredibleEmployment.be  This test is not yet approved or cleared by the [place] FDA and has been authorized for detection and/or diagnosis of SARS-CoV-2 by FDA under an Emergency Use Authorization (EUA). This EUA will remain in effect (meaning this test can be used) for the duration of the COVID-19 declaration under Section 564(b)(1) of the Act, 21 U.S.C. section 360bbb-3(b)(1), unless the authorization is terminated or revoked.  Performed at [facility], [address] [place], [place] [address]   Culture, blood (routine x 2)     Status: None   Collection Time: 10/25/20  9:10 PM   Specimen: BLOOD  Result Value Ref Range Status   Specimen Description   Final    BLOOD Blood Culture adequate volume Performed at [facility], [address] [place], [place] [address]    Special Requests   Final    BOTTLES DRAWN AEROBIC AND ANAEROBIC RIGHT ANTECUBITAL Performed at [facility], [address], [place], [place] [address]    Culture  Final    NO GROWTH 5 DAYS Performed at [facility], [place] [address], [place], [place] [address]    Report Status 10/30/2020 FINAL  Final  Culture, blood (routine x 2)     Status: None   Collection Time: 10/25/20  9:20 PM   Specimen: BLOOD  Result Value Ref Range Status   Specimen Description   Final    BLOOD Blood Culture adequate volume Performed at [facility], [address] [place], [place] [address]    Special Requests   Final    BOTTLES DRAWN AEROBIC AND ANAEROBIC LEFT ANTECUBITAL Performed at [facility], [address] [place], [place] [address]    Culture   Final    NO GROWTH 5 DAYS Performed at [facility],  [place] [address] [place], [place] [address]    Report Status 10/30/2020 FINAL  Final  MRSA PCR Screening     Status: None   Collection Time: 10/29/20  5:20 AM   Specimen: Nasal Mucosa; Nasopharyngeal  Result Value Ref Range Status   MRSA by PCR NEGATIVE NEGATIVE Final    Comment:        The GeneXpert MRSA Assay (FDA approved for NASAL specimens only), is one component of a comprehensive MRSA colonization surveillance program. It is not intended to diagnose MRSA infection nor to guide or monitor treatment for MRSA infections. Performed at [facility], [place] [address], [place], [place] [address]   Resp Panel by RT-PCR (Flu A&B, Covid) Nasopharyngeal Swab     Status: None   Collection Time: 10/29/20  5:44 AM   Specimen: Nasopharyngeal Swab; Nasopharyngeal(NP) swabs in vial transport medium  Result Value Ref Range Status   SARS Coronavirus 2 by RT PCR NEGATIVE NEGATIVE Final    Comment: (NOTE) SARS-CoV-2 target nucleic acids are NOT DETECTED.  The SARS-CoV-2 RNA is generally detectable in upper respiratory specimens during the acute phase of infection. The lowest concentration of SARS-CoV-2 viral copies this assay can detect is 138 copies/mL. A negative result does not preclude SARS-Cov-2 infection and should not be used as the sole basis for treatment or other patient management decisions. A negative result may occur with  improper specimen collection/handling, submission of specimen other than nasopharyngeal swab, presence of viral mutation(s) within the areas targeted by this assay, and inadequate number of viral copies(<138 copies/mL). A negative result must be combined with clinical observations, patient history, and epidemiological information. The expected result is Negative.  Fact Sheet for Patients:  EntrepreneurPulse.com.au  Fact Sheet for Healthcare Providers:  IncredibleEmployment.be  This test is no t yet approved  or cleared by the [place] FDA and  has been authorized for detection and/or diagnosis of SARS-CoV-2 by FDA under an Emergency Use Authorization (EUA). This EUA will remain  in effect (meaning this test can be used) for the duration of the COVID-19 declaration under Section 564(b)(1) of the Act, 21 U.S.C.section 360bbb-3(b)(1), unless the authorization is terminated  or revoked sooner.       Influenza A by PCR NEGATIVE NEGATIVE Final   Influenza B by PCR NEGATIVE NEGATIVE Final    Comment: (NOTE) The Xpert Xpress SARS-CoV-2/FLU/RSV plus assay is intended as an aid in the diagnosis of influenza from Nasopharyngeal swab specimens and should not be used as a sole basis for treatment. Nasal washings and aspirates are unacceptable for Xpert Xpress SARS-CoV-2/FLU/RSV testing.  Fact Sheet for Patients: EntrepreneurPulse.com.au  Fact Sheet for Healthcare Providers:  IncredibleEmployment.be  This test is not yet approved or cleared by the [place] and has been authorized for detection and/or diagnosis of SARS-CoV-2 by FDA under an Emergency Use Authorization (EUA). This EUA will remain in effect (meaning this test can be used) for the duration of the COVID-19 declaration under Section 564(b)(1) of the Act, 21 U.S.C. section 360bbb-3(b)(1), unless the authorization is terminated or revoked.  Performed at [facility], [place] [address], [place],  [address]         Radiology Studies: No results found.      Scheduled Meds: . allopurinol  200 mg Oral Daily  . carvedilol  12.5 mg Oral BID WC  . Chlorhexidine Gluconate Cloth  6 each Topical Daily  . influenza vaccine adjuvanted  0.5 mL Intramuscular Tomorrow-1000  . irbesartan  300 mg Oral Daily  . levalbuterol  0.63 mg Nebulization Q4H  . mouth rinse  15 mL Mouth Rinse BID  . methylPREDNISolone (SOLU-MEDROL) injection  40 mg Intravenous Q12H  .  pantoprazole  40 mg Oral Daily  . potassium & sodium phosphates  1 packet Oral Q8H   Continuous Infusions:   LOS: 2 days     Cordelia Poche, MD [facility] 10/31/2020, 8:42 AM  If 7PM-7AM, please contact night-coverage [email or web address]

## 2020-10-31 NOTE — H&P (Signed)
[facility] PCCM  CC: coughing up blood  HPI: 69 y/o male with recently diagnosed influenza treated with tamiflu presented with hemoptysis.  Admitted to the ICU, coughed up a bunch of blood, clots.  Now improved.  Has a history of head and neck cancer and laryngectomy.  Here today for an airway exam given hemoptysis and high risk of malignancy.  Past Medical History:  Diagnosis Date  . Arthritis   . Cancer (HCC)    throat cancer  . GERD (gastroesophageal reflux disease)    OTC meds  . Gout   . Hemidiaphragm paralysis   . History of radiation therapy 05/26/2017- 07/02/2017   Larynx 63 Gy in 28 fractions.   . Hypercholesteremia   . Hypertension   . Kidney stones   . Primary localized osteoarthritis of right knee 12/09/2017  . Sleep apnea    uses CPAP nightly     Family History  Problem Relation Age of Onset  . Lung cancer Father        Passed at the age of 54     Social History   Socioeconomic History  . Marital status: Married    Spouse name: Not on file  . Number of children: Not on file  . Years of education: Not on file  . Highest education level: Not on file  Occupational History  . Not on file  Tobacco Use  . Smoking status: Former Research scientist (life sciences)  . Smokeless tobacco: Never Used  Vaping Use  . Vaping Use: Never used  Substance and Sexual Activity  . Alcohol use: Yes    Comment: occ  . Drug use: No  . Sexual activity: Not on file  Other Topics Concern  . Not on file  Social History Narrative  . Not on file   Social Determinants of Health   Financial Resource Strain: Not on file  Food Insecurity: Not on file  Transportation Needs: Not on file  Physical Activity: Not on file  Stress: Not on file  Social Connections: Not on file  Intimate Partner Violence: Not on file     No Known Allergies   @encmedstart @ Vitals:   10/31/20 1000 10/31/20 1100 10/31/20 1200 10/31/20 1218  BP:  (!) 165/88 (!) 156/72   Pulse: 64 (!) 46 (!) 59   Resp: (!) 27 15 (!) 25   Temp:   98  F (36.7 C)   TempSrc:   Oral   SpO2: 97% (!) 89% 97% 99%  Weight:      Height:       General:  Resting comfortably in bed HENT: NCAT trach stoma patent PULM: CTA B, normal effort CV: RRR, no mgr GI: BS+, soft, nontender MSK: normal bulk and tone Neuro: awake, alert, no distress, MAEW  CBC    Component Value Date/Time   WBC 15.7 (H) 10/31/2020 0214   RBC 4.72 10/31/2020 0214   HGB 14.3 10/31/2020 0214   HGB 14.9 10/31/2017 0926   HCT 44.4 10/31/2020 0214   HCT 45.7 10/31/2017 0926   PLT 201 10/31/2020 0214   PLT 145 10/31/2017 0926   MCV 94.1 10/31/2020 0214   MCV 93.5 10/31/2017 0926   MCH 30.3 10/31/2020 0214   MCHC 32.2 10/31/2020 0214   RDW 13.2 10/31/2020 0214   RDW 13.5 10/31/2017 0926   LYMPHSABS 0.6 (L) 10/29/2020 1049   LYMPHSABS 1.8 10/31/2017 0926   MONOABS 0.3 10/29/2020 1049   MONOABS 0.7 10/31/2017 0926   EOSABS 0.0 10/29/2020 1049   EOSABS 0.2  10/31/2017 0926   BASOSABS 0.0 10/29/2020 1049   BASOSABS 0.0 10/31/2017 0903   Impression: Influenza Hemoptysis At risk for lung cancer  Plan: Bronchoscopy with airway exam.  We discussed risks and benefits at length and he is willing to proceed.  Roselie Awkward, MD [place] [facility] Pager: [phone] Cell: [phone] If no response, call [phone]

## 2020-11-01 ENCOUNTER — Inpatient Hospital Stay (HOSPITAL_COMMUNITY): Payer: PPO

## 2020-11-01 DIAGNOSIS — M109 Gout, unspecified: Secondary | ICD-10-CM

## 2020-11-01 DIAGNOSIS — K219 Gastro-esophageal reflux disease without esophagitis: Secondary | ICD-10-CM

## 2020-11-01 DIAGNOSIS — I10 Essential (primary) hypertension: Secondary | ICD-10-CM

## 2020-11-01 DIAGNOSIS — I480 Paroxysmal atrial fibrillation: Secondary | ICD-10-CM

## 2020-11-01 DIAGNOSIS — R042 Hemoptysis: Secondary | ICD-10-CM

## 2020-11-01 LAB — COMPREHENSIVE METABOLIC PANEL
ALT: 51 U/L — ABNORMAL HIGH (ref 0–44)
AST: 29 U/L (ref 15–41)
Albumin: 3.3 g/dL — ABNORMAL LOW (ref 3.5–5.0)
Alkaline Phosphatase: 66 U/L (ref 38–126)
Anion gap: 11 (ref 5–15)
BUN: 27 mg/dL — ABNORMAL HIGH (ref 8–23)
CO2: 25 mmol/L (ref 22–32)
Calcium: 8.6 mg/dL — ABNORMAL LOW (ref 8.9–10.3)
Chloride: 105 mmol/L (ref 98–111)
Creatinine, Ser: 0.76 mg/dL (ref 0.61–1.24)
GFR, Estimated: >60 mL/min (ref >60)
Glucose, Bld: 155 mg/dL — ABNORMAL HIGH (ref 70–99)
Potassium: 4.2 mmol/L (ref 3.5–5.1)
Sodium: 141 mmol/L (ref 135–145)
Total Bilirubin: 0.5 mg/dL (ref 0.3–1.2)
Total Protein: 6.6 g/dL (ref 6.5–8.1)

## 2020-11-01 LAB — CBC WITH DIFFERENTIAL/PLATELET
Abs Immature Granulocytes: 0.07 10*3/uL (ref 0.00–0.07)
Basophils Absolute: 0 10*3/uL (ref 0.0–0.1)
Basophils Relative: 0 %
Eosinophils Absolute: 0 10*3/uL (ref 0.0–0.5)
Eosinophils Relative: 0 %
HCT: 44.4 % (ref 39.0–52.0)
Hemoglobin: 14.5 g/dL (ref 13.0–17.0)
Immature Granulocytes: 1 %
Lymphocytes Relative: 5 %
Lymphs Abs: 0.7 10*3/uL (ref 0.7–4.0)
MCH: 30.5 pg (ref 26.0–34.0)
MCHC: 32.7 g/dL (ref 30.0–36.0)
MCV: 93.5 fL (ref 80.0–100.0)
Monocytes Absolute: 0.5 10*3/uL (ref 0.1–1.0)
Monocytes Relative: 4 %
Neutro Abs: 13.1 10*3/uL — ABNORMAL HIGH (ref 1.7–7.7)
Neutrophils Relative %: 90 %
Platelets: 216 10*3/uL (ref 150–400)
RBC: 4.75 MIL/uL (ref 4.22–5.81)
RDW: 13.2 % (ref 11.5–15.5)
WBC: 14.5 10*3/uL — ABNORMAL HIGH (ref 4.0–10.5)
nRBC: 0 % (ref 0.0–0.2)

## 2020-11-01 LAB — MAGNESIUM: Magnesium: 2.2 mg/dL (ref 1.7–2.4)

## 2020-11-01 MED ORDER — PREDNISONE 20 MG PO TABS: 40.0000 mg | ORAL_TABLET | Freq: Every day | ORAL | Status: DC

## 2020-11-01 MED ORDER — ARFORMOTEROL TARTRATE 15 MCG/2ML IN NEBU: 15.0000 ug | INHALATION_SOLUTION | Freq: Two times a day (BID) | RESPIRATORY_TRACT | Status: DC

## 2020-11-01 MED ORDER — PREDNISONE 20 MG PO TABS
40.0000 mg | ORAL_TABLET | Freq: Every day | ORAL | 0 refills | Status: AC
Start: 2020-11-02 — End: 2020-11-06

## 2020-11-01 MED ORDER — CARVEDILOL 12.5 MG PO TABS
12.5000 mg | ORAL_TABLET | Freq: Once | ORAL | Status: AC
  Administered 2020-11-01: 12:00:00 12.5 mg via ORAL
  Filled 2020-11-01: qty 1

## 2020-11-01 MED ORDER — AMLODIPINE BESYLATE 5 MG PO TABS: 5.0000 mg | ORAL_TABLET | Freq: Every day | ORAL | 2 refills | Status: DC

## 2020-11-01 MED ORDER — CARVEDILOL 12.5 MG PO TABS: 25.0000 mg | ORAL_TABLET | Freq: Two times a day (BID) | ORAL | Status: DC

## 2020-11-01 MED ORDER — AMLODIPINE BESYLATE 5 MG PO TABS
5.0000 mg | ORAL_TABLET | Freq: Every day | ORAL | Status: DC
  Administered 2020-11-01: 15:00:00 5 mg via ORAL
  Filled 2020-11-01: qty 1

## 2020-11-01 MED ORDER — BUDESONIDE 0.5 MG/2ML IN SUSP: 0.5000 mg | Freq: Two times a day (BID) | RESPIRATORY_TRACT | 2 refills | Status: DC

## 2020-11-01 MED ORDER — ARFORMOTEROL TARTRATE 15 MCG/2ML IN NEBU: 15.0000 ug | INHALATION_SOLUTION | Freq: Two times a day (BID) | RESPIRATORY_TRACT | 2 refills | Status: DC

## 2020-11-01 MED ORDER — BUDESONIDE 0.5 MG/2ML IN SUSP: 0.5000 mg | Freq: Two times a day (BID) | RESPIRATORY_TRACT | Status: DC

## 2020-11-01 NOTE — Progress Notes (Signed)
Baseline oxygen saturation at rest on room air = 97%, at the end of ambulation in the hallway, pt's oxygen saturation dropped to 74% on room air. Returned to 94% on room air less than 2 minutes after completing ambulation.

## 2020-11-01 NOTE — Plan of Care (Signed)
  Problem: Clinical Measurements: Goal: Ability to maintain clinical measurements within normal limits will improve Outcome: Progressing Goal: Will remain free from infection Outcome: Progressing Goal: Diagnostic test results will improve Outcome: Progressing Goal: Respiratory complications will improve Outcome: Progressing Goal: Cardiovascular complication will be avoided Outcome: Progressing   Problem: Safety: Goal: Ability to remain free from injury will improve Outcome: Progressing

## 2020-11-01 NOTE — TOC Progression Note (Signed)
Transition of Care [facility]) - Progression Note    Patient Details  Name: Alexander Ramirez MRN: [id number] Date of Birth: 04-23-1951  Transition of Care [facility]) CM/SW Contact  Leeroy Cha, RN Phone Number: 11/01/2020, 11:41 AM  Clinical Narrative:    DME ORDERS: nebulizer  Trach collar with humidification. Above sent to [facility] and [facility].   Expected Discharge Plan: Home/Self Care Barriers to Discharge: No Barriers Identified  Expected Discharge Plan and Services Expected Discharge Plan: Home/Self Care   Discharge Planning Services: CM Consult   Living arrangements for the past 2 months: Single Family Home Expected Discharge Date: 10/30/20                                     Social Determinants of Health (SDOH) Interventions    Readmission Risk Interventions No flowsheet data found.

## 2020-11-01 NOTE — Progress Notes (Addendum)
NAME:  Alexander Ramirez, MRN:  [id number], DOB:  [birth year], LOS: 3 ADMISSION DATE:  10/29/2020, CONSULTATION DATE:  10/29/2020 REFERRING MD:  Dr Florina Ou, CHIEF COMPLAINT:  SOB, Hemoptysis    Brief History   69 year old man with hx of laryngeal cancer, s/p complete laryngectomy 2019, here with recent influenza diagnosis (On Wednesday 12/8), admitted 12/12 with increasing shortness of breath and hemoptysis.    Past Medical History  HTN Gout GERD Laryngeal SCC S/p laryngectomy and xrt OA OSA Recent dyspnea on exertion.  r hemidiaphragm dysfunction  Significant Hospital Events   12/12 Admit with respiratory distress, A.Fib RVR on arrival to ICU    Consults:    Procedures:    Significant Diagnostic Tests:  CXR 12/12 >> Interstitial prominence and perihilar/basilar opacities.  Micro Data:  MRSA PCR 12/12 > Negative  Strep P 12/12 > Negative COVID 12/12 >> negative  Influenza 12/12 >> negative for A/B  Antimicrobials:  Tamiflu 12/12 >>    Interim history/subjective:  Pt reports feeling much better. Notes one episode of cough with old dark bloody sputum. On ATC 28% Wife at bedside  Sitting in chair Afebrile   Objective   Blood pressure (!) 216/106, pulse 71, temperature 98.3 F (36.8 C), temperature source Oral, resp. rate 16, height 5\' 5"  (1.651 m), weight 95.3 kg, SpO2 99 %.    FiO2 (%):  [28 %] 28 %   Intake/Output Summary (Last 24 hours) at 11/01/2020 0955 Last data filed at 11/01/2020 0800 Gross per 24 hour  Intake --  Output 250 ml  Net -250 ml   Filed Weights   10/29/20 0920 10/30/20 0500 10/31/20 1245  Weight: 96.4 kg 96.3 kg 95.3 kg    Examination: General: adult male sitting up in chair in NAD, wife at bedside HEENT: MM pink/moist, neck stoma with covering, no drainage noted  Neuro: AAOx4, speech clear, MAE CV: s1s2 RRR, no m/r/g PULM: non-labored on 28%, lungs bilaterally with wheezing  GI: soft, bsx4 active  Extremities: warm/dry, no edema   Skin: no rashes or lesions  PCXR 12/15 >> images personally reviewed, improved vascular congestion, chronically elevated left hemi-diaphraghm.   Resolved Hospital Problem list     Assessment & Plan:   Acute Hypoxic/Hypecarbic Respiratory Failure in setting of Viral PNA, +Influenza (12/8) with Hemoptysis   Chronically Elevated L Hemi-Diaphragm.  -change to prednisone 40 mg PO QD x4 days  -will need pulmicort 0.5 mg and brovana 15 mcg BID neb at discharge (he has a nebulizer machine) -assess ambulatory O2 needs prior to discharge -DME humidity + trach collar needed for home at discharge, have ordered > he may not need oxygen but needs humidification moving forward based on airways during bronch  -completed tamiflu -follow for recurrent hemoptysis  -Pulmonary follow up arranged for post hospital visit and COPD evaluation with Dr. Erin Fulling  H/O Larynx Cancer s/p Total Laryngectomy Followed by Dr McLennan Callas at [facility] -follow up at [facility] post discharge  A.Fib on arrival > resolved  -per primary  -tele monitoring while inpatient   HTN -increase Coreg to home dose -add additional 12.5mg  x1 now -continue telmisartan > irbesartan while inpatient  -consider HCTZ at discharge, defer to primary / outpatient primary   Gout -per primary    Best practice (evaluated daily)  Diet: House  DVT prophylaxis: SCD GI prophylaxis: PPI Glucose control: Trend Mobility: OOB to Chair  Last date of multidisciplinary goals of care discussion Family and staff present  Summary of discussion  Follow  up goals of care discussion due Code Status: Full Code Disposition: Per TRH, await home DME equipment set up.  PCCM will be available PRN.  See outpatient recommendations as above  Labs   CBC: Recent Labs  Lab 10/25/20 2119 10/29/20 0544 10/29/20 1049 10/30/20 0936 10/31/20 0214 11/01/20 0207  WBC 7.4 12.1* 9.4 12.5* 15.7* 14.5*  NEUTROABS 5.6 8.1* 8.5*  --   --  13.1*  HGB 16.4 16.5 16.9 15.1 14.3  14.5  HCT 48.7 50.5 50.6 45.8 44.4 44.4  MCV 92.2 94.0 91.8 92.9 94.1 93.5  PLT 132* 174 153 187 201 389    Basic Metabolic Panel: Recent Labs  Lab 10/29/20 0544 10/29/20 1049 10/30/20 0936 10/31/20 0214 11/01/20 0207  NA 137 140 141 141 141  K 3.7 4.3 3.5 4.2 4.2  CL 100 103 102 105 105  CO2 23 24 25 24 25   GLUCOSE 200* 119* 144* 178* 155*  BUN 13 15 23  28* 27*  CREATININE 0.78 0.78 0.86 0.78 0.76  CALCIUM 8.6* 8.8* 8.6* 8.8* 8.6*  MG  --  1.9 2.0 2.2 2.2  PHOS  --   --  2.6 2.0*  --    GFR: Estimated Creatinine Clearance: 92.4 mL/min (by C-G formula based on SCr of 0.76 mg/dL). Recent Labs  Lab 10/25/20 2119 10/29/20 0544 10/29/20 1049 10/30/20 0231 10/30/20 0936 10/31/20 0214 11/01/20 0207  PROCALCITON  --  <0.10  --  0.19  --  0.19  --   WBC 7.4 12.1* 9.4  --  12.5* 15.7* 14.5*  LATICACIDVEN 1.2  --   --   --   --   --   --     Liver Function Tests: Recent Labs  Lab 10/25/20 2119 11/01/20 0207  AST 48* 29  ALT 84* 51*  ALKPHOS 73 66  BILITOT 0.5 0.5  PROT 7.8 6.6  ALBUMIN 4.3 3.3*   No results for input(s): LIPASE, AMYLASE in the last 168 hours. No results for input(s): AMMONIA in the last 168 hours.  ABG    Component Value Date/Time   PHART 7.356 10/29/2020 0710   PCO2ART 45.4 10/29/2020 0710   PO2ART 179 (H) 10/29/2020 0710   HCO3 24.7 10/29/2020 0710   TCO2 26 11/02/2010 1104   ACIDBASEDEF 0.6 10/29/2020 0710   O2SAT 98.7 10/29/2020 0710     Coagulation Profile: Recent Labs  Lab 10/29/20 0544  INR 1.1    Cardiac Enzymes: No results for input(s): CKTOTAL, CKMB, CKMBINDEX, TROPONINI in the last 168 hours.  HbA1C: No results found for: HGBA1C  CBG: Recent Labs  Lab 10/29/20 2126  GLUCAP [place], MSN, NP-C, AGACNP-BC [facility] 11/01/2020, 9:55 AM   Please see [email or web address] for pager details.

## 2020-11-01 NOTE — Discharge Summary (Signed)
Physician Discharge Summary  MOODY ROBBEN QIH:[id number] DOB: [birth year] DOA: 10/29/2020  PCP: Colonel Bald, MD  Admit date: 10/29/2020 Discharge date: 11/01/2020  Admitted From: Home Disposition:  Home  Recommendations for Outpatient Follow-up:  1. Follow up with PCP in 1 week 2. Follow up with pulmonology as scheduled on 12/23, 1/14  3. Follow-up with [facility] [place]  Discharge Condition: Stable CODE STATUS: Full code Diet recommendation:  Diet Orders (From admission, onward)    Start     Ordered   11/01/20 0000  Diet - low sodium heart healthy        11/01/20 1307   10/31/20 1636  Diet Heart Room service appropriate? Yes; Fluid consistency: Thin  Diet effective now       Question Answer Comment  Room service appropriate? Yes   Fluid consistency: Thin      10/31/20 1636         Brief/Interim Summary: Alexander Ramirez is a 69 year old male with past medical history significant for laryngeal cancer status post complete laryngectomy in 2019 who was admitted to ICU with chief complaint of shortness of breath, hemoptysis and influenza.  He was diagnosed with influenza and started on Tamiflu on 12/8.  In the emergency department, he had started coughing blood clots. He went into A. fib RVR on 12/12, started on IV metoprolol, has since converted to NSR.  He underwent bronchoscopy on 12/14 which revealed severe, acute inflammation throughout lungs.  Bronchial mucosa extremely abnormal throughout, consistent with inflammation/acute bronchitis from influenza. He was given tx with prednisone and neb treatments.   Discharge Diagnoses:  Principal Problem:   Acute respiratory failure with hypoxia and hypercapnia (HCC) Active Problems:   Malignant neoplasm of glottis (HCC)   Influenza   Hemoptysis   Essential hypertension   GERD (gastroesophageal reflux disease)   Gout   AF (paroxysmal atrial fibrillation) [facility])    Discharge Instructions  Discharge Instructions     Call MD for:  difficulty breathing, headache or visual disturbances   Complete by: As directed    Call MD for:  extreme fatigue   Complete by: As directed    Call MD for:  persistant dizziness or light-headedness   Complete by: As directed    Call MD for:  persistant nausea and vomiting   Complete by: As directed    Call MD for:  severe uncontrolled pain   Complete by: As directed    Call MD for:  temperature >100.4   Complete by: As directed    Diet - low sodium heart healthy   Complete by: As directed    Discharge instructions   Complete by: As directed    You were cared for by a hospitalist during your hospital stay. If you have any questions about your discharge medications or the care you received while you were in the hospital after you are discharged, you can call the unit and ask to speak with the hospitalist on call if the hospitalist that took care of you is not available. Once you are discharged, your primary care physician will handle any further medical issues. Please note that NO REFILLS for any discharge medications will be authorized once you are discharged, as it is imperative that you return to your primary care physician (or establish a relationship with a primary care physician if you do not have one) for your aftercare needs so that they can reassess your need for medications and monitor your lab values.   Increase activity  slowly   Complete by: As directed      Allergies as of 11/01/2020   No Known Allergies     Medication List    STOP taking these medications   lidocaine 2 % solution Commonly known as: XYLOCAINE   oseltamivir 75 MG capsule Commonly known as: TAMIFLU     TAKE these medications   albuterol (2.5 MG/3ML) 0.083% nebulizer solution Commonly known as: PROVENTIL Take 3 mLs (2.5 mg total) by nebulization every 6 (six) hours as needed for wheezing or shortness of breath.   allopurinol 100 MG tablet Commonly known as: ZYLOPRIM Take 200 mg by  mouth daily.   amLODipine 5 MG tablet Commonly known as: NORVASC Take 1 tablet (5 mg total) by mouth daily.   arformoterol 15 MCG/2ML Nebu Commonly known as: BROVANA Take 2 mLs (15 mcg total) by nebulization 2 (two) times daily.   budesonide 0.5 MG/2ML nebulizer solution Commonly known as: PULMICORT Take 2 mLs (0.5 mg total) by nebulization 2 (two) times daily.   carvedilol 25 MG tablet Commonly known as: COREG Take 25 mg by mouth 2 (two) times daily with a meal.   folic acid 1 MG tablet Commonly known as: FOLVITE Take 1 mg by mouth daily.   omeprazole 20 MG tablet Commonly known as: PRILOSEC OTC Take 20 mg by mouth daily.   predniSONE 20 MG tablet Commonly known as: DELTASONE Take 2 tablets (40 mg total) by mouth daily with breakfast for 4 days. Start taking on: November 02, 2020   telmisartan 80 MG tablet Commonly known as: MICARDIS Take 80 mg by mouth daily.            Durable Medical Equipment  (From admission, onward)         Start     Ordered   11/01/20 1132  For home use only DME Nebulizer/meds  Once       Question Answer Comment  Patient needs a nebulizer to treat with the following condition COPD (chronic obstructive pulmonary disease) ([place])   Length of Need Lifetime      11/01/20 1132   11/01/20 1132  For home use only DME Other see comment  Once       Comments: Needs trach collar with humidification  Question:  Length of Need  Answer:  Lifetime   11/01/20 1132          Follow-up Information    Martyn Ehrich, NP Follow up on 11/09/2020.   Specialty: Pulmonary Disease Why: Appt at 11:30. Please arrive at 11:15 for check in.  Contact information: [address] [place] [address] [phone]        Freddi Starr, MD Follow up on 12/01/2020.   Specialty: Pulmonary Disease Why: Appt at 3:00 PM. Please arrive at 2:45 PM for check in. Contact information: [address] [place]  [address] [phone]        Colin Benton, MD Follow up.   Specialty: Otolaryngology Contact information: [address] [facility] [place] [address] [phone]        Colonel Bald, MD. Schedule an appointment as soon as possible for a visit in 1 week(s).   Specialty: Internal Medicine Contact information: [place] [address] [phone]              No Known Allergies  Consultations:  PCCM admission   Procedures/Studies: DG CHEST PORT 1 VIEW  Result Date: 11/01/2020  CLINICAL DATA:  Influenza. EXAM: PORTABLE CHEST 1 VIEW COMPARISON:  10/29/2020 FINDINGS: 0444 hours. Stable asymmetric elevation left hemidiaphragm. Collapse/consolidative opacity noted adjacent to the elevated diaphragm. Tiny left upper lobe pulmonary nodule, seen to be calcified granuloma on lung cancer screening CT 10/30/2017. Probable minimal atelectasis at the right base. The cardio pericardial silhouette is enlarged. There is pulmonary vascular congestion without overt pulmonary edema. Bones are diffusely demineralized. IMPRESSION: Interval improvement in vascular congestion. Improved aeration of both lung bases. Electronically Signed   By: Misty Stanley M.D.   On: 11/01/2020 07:25   DG Chest Port 1 View  Result Date: 10/29/2020 CLINICAL DATA:  SOB EXAM: PORTABLE CHEST 1 VIEW COMPARISON:  10/25/2020 and prior FINDINGS: Diffuse interstitial prominence. No pneumothorax or pleural effusion. Patchy perihilar/basilar opacities, more conspicuous than prior exam. Cardiomegaly. Elevated left hemidiaphragm. Gaseous distension of left upper quadrant bowel is more conspicuous than prior exam. IMPRESSION: Interstitial prominence and perihilar/basilar opacities. Differential includes edema, atelectasis or infection. Increased conspicuity of left upper quadrant bowel. Consider dedicated radiographs. Electronically Signed   By: Primitivo Gauze M.D.   On: 10/29/2020  06:21   DG Chest Portable 1 View  Result Date: 10/25/2020 CLINICAL DATA:  Cough, fever. EXAM: PORTABLE CHEST 1 VIEW COMPARISON:  CT chest 10/30/2017. FINDINGS: Borderline enlarged cardiac silhouette. Marked elevation left hemidiaphragm. Overlying linear left basilar opacities. No visible pleural effusions or pneumothorax. No acute osseous abnormality. Gaseous dilation of stomach and colon in the partially imaged left upper abdomen. IMPRESSION: 1. Markedly elevated left hemidiaphragm. Overlying opacities most likely represent atelectasis, although aspiration and/or pneumonia is not excluded. 2. Gaseous dilation of stomach and colon in the left upper abdomen. Dedicated abdominal radiographs to further evaluate if clinically indicated. Electronically Signed   By: Margaretha Sheffield MD   On: 10/25/2020 19:44       Discharge Exam: Vitals:   11/01/20 1145 11/01/20 1200  BP:  (!) 168/52  Pulse:    Resp:  17  Temp:    SpO2: 99%     General: Pt is alert, awake, not in acute distress Cardiovascular: RRR, S1/S2 +, no edema Respiratory: CTA bilaterally, no wheezing, no rhonchi, no respiratory distress, no conversational dyspnea  Abdominal: Soft, NT, ND, bowel sounds + Extremities: no edema, no cyanosis Psych: Normal mood and affect, stable judgement and insight     The results of significant diagnostics from this hospitalization (including imaging, microbiology, ancillary and laboratory) are listed below for reference.     Microbiology: Recent Results (from the past 240 hour(s))  Resp Panel by RT-PCR (Flu A&B, Covid) Nasopharyngeal Swab     Status: Abnormal   Collection Time: 10/25/20  7:00 PM   Specimen: Nasopharyngeal Swab; Nasopharyngeal(NP) swabs in vial transport medium  Result Value Ref Range Status   SARS Coronavirus 2 by RT PCR NEGATIVE NEGATIVE Final    Comment: (NOTE) SARS-CoV-2 target nucleic acids are NOT DETECTED.  The SARS-CoV-2 RNA is generally detectable in upper  respiratory specimens during the acute phase of infection. The lowest concentration of SARS-CoV-2 viral copies this assay can detect is 138 copies/mL. A negative result does not preclude SARS-Cov-2 infection and should not be used as the sole basis for treatment or other patient management decisions. A negative result may occur with  improper specimen collection/handling, submission of specimen other than nasopharyngeal swab, presence of viral mutation(s) within the areas targeted by this assay, and inadequate number of viral copies(<138 copies/mL). A negative result must be combined with clinical observations, patient history,  and epidemiological information. The expected result is Negative.  Fact Sheet for Patients:  EntrepreneurPulse.com.au  Fact Sheet for Healthcare Providers:  IncredibleEmployment.be  This test is no t yet approved or cleared by the [place] FDA and  has been authorized for detection and/or diagnosis of SARS-CoV-2 by FDA under an Emergency Use Authorization (EUA). This EUA will remain  in effect (meaning this test can be used) for the duration of the COVID-19 declaration under Section 564(b)(1) of the Act, 21 U.S.C.section 360bbb-3(b)(1), unless the authorization is terminated  or revoked sooner.       Influenza A by PCR POSITIVE (A) NEGATIVE Final   Influenza B by PCR NEGATIVE NEGATIVE Final    Comment: (NOTE) The Xpert Xpress SARS-CoV-2/FLU/RSV plus assay is intended as an aid in the diagnosis of influenza from Nasopharyngeal swab specimens and should not be used as a sole basis for treatment. Nasal washings and aspirates are unacceptable for Xpert Xpress SARS-CoV-2/FLU/RSV testing.  Fact Sheet for Patients: EntrepreneurPulse.com.au  Fact Sheet for Healthcare Providers: IncredibleEmployment.be  This test is not yet approved or cleared by the [place] FDA and has been  authorized for detection and/or diagnosis of SARS-CoV-2 by FDA under an Emergency Use Authorization (EUA). This EUA will remain in effect (meaning this test can be used) for the duration of the COVID-19 declaration under Section 564(b)(1) of the Act, 21 U.S.C. section 360bbb-3(b)(1), unless the authorization is terminated or revoked.  Performed at [facility], [address] [place], [place] [address]   Culture, blood (routine x 2)     Status: None   Collection Time: 10/25/20  9:10 PM   Specimen: BLOOD  Result Value Ref Range Status   Specimen Description   Final    BLOOD Blood Culture adequate volume Performed at [facility], [address] [place], [place] [address]    Special Requests   Final    BOTTLES DRAWN AEROBIC AND ANAEROBIC RIGHT ANTECUBITAL Performed at [facility], [address] [place], [place] [address]    Culture   Final    NO GROWTH 5 DAYS Performed at [facility], [place] [address], [place], [place] [address]    Report Status 10/30/2020 FINAL  Final  Culture, blood (routine x 2)     Status: None   Collection Time: 10/25/20  9:20 PM   Specimen: BLOOD  Result Value Ref Range Status   Specimen Description   Final    BLOOD Blood Culture adequate volume Performed at [facility], [address] [place], [place] [address]    Special Requests   Final    BOTTLES DRAWN AEROBIC AND ANAEROBIC LEFT ANTECUBITAL Performed at [facility], [address] [place], [place] [address]    Culture   Final    NO GROWTH 5 DAYS Performed at [facility], [place] [address], [place], [place] [address]    Report Status 10/30/2020 FINAL  Final  MRSA PCR Screening     Status: None   Collection Time: 10/29/20  5:20 AM   Specimen: Nasal Mucosa; Nasopharyngeal  Result Value Ref Range Status   MRSA by PCR NEGATIVE NEGATIVE Final    Comment:        The GeneXpert MRSA Assay (FDA approved for NASAL  specimens only), is one component of a comprehensive MRSA colonization surveillance program. It is not intended to diagnose MRSA infection nor to guide or monitor  treatment for MRSA infections. Performed at [facility], [place] [address] [place], [place] [address]   Resp Panel by RT-PCR (Flu A&B, Covid) Nasopharyngeal Swab     Status: None   Collection Time: 10/29/20  5:44 AM   Specimen: Nasopharyngeal Swab; Nasopharyngeal(NP) swabs in vial transport medium  Result Value Ref Range Status   SARS Coronavirus 2 by RT PCR NEGATIVE NEGATIVE Final    Comment: (NOTE) SARS-CoV-2 target nucleic acids are NOT DETECTED.  The SARS-CoV-2 RNA is generally detectable in upper respiratory specimens during the acute phase of infection. The lowest concentration of SARS-CoV-2 viral copies this assay can detect is 138 copies/mL. A negative result does not preclude SARS-Cov-2 infection and should not be used as the sole basis for treatment or other patient management decisions. A negative result may occur with  improper specimen collection/handling, submission of specimen other than nasopharyngeal swab, presence of viral mutation(s) within the areas targeted by this assay, and inadequate number of viral copies(<138 copies/mL). A negative result must be combined with clinical observations, patient history, and epidemiological information. The expected result is Negative.  Fact Sheet for Patients:  EntrepreneurPulse.com.au  Fact Sheet for Healthcare Providers:  IncredibleEmployment.be  This test is no t yet approved or cleared by the [place] FDA and  has been authorized for detection and/or diagnosis of SARS-CoV-2 by FDA under an Emergency Use Authorization (EUA). This EUA will remain  in effect (meaning this test can be used) for the duration of the COVID-19 declaration under Section 564(b)(1) of the Act, 21 U.S.C.section 360bbb-3(b)(1),  unless the authorization is terminated  or revoked sooner.       Influenza A by PCR NEGATIVE NEGATIVE Final   Influenza B by PCR NEGATIVE NEGATIVE Final    Comment: (NOTE) The Xpert Xpress SARS-CoV-2/FLU/RSV plus assay is intended as an aid in the diagnosis of influenza from Nasopharyngeal swab specimens and should not be used as a sole basis for treatment. Nasal washings and aspirates are unacceptable for Xpert Xpress SARS-CoV-2/FLU/RSV testing.  Fact Sheet for Patients: EntrepreneurPulse.com.au  Fact Sheet for Healthcare Providers: IncredibleEmployment.be  This test is not yet approved or cleared by the [place] FDA and has been authorized for detection and/or diagnosis of SARS-CoV-2 by FDA under an Emergency Use Authorization (EUA). This EUA will remain in effect (meaning this test can be used) for the duration of the COVID-19 declaration under Section 564(b)(1) of the Act, 21 U.S.C. section 360bbb-3(b)(1), unless the authorization is terminated or revoked.  Performed at [facility], [place] [address] [place], [place] [address]      Labs: BNP (last 3 results) No results for input(s): BNP in the last 8760 hours. Basic Metabolic Panel: Recent Labs  Lab 10/29/20 0544 10/29/20 1049 10/30/20 0936 10/31/20 0214 11/01/20 0207  NA 137 140 141 141 141  K 3.7 4.3 3.5 4.2 4.2  CL 100 103 102 105 105  CO2 23 24 25 24 25   GLUCOSE 200* 119* 144* 178* 155*  BUN 13 15 23  28* 27*  CREATININE 0.78 0.78 0.86 0.78 0.76  CALCIUM 8.6* 8.8* 8.6* 8.8* 8.6*  MG  --  1.9 2.0 2.2 2.2  PHOS  --   --  2.6 2.0*  --    Liver Function Tests: Recent Labs  Lab 10/25/20 2119 11/01/20 0207  AST 48* 29  ALT 84* 51*  ALKPHOS 73 66  BILITOT 0.5 0.5  PROT 7.8 6.6  ALBUMIN 4.3 3.3*   No results for  input(s): LIPASE, AMYLASE in the last 168 hours. No results for input(s): AMMONIA in the last 168 hours. CBC: Recent Labs  Lab  10/25/20 2119 10/29/20 0544 10/29/20 1049 10/30/20 0936 10/31/20 0214 11/01/20 0207  WBC 7.4 12.1* 9.4 12.5* 15.7* 14.5*  NEUTROABS 5.6 8.1* 8.5*  --   --  13.1*  HGB 16.4 16.5 16.9 15.1 14.3 14.5  HCT 48.7 50.5 50.6 45.8 44.4 44.4  MCV 92.2 94.0 91.8 92.9 94.1 93.5  PLT 132* 174 153 187 201 216   Cardiac Enzymes: No results for input(s): CKTOTAL, CKMB, CKMBINDEX, TROPONINI in the last 168 hours. BNP: Invalid input(s): POCBNP CBG: Recent Labs  Lab 10/29/20 2126  GLUCAP 117*   D-Dimer No results for input(s): DDIMER in the last 72 hours. Hgb A1c No results for input(s): HGBA1C in the last 72 hours. Lipid Profile No results for input(s): CHOL, HDL, LDLCALC, TRIG, CHOLHDL, LDLDIRECT in the last 72 hours. Thyroid function studies No results for input(s): TSH, T4TOTAL, T3FREE, THYROIDAB in the last 72 hours.  Invalid input(s): FREET3 Anemia work up No results for input(s): VITAMINB12, FOLATE, FERRITIN, TIBC, IRON, RETICCTPCT in the last 72 hours. Urinalysis    Component Value Date/Time   COLORURINE YELLOW 08/02/2009 2311   APPEARANCEUR CLEAR 08/02/2009 2311   LABSPEC 1.016 08/02/2009 2311   PHURINE 6.0 08/02/2009 2311   GLUCOSEU NEGATIVE 08/02/2009 2311   HGBUR LARGE (A) 08/02/2009 2311   BILIRUBINUR NEGATIVE 08/02/2009 2311   KETONESUR NEGATIVE 08/02/2009 2311   PROTEINUR NEGATIVE 08/02/2009 2311   UROBILINOGEN 0.2 08/02/2009 2311   NITRITE NEGATIVE 08/02/2009 2311   LEUKOCYTESUR SMALL (A) 08/02/2009 2311   Sepsis Labs Invalid input(s): PROCALCITONIN,  WBC,  LACTICIDVEN Microbiology Recent Results (from the past 240 hour(s))  Resp Panel by RT-PCR (Flu A&B, Covid) Nasopharyngeal Swab     Status: Abnormal   Collection Time: 10/25/20  7:00 PM   Specimen: Nasopharyngeal Swab; Nasopharyngeal(NP) swabs in vial transport medium  Result Value Ref Range Status   SARS Coronavirus 2 by RT PCR NEGATIVE NEGATIVE Final    Comment: (NOTE) SARS-CoV-2 target nucleic acids are  NOT DETECTED.  The SARS-CoV-2 RNA is generally detectable in upper respiratory specimens during the acute phase of infection. The lowest concentration of SARS-CoV-2 viral copies this assay can detect is 138 copies/mL. A negative result does not preclude SARS-Cov-2 infection and should not be used as the sole basis for treatment or other patient management decisions. A negative result may occur with  improper specimen collection/handling, submission of specimen other than nasopharyngeal swab, presence of viral mutation(s) within the areas targeted by this assay, and inadequate number of viral copies(<138 copies/mL). A negative result must be combined with clinical observations, patient history, and epidemiological information. The expected result is Negative.  Fact Sheet for Patients:  EntrepreneurPulse.com.au  Fact Sheet for Healthcare Providers:  IncredibleEmployment.be  This test is no t yet approved or cleared by the [place] FDA and  has been authorized for detection and/or diagnosis of SARS-CoV-2 by FDA under an Emergency Use Authorization (EUA). This EUA will remain  in effect (meaning this test can be used) for the duration of the COVID-19 declaration under Section 564(b)(1) of the Act, 21 U.S.C.section 360bbb-3(b)(1), unless the authorization is terminated  or revoked sooner.       Influenza A by PCR POSITIVE (A) NEGATIVE Final   Influenza B by PCR NEGATIVE NEGATIVE Final    Comment: (NOTE) The Xpert Xpress SARS-CoV-2/FLU/RSV plus assay is intended as an aid in  the diagnosis of influenza from Nasopharyngeal swab specimens and should not be used as a sole basis for treatment. Nasal washings and aspirates are unacceptable for Xpert Xpress SARS-CoV-2/FLU/RSV testing.  Fact Sheet for Patients: EntrepreneurPulse.com.au  Fact Sheet for Healthcare Providers: IncredibleEmployment.be  This test is  not yet approved or cleared by the [place] FDA and has been authorized for detection and/or diagnosis of SARS-CoV-2 by FDA under an Emergency Use Authorization (EUA). This EUA will remain in effect (meaning this test can be used) for the duration of the COVID-19 declaration under Section 564(b)(1) of the Act, 21 U.S.C. section 360bbb-3(b)(1), unless the authorization is terminated or revoked.  Performed at [facility], [address] [place], [place] [address]   Culture, blood (routine x 2)     Status: None   Collection Time: 10/25/20  9:10 PM   Specimen: BLOOD  Result Value Ref Range Status   Specimen Description   Final    BLOOD Blood Culture adequate volume Performed at [facility], [address] [place], [place] [address]    Special Requests   Final    BOTTLES DRAWN AEROBIC AND ANAEROBIC RIGHT ANTECUBITAL Performed at [facility], [address] [place], [place] [address]    Culture   Final    NO GROWTH 5 DAYS Performed at [facility], [place] [address], [place], [place] [address]    Report Status 10/30/2020 FINAL  Final  Culture, blood (routine x 2)     Status: None   Collection Time: 10/25/20  9:20 PM   Specimen: BLOOD  Result Value Ref Range Status   Specimen Description   Final    BLOOD Blood Culture adequate volume Performed at [facility], [address] [place], [place] [address]    Special Requests   Final    BOTTLES DRAWN AEROBIC AND ANAEROBIC LEFT ANTECUBITAL Performed at [facility], [address] [place], [place] [address]    Culture   Final    NO GROWTH 5 DAYS Performed at [facility], [place] [address], [place], [place] [address]    Report Status 10/30/2020 FINAL  Final  MRSA PCR Screening     Status: None   Collection Time: 10/29/20  5:20 AM   Specimen: Nasal Mucosa; Nasopharyngeal  Result Value Ref Range Status   MRSA by PCR NEGATIVE NEGATIVE Final    Comment:         The GeneXpert MRSA Assay (FDA approved for NASAL specimens only), is one component of a comprehensive MRSA colonization surveillance program. It is not intended to diagnose MRSA infection nor to guide or monitor treatment for MRSA infections. Performed at [facility], [place] [address], [place],  [address]   Resp Panel by RT-PCR (Flu A&B, Covid) Nasopharyngeal Swab     Status: None   Collection Time: 10/29/20  5:44 AM   Specimen: Nasopharyngeal Swab; Nasopharyngeal(NP) swabs in vial transport medium  Result Value Ref Range Status   SARS Coronavirus 2 by RT PCR NEGATIVE NEGATIVE Final    Comment: (NOTE) SARS-CoV-2 target nucleic acids are NOT DETECTED.  The SARS-CoV-2 RNA is generally detectable in upper respiratory specimens during the acute phase of infection. The lowest concentration of SARS-CoV-2 viral copies this assay can detect is 138 copies/mL. A negative result does not preclude SARS-Cov-2 infection and should not be used as the sole basis for treatment or other patient  management decisions. A negative result may occur with  improper specimen collection/handling, submission of specimen other than nasopharyngeal swab, presence of viral mutation(s) within the areas targeted by this assay, and inadequate number of viral copies(<138 copies/mL). A negative result must be combined with clinical observations, patient history, and epidemiological information. The expected result is Negative.  Fact Sheet for Patients:  EntrepreneurPulse.com.au  Fact Sheet for Healthcare Providers:  IncredibleEmployment.be  This test is no t yet approved or cleared by the [place] FDA and  has been authorized for detection and/or diagnosis of SARS-CoV-2 by FDA under an Emergency Use Authorization (EUA). This EUA will remain  in effect (meaning this test can be used) for the duration of the COVID-19 declaration under Section  564(b)(1) of the Act, 21 U.S.C.section 360bbb-3(b)(1), unless the authorization is terminated  or revoked sooner.       Influenza A by PCR NEGATIVE NEGATIVE Final   Influenza B by PCR NEGATIVE NEGATIVE Final    Comment: (NOTE) The Xpert Xpress SARS-CoV-2/FLU/RSV plus assay is intended as an aid in the diagnosis of influenza from Nasopharyngeal swab specimens and should not be used as a sole basis for treatment. Nasal washings and aspirates are unacceptable for Xpert Xpress SARS-CoV-2/FLU/RSV testing.  Fact Sheet for Patients: EntrepreneurPulse.com.au  Fact Sheet for Healthcare Providers: IncredibleEmployment.be  This test is not yet approved or cleared by the [place] FDA and has been authorized for detection and/or diagnosis of SARS-CoV-2 by FDA under an Emergency Use Authorization (EUA). This EUA will remain in effect (meaning this test can be used) for the duration of the COVID-19 declaration under Section 564(b)(1) of the Act, 21 U.S.C. section 360bbb-3(b)(1), unless the authorization is terminated or revoked.  Performed at [facility], [place] [address], [place], [place] [address]      Patient was seen and examined on the day of discharge and was found to be in stable condition. Time coordinating discharge: 35 minutes including assessment and coordination of care, as well as examination of the patient.   SIGNED:  Dessa Phi, DO [facility] 11/01/2020, 1:12 PM

## 2020-11-01 NOTE — TOC Progression Note (Signed)
Transition of Care [facility]) - Progression Note    Patient Details  Name: Alexander Ramirez MRN: [id number] Date of Birth: 06/19/1951  Transition of [facility]) CM/SW Contact  Leeroy Cha, RN Phone Number: 11/01/2020, 2:26 PM  Clinical Narrative:     zack with [employer] notified of o2 need and liters  Expected Discharge Plan: Home/Self Care Barriers to Discharge: No Barriers Identified  Expected Discharge Plan and Services Expected Discharge Plan: Home/Self Care   Discharge Planning Services: CM Consult Post Acute Care Choice: Durable Medical Equipment Living arrangements for the past 2 months: Single Family Home Expected Discharge Date: 11/01/20               DME Arranged: Oxygen DME Agency: [employer] Date DME Agency Contacted: 11/01/20 Time DME Agency Contacted: [phone] Representative spoke with at DME Agency: zack             Social Determinants of Health ([place]) Interventions    Readmission Risk Interventions No flowsheet data found.

## 2020-11-02 DIAGNOSIS — I480 Paroxysmal atrial fibrillation: Secondary | ICD-10-CM | POA: Diagnosis not present

## 2020-11-02 DIAGNOSIS — Z930 Tracheostomy status: Secondary | ICD-10-CM | POA: Diagnosis not present

## 2020-11-02 DIAGNOSIS — I10 Essential (primary) hypertension: Secondary | ICD-10-CM | POA: Diagnosis not present

## 2020-11-02 DIAGNOSIS — C320 Malignant neoplasm of glottis: Secondary | ICD-10-CM | POA: Diagnosis not present

## 2020-11-02 DIAGNOSIS — J111 Influenza due to unidentified influenza virus with other respiratory manifestations: Secondary | ICD-10-CM | POA: Diagnosis not present

## 2020-11-09 ENCOUNTER — Other Ambulatory Visit: Payer: Self-pay

## 2020-11-09 ENCOUNTER — Ambulatory Visit (INDEPENDENT_AMBULATORY_CARE_PROVIDER_SITE_OTHER): Payer: PPO | Admitting: Primary Care

## 2020-11-09 ENCOUNTER — Encounter: Payer: Self-pay | Admitting: Primary Care

## 2020-11-09 VITALS — BP 130/80 | HR 67 | Ht 65.0 in | Wt 219.8 lb

## 2020-11-09 DIAGNOSIS — J42 Unspecified chronic bronchitis: Secondary | ICD-10-CM | POA: Diagnosis not present

## 2020-11-09 DIAGNOSIS — J449 Chronic obstructive pulmonary disease, unspecified: Secondary | ICD-10-CM | POA: Diagnosis not present

## 2020-11-09 DIAGNOSIS — C320 Malignant neoplasm of glottis: Secondary | ICD-10-CM | POA: Diagnosis not present

## 2020-11-09 DIAGNOSIS — R042 Hemoptysis: Secondary | ICD-10-CM

## 2020-11-09 DIAGNOSIS — J9602 Acute respiratory failure with hypercapnia: Secondary | ICD-10-CM

## 2020-11-09 DIAGNOSIS — G4734 Idiopathic sleep related nonobstructive alveolar hypoventilation: Secondary | ICD-10-CM

## 2020-11-09 MED ORDER — ARFORMOTEROL TARTRATE 15 MCG/2ML IN NEBU: 15.0000 ug | INHALATION_SOLUTION | Freq: Two times a day (BID) | RESPIRATORY_TRACT | 2 refills | Status: DC

## 2020-11-09 MED ORDER — BUDESONIDE 0.5 MG/2ML IN SUSP: 0.5000 mg | Freq: Two times a day (BID) | RESPIRATORY_TRACT | 2 refills | Status: DC

## 2020-11-09 MED ORDER — DEXTROMETHORPHAN HBR 15 MG/5ML PO SYRP: 10.0000 mL | ORAL_SOLUTION | Freq: Four times a day (QID) | ORAL | 0 refills | Status: DC | PRN

## 2020-11-09 MED ORDER — YUPELRI 175 MCG/3ML IN SOLN: 175.0000 ug | Freq: Every day | RESPIRATORY_TRACT | 0 refills | Status: DC

## 2020-11-09 NOTE — Patient Instructions (Addendum)
Pleasure meeting you today, glad you are feeling some better  We will send in prescription for nebulizer's to a mail in pharmacy that will hopefully cover your medication/ can process under medicare part B and not part D   Recommendations: - Continue Brovana twice a day (morning and evening) - Start Yupelri once daily (morning) - Take dextromethorphan 42ml every 6 hours for cough suppression (this is similar to delsym over the counter)   Orders: - Needs ONO on RA  - Requesting PFTs from [facility] medical  Follow-up: - Keep apt with DR. Dewald in January

## 2020-11-09 NOTE — Progress Notes (Signed)
@Patient  ID: Alexander Ramirez, male    DOB: 14-May-1951, 69 y.o.   MRN: [id number]  Chief Complaint  Patient presents with  . Hospitalization Follow-up    Pt was recently in the hospital 12/12-12/15 due to respiratory distress. Pt does have a stoma in place. Pt said he is getting better as each day goes on. Pt said that he will cough and get up some blood clots which is better than it was. Pt does have some SOB which is not new.    Referring provider: Colonel Bald, MD  HPI: 69 year old male.  Past medical history significant for hypertension, sleep apnea, laryngeal cancer status post laryngectomy in 2019, GERD, hemidiaphragm paralysis.    Patient admitted to the hospital from 10/29/2020-11/01/2020 for influenza, hemoptysis and acute respiratory failure with hypoxia.  He was diagnosed with influenza and started on Tamiflu on 10/25/2020.  In the emergency room he started coughing up blood clots.  Patient went into A. fib and started on IV metoprolol.  Admitted to ICU, PCCM consulted.  Patient underwent bronchoscopy with Dr. Lake Bells on 10/31/2020 which revealed severe, acute inflammation throughout lungs. Bronchial mucusa extremely abnormal throughout consistent with inflammation/acute bronchitis from influenza.  Suspected severe inflamed airways could also be chronic related to his tracheostomy stoma.  Improved with prednisone and bronchodilators.  He will need screening CT scans if not already performed as part of his head and neck cancer follow-up.  He needs oxygen with ambulation including home concentrator and humidifier.  Unable to get spirometry suspect COPD given smoking history. Discharged on Brovana 42mcg and Pulmicort 0.5mg  twice daily with as needed albuterol.  Needs follow-up appointment with otolaryngology.   11/09/2020- interim hx  Patient presents today for hospital follow-up. Reports that he is a doing better. Breathing has improved some since hospitalization. He received  Brovana and he has been using this twice a day. He did not get budesonide nebulizer as the cost was too expensive. He he has oxygen at home which he has only worn once at night. He is still experiencing occasional episode of hemoptysis but not as much as he was previously. Hemoptysis occurring mostly just once or twice a day. He is using a humidifier at home. He is not able to cough the dried mucus up, reports that he needs to use tweezers to get clots out.   He follow with Dr. Jamey Reas ENT oncology, last seen on 09/19/20. Radiation completed in August 2018. S/p laryngectomy and neck dissection in June 2019. Disease status, no evidence of disease. Recommended follow up in June 2022.   No Known Allergies   Immunization History  Administered Date(s) Administered  . Fluad Quad(high Dose 65+) 11/01/2020  . Moderna Sars-Covid-2 Vaccination 12/09/2019, 01/05/2020, 11/06/2020  . Pneumococcal Polysaccharide-23 05/26/2018  . Pneumococcal-Unspecified 09/26/2019    Past Medical History:  Diagnosis Date  . Arthritis   . Cancer (HCC)    throat cancer  . GERD (gastroesophageal reflux disease)    OTC meds  . Gout   . Hemidiaphragm paralysis   . History of radiation therapy 05/26/2017- 07/02/2017   Larynx 63 Gy in 28 fractions.   . Hypercholesteremia   . Hypertension   . Kidney stones   . Primary localized osteoarthritis of right knee 12/09/2017  . Sleep apnea    uses CPAP nightly    Tobacco History: Social History   Tobacco Use  Smoking Status Former Smoker  . Packs/day: 1.00  . Years: 30.00  . Pack years: 30.00  .  Types: Cigarettes  . Quit date: 79  . Years since quitting: 30.0  Smokeless Tobacco Never Used   Counseling given: Not Answered   Outpatient Medications Prior to Visit  Medication Sig Dispense Refill  . albuterol (PROVENTIL) (2.5 MG/3ML) 0.083% nebulizer solution Take 3 mLs (2.5 mg total) by nebulization every 6 (six) hours as needed for wheezing or shortness of breath.  75 mL 12  . allopurinol (ZYLOPRIM) 100 MG tablet Take 200 mg by mouth daily.    Marland Kitchen amLODipine (NORVASC) 5 MG tablet Take 1 tablet (5 mg total) by mouth daily. 30 tablet 2  . carvedilol (COREG) 25 MG tablet Take 25 mg by mouth 2 (two) times daily with a meal.    . folic acid (FOLVITE) 1 MG tablet Take 1 mg by mouth daily.    Marland Kitchen omeprazole (PRILOSEC OTC) 20 MG tablet Take 20 mg by mouth daily.    Marland Kitchen telmisartan (MICARDIS) 80 MG tablet Take 80 mg by mouth daily.    Marland Kitchen arformoterol (BROVANA) 15 MCG/2ML NEBU Take 2 mLs (15 mcg total) by nebulization 2 (two) times daily. 120 mL 2  . budesonide (PULMICORT) 0.5 MG/2ML nebulizer solution Take 2 mLs (0.5 mg total) by nebulization 2 (two) times daily. (Patient not taking: Reported on 11/09/2020) 120 mL 2   No facility-administered medications prior to visit.    Review of Systems  Review of Systems  Respiratory: Positive for cough. Negative for chest tightness, shortness of breath and wheezing.    Physical Exam  BP 130/80 (BP Location: Left Arm, Cuff Size: Normal)   Pulse 67   Ht 5\' 5"  (1.651 m)   Wt 219 lb 12.8 oz (99.7 kg)   SpO2 97%   BMI 36.58 kg/m  Physical Exam Constitutional:      Appearance: Normal appearance.  HENT:     Head: Normocephalic and atraumatic.     Mouth/Throat:     Comments: Trach stoma  Cardiovascular:     Rate and Rhythm: Normal rate and regular rhythm.  Pulmonary:     Effort: Pulmonary effort is normal.     Breath sounds: Normal breath sounds. No wheezing or rales.  Neurological:     General: No focal deficit present.     Mental Status: He is alert and oriented to person, place, and time. Mental status is at baseline.  Psychiatric:        Mood and Affect: Mood normal.        Behavior: Behavior normal.        Thought Content: Thought content normal.        Judgment: Judgment normal.      Lab Results:  CBC    Component Value Date/Time   WBC 14.5 (H) 11/01/2020 0207   RBC 4.75 11/01/2020 0207   HGB 14.5  11/01/2020 0207   HGB 14.9 10/31/2017 0926   HCT 44.4 11/01/2020 0207   HCT 45.7 10/31/2017 0926   PLT 216 11/01/2020 0207   PLT 145 10/31/2017 0926   MCV 93.5 11/01/2020 0207   MCV 93.5 10/31/2017 0926   MCH 30.5 11/01/2020 0207   MCHC 32.7 11/01/2020 0207   RDW 13.2 11/01/2020 0207   RDW 13.5 10/31/2017 0926   LYMPHSABS 0.7 11/01/2020 0207   LYMPHSABS 1.8 10/31/2017 0926   MONOABS 0.5 11/01/2020 0207   MONOABS 0.7 10/31/2017 0926   EOSABS 0.0 11/01/2020 0207   EOSABS 0.2 10/31/2017 0926   BASOSABS 0.0 11/01/2020 0207   BASOSABS 0.0 10/31/2017 3149  BMET    Component Value Date/Time   NA 141 11/01/2020 0207   NA 143 10/31/2017 0926   K 4.2 11/01/2020 0207   K 4.6 10/31/2017 0926   CL 105 11/01/2020 0207   CO2 25 11/01/2020 0207   CO2 23 10/31/2017 0926   GLUCOSE 155 (H) 11/01/2020 0207   GLUCOSE 80 10/31/2017 0926   BUN 27 (H) 11/01/2020 0207   BUN 25.4 10/31/2017 0926   CREATININE 0.76 11/01/2020 0207   CREATININE 0.8 10/31/2017 0926   CALCIUM 8.6 (L) 11/01/2020 0207   CALCIUM 9.4 10/31/2017 0926   GFRNONAA >60 11/01/2020 0207   GFRAA >60 12/10/2017 0849    BNP No results found for: BNP  ProBNP No results found for: PROBNP  Imaging: No results found.   Assessment & Plan:   COPD (chronic obstructive pulmonary disease) ([place]) - Unable to get spirometry, suspect COPD given smoking history - Patient has been using Brovana nebulizer twice a day; he was unable to pick up RX for budesonide d/t cost. Adding Yupelri nebulizer once a day  - We will send nebulizer prescriptions to [facility] online  - Requesting old PFTs from [place] hypercapnic respiratory failure ([place]) - Patient was discharge home on oxygen, he has not been requiring this. He walked 1 lap today at steady pace and did not desaturate. - Recommend getting ONO and if O2 sustaining >88% ok to discontinue oxygen   Malignant neoplasm of glottis ([place]) - Follows with Dr. Jamey Reas  ENT oncology, last seen on 09/19/20. Radiation completed in August 2018. S/p laryngectomy and neck dissection in June 2019. Disease status, no evidence of disease. Recommended follow up in June 2022. He will need screening CT scans if not already performed as part of his head and neck cancer follow-up  Hemoptysis - Patient underwent bronchoscopy with Dr. Lake Bells on 10/31/2020 which revealed severe, acute inflammation throughout lungs. Bronchial mucusa extremely abnormal throughout consistent with inflammation/acute bronchitis from influenza.  Suspected severe inflamed airways could also be chronic related to his tracheostomy stoma. - Hemoptysis has improved, mostly dried and occurring 1-2 times a day - Advised adding dextromethorphan 37ml every 6 hours for cough suppression  - Keep apt with Dr. Erin Fulling in January 2022   Martyn Ehrich, NP 12/11/2020

## 2020-11-28 DIAGNOSIS — R0683 Snoring: Secondary | ICD-10-CM | POA: Diagnosis not present

## 2020-11-28 DIAGNOSIS — G4730 Sleep apnea, unspecified: Secondary | ICD-10-CM | POA: Diagnosis not present

## 2020-12-01 ENCOUNTER — Encounter: Payer: Self-pay | Admitting: Pulmonary Disease

## 2020-12-01 ENCOUNTER — Other Ambulatory Visit: Payer: Self-pay

## 2020-12-01 ENCOUNTER — Ambulatory Visit (INDEPENDENT_AMBULATORY_CARE_PROVIDER_SITE_OTHER): Payer: PPO | Admitting: Pulmonary Disease

## 2020-12-01 VITALS — BP 128/86 | HR 77 | Temp 98.2°F | Ht 65.0 in | Wt 222.0 lb

## 2020-12-01 DIAGNOSIS — Z720 Tobacco use: Secondary | ICD-10-CM | POA: Diagnosis not present

## 2020-12-01 DIAGNOSIS — Z8701 Personal history of pneumonia (recurrent): Secondary | ICD-10-CM | POA: Diagnosis not present

## 2020-12-01 DIAGNOSIS — R042 Hemoptysis: Secondary | ICD-10-CM

## 2020-12-01 NOTE — Progress Notes (Signed)
Subjective:   PATIENT ID: Alexander Ramirez GENDER: male DOB: February 05, 1951, MRN: [id number]   HPI  Chief Complaint  Patient presents with  . Consult    F/U after seeing [name] on 11/09/20. Has not started on his nebulizer medication. Received a call last night stating that his medications would cost him over $900 with insurance.    [name] is a 70 year old male, former smoker with laryngeal cancer s/p laryngectomy in 2019, GERD and paralyzed hemidiaphragm who returns to pulmonary clinic for hospital follow up after developing acute hypoxemic respiratory failure and hemoptysis in the setting of influenza pneumonia.   He was hospitalized 10/29/20 to 11/01/20 and underwent bronchoscopy which was notable for friable bronchial mucosa throughout secondary tot he influenza infection. The hemoptysis has since resolved along with his shortness of breath. He reports he has stopped wearing supplemental oxygen at home. On ambulatory oxygen monitoring today he saturated 99% on room air at rest and 94% on room air at the end of 3 laps.   He has been prescribed budeonisde, brovana and yupelri nebulizer treatments at last follow up visit. These medications have been hard to obtain due to the cost. He has been using yupelri as he was provided samples of this. He has not noticed much of a difference in his breathing with these nebulizer treatments.  He was previously receiving low dose CT chest scans for lung cancer screening. Last CT chest scan was in 2019 at [facility]. No suspicious nodules noted at that time.   Past Medical History:  Diagnosis Date  . Arthritis   . Cancer (HCC)    throat cancer  . GERD (gastroesophageal reflux disease)    OTC meds  . Gout   . Hemidiaphragm paralysis   . History of radiation therapy 05/26/2017- 07/02/2017   Larynx 63 Gy in 28 fractions.   . Hypercholesteremia   . Hypertension   . Kidney stones   . Primary localized osteoarthritis of right knee 12/09/2017  . Sleep  apnea    uses CPAP nightly     Family History  Problem Relation Age of Onset  . Lung cancer Father        Passed at the age of 24     Social History   Socioeconomic History  . Marital status: Married    Spouse name: Not on file  . Number of children: Not on file  . Years of education: Not on file  . Highest education level: Not on file  Occupational History  . Not on file  Tobacco Use  . Smoking status: Former Smoker    Packs/day: 1.00    Years: 30.00    Pack years: 30.00    Types: Cigarettes    Quit date: 1992    Years since quitting: 30.0  . Smokeless tobacco: Never Used  Vaping Use  . Vaping Use: Never used  Substance and Sexual Activity  . Alcohol use: Yes    Comment: occ  . Drug use: No  . Sexual activity: Not on file  Other Topics Concern  . Not on file  Social History Narrative  . Not on file   Social Determinants of Health   Financial Resource Strain: Not on file  Food Insecurity: Not on file  Transportation Needs: Not on file  Physical Activity: Not on file  Stress: Not on file  Social Connections: Not on file  Intimate Partner Violence: Not on file     No Known Allergies  Outpatient Medications Prior to Visit  Medication Sig Dispense Refill  . albuterol (PROVENTIL) (2.5 MG/3ML) 0.083% nebulizer solution Take 3 mLs (2.5 mg total) by nebulization every 6 (six) hours as needed for wheezing or shortness of breath. 75 mL 12  . allopurinol (ZYLOPRIM) 100 MG tablet Take 200 mg by mouth daily.    Marland Kitchen amLODipine (NORVASC) 5 MG tablet Take 1 tablet (5 mg total) by mouth daily. 30 tablet 2  . arformoterol (BROVANA) 15 MCG/2ML NEBU Take 2 mLs (15 mcg total) by nebulization 2 (two) times daily. 120 mL 2  . budesonide (PULMICORT) 0.5 MG/2ML nebulizer solution Take 2 mLs (0.5 mg total) by nebulization 2 (two) times daily. 120 mL 2  . carvedilol (COREG) 25 MG tablet Take 25 mg by mouth 2 (two) times daily with a meal.    . dextromethorphan 15 MG/5ML syrup Take  10 mLs (30 mg total) by mouth 4 (four) times daily as needed for cough. 614 mL 0  . folic acid (FOLVITE) 1 MG tablet Take 1 mg by mouth daily.    Marland Kitchen omeprazole (PRILOSEC OTC) 20 MG tablet Take 20 mg by mouth daily.    . revefenacin (YUPELRI) 175 MCG/3ML nebulizer solution Take 3 mLs (175 mcg total) by nebulization daily. 7 mL 0  . telmisartan (MICARDIS) 80 MG tablet Take 80 mg by mouth daily.     No facility-administered medications prior to visit.    Review of Systems  Constitutional: Negative for chills, fever, malaise/fatigue and weight loss.  HENT: Negative for congestion, sinus pain and sore throat.   Eyes: Negative.   Respiratory: Positive for cough and sputum production. Negative for hemoptysis, shortness of breath and wheezing.   Cardiovascular: Negative for chest pain, palpitations, orthopnea, claudication, leg swelling and PND.  Gastrointestinal: Negative for abdominal pain, heartburn, nausea and vomiting.  Genitourinary: Negative.   Musculoskeletal: Negative.   Neurological: Negative.   Endo/Heme/Allergies: Negative.   Psychiatric/Behavioral: Negative.    Objective:   Vitals:   12/01/20 1502  BP: 128/86  Pulse: 77  Temp: 98.2 F (36.8 C)  TempSrc: Temporal  SpO2: 95%  Weight: 222 lb (100.7 kg)  Height: 5\' 5"  (1.651 m)     Physical Exam Constitutional:      General: He is not in acute distress.    Appearance: Normal appearance. He is obese. He is not ill-appearing.  HENT:     Head: Normocephalic and atraumatic.  Eyes:     Conjunctiva/sclera: Conjunctivae normal.  Neck:     Comments: Tracheostomy in place Cardiovascular:     Rate and Rhythm: Normal rate and regular rhythm.     Pulses: Normal pulses.     Heart sounds: Normal heart sounds. No murmur heard.   Pulmonary:     Effort: Pulmonary effort is normal.     Breath sounds: Normal breath sounds.  Abdominal:     General: Bowel sounds are normal.     Palpations: Abdomen is soft.  Musculoskeletal:      Right lower leg: No edema.     Left lower leg: No edema.  Skin:    General: Skin is warm and dry.  Neurological:     General: No focal deficit present.     Mental Status: He is alert.  Psychiatric:        Mood and Affect: Mood normal.        Behavior: Behavior normal.        Thought Content: Thought content normal.  Judgment: Judgment normal.     CBC    Component Value Date/Time   WBC 14.5 (H) 11/01/2020 0207   RBC 4.75 11/01/2020 0207   HGB 14.5 11/01/2020 0207   HGB 14.9 10/31/2017 0926   HCT 44.4 11/01/2020 0207   HCT 45.7 10/31/2017 0926   PLT 216 11/01/2020 0207   PLT 145 10/31/2017 0926   MCV 93.5 11/01/2020 0207   MCV 93.5 10/31/2017 0926   MCH 30.5 11/01/2020 0207   MCHC 32.7 11/01/2020 0207   RDW 13.2 11/01/2020 0207   RDW 13.5 10/31/2017 0926   LYMPHSABS 0.7 11/01/2020 0207   LYMPHSABS 1.8 10/31/2017 0926   MONOABS 0.5 11/01/2020 0207   MONOABS 0.7 10/31/2017 0926   EOSABS 0.0 11/01/2020 0207   EOSABS 0.2 10/31/2017 0926   BASOSABS 0.0 11/01/2020 0207   BASOSABS 0.0 10/31/2017 0926   BMP Latest Ref Rng & Units 11/01/2020 10/31/2020 10/30/2020  Glucose 70 - 99 mg/dL 155(H) 178(H) 144(H)  BUN 8 - 23 mg/dL 27(H) 28(H) 23  Creatinine 0.61 - 1.24 mg/dL 0.76 0.78 0.86  Sodium 135 - 145 mmol/L 141 141 141  Potassium 3.5 - 5.1 mmol/L 4.2 4.2 3.5  Chloride 98 - 111 mmol/L 105 105 102  CO2 22 - 32 mmol/L 25 24 25   Calcium 8.9 - 10.3 mg/dL 8.6(L) 8.8(L) 8.6(L)   Chest imaging: CXR 11/01/2020 Stable asymmetric elevation left hemidiaphragm. Collapse/consolidative opacity noted adjacent to the elevated diaphragm. Tiny left upper lobe pulmonary nodule, seen to be calcified granuloma on lung cancer screening CT 10/30/2017. Probable minimal atelectasis at the right base. The cardio pericardial silhouette is enlarged. There is pulmonary vascular congestion without overt pulmonary edema. Bones are diffusely demineralized  CT Chest  10/30/2017 Mediastinum/Nodes: No mediastinal lymphadenopathy. No evidence for gross hilar lymphadenopathy although assessment is limited by the lack of intravenous contrast on today's study. The esophagus has normal imaging features. There is no axillary lymphadenopathy.  Lungs/Pleura: Scattered tiny bilateral pulmonary nodules are again identified, many of which are densely calcified consistent with granulomata. No suspicious pulmonary nodule or mass. No focal airspace consolidation. No pulmonary edema or pleural effusion.  PFT: No flowsheet data found.    Assessment & Plan:   H/O influenza pneumonia  Hemoptysis  Tobacco use - Plan: Ambulatory Referral for Lung Cancer Scre  Discussion: Cambren Helm is a 70 year old male, former smoker with laryngeal cancer s/p laryngectomy in 2019, GERD and paralyzed hemidiaphragm who returns to pulmonary clinic for hospital follow up after developing acute hypoxemic respiratory failure and hemoptysis in the setting of influenza pneumonia.   He has had a nice recovery from his recent influenza pneumonia leading to respiratory failure and hemoptysis. He does not require supplemental oxygen based on ambulatory oxygen monitoring today. We are awaiting his overnight oximetry results and then will message his DME company with further orders.     He can stop budesonide, brovana and yupelri nebulizer treatments as his symptoms have resolved. If he needs bronchodilator therapy in the future then we can consider albuterol or duonebs as these will be more cost efficient with his insurance. For now he can use saline nebulizer treatments for bronchial hygiene and mucous clearance.   We will refer him to our lung cancer screening program to continue annual surveillance given his risk factors and smoking history.   Follow up in 2 months  Freda Jackson, MD [place] Office: [phone]   See Amion for Pager Details    Current  Outpatient Medications:  .  albuterol (PROVENTIL) (2.5 MG/3ML) 0.083% nebulizer solution, Take 3 mLs (2.5 mg total) by nebulization every 6 (six) hours as needed for wheezing or shortness of breath., Disp: 75 mL, Rfl: 12 .  allopurinol (ZYLOPRIM) 100 MG tablet, Take 200 mg by mouth daily., Disp: , Rfl:  .  amLODipine (NORVASC) 5 MG tablet, Take 1 tablet (5 mg total) by mouth daily., Disp: 30 tablet, Rfl: 2 .  arformoterol (BROVANA) 15 MCG/2ML NEBU, Take 2 mLs (15 mcg total) by nebulization 2 (two) times daily., Disp: 120 mL, Rfl: 2 .  budesonide (PULMICORT) 0.5 MG/2ML nebulizer solution, Take 2 mLs (0.5 mg total) by nebulization 2 (two) times daily., Disp: 120 mL, Rfl: 2 .  carvedilol (COREG) 25 MG tablet, Take 25 mg by mouth 2 (two) times daily with a meal., Disp: , Rfl:  .  dextromethorphan 15 MG/5ML syrup, Take 10 mLs (30 mg total) by mouth 4 (four) times daily as needed for cough., Disp: 240 mL, Rfl: 0 .  folic acid (FOLVITE) 1 MG tablet, Take 1 mg by mouth daily., Disp: , Rfl:  .  omeprazole (PRILOSEC OTC) 20 MG tablet, Take 20 mg by mouth daily., Disp: , Rfl:  .  revefenacin (YUPELRI) 175 MCG/3ML nebulizer solution, Take 3 mLs (175 mcg total) by nebulization daily., Disp: 7 mL, Rfl: 0 .  telmisartan (MICARDIS) 80 MG tablet, Take 80 mg by mouth daily., Disp: , Rfl:

## 2020-12-01 NOTE — Patient Instructions (Addendum)
Ok to stop using budesonide, brovana and yupelri nebulizer treatments.   If you notice worsening in your breathing after stopping these medications then I will send in prescription for albuterol or duonebs which should be better covered by your insurance  You can use the saline bullets in your nebulizer machine 2-3 times per day to provide moisture for your airways.  We will refer you to our lung cancer screening program.

## 2020-12-03 DIAGNOSIS — J111 Influenza due to unidentified influenza virus with other respiratory manifestations: Secondary | ICD-10-CM | POA: Diagnosis not present

## 2020-12-03 DIAGNOSIS — Z930 Tracheostomy status: Secondary | ICD-10-CM | POA: Diagnosis not present

## 2020-12-03 DIAGNOSIS — I480 Paroxysmal atrial fibrillation: Secondary | ICD-10-CM | POA: Diagnosis not present

## 2020-12-03 DIAGNOSIS — C320 Malignant neoplasm of glottis: Secondary | ICD-10-CM | POA: Diagnosis not present

## 2020-12-03 DIAGNOSIS — I10 Essential (primary) hypertension: Secondary | ICD-10-CM | POA: Diagnosis not present

## 2020-12-05 ENCOUNTER — Telehealth: Payer: Self-pay | Admitting: Primary Care

## 2020-12-05 DIAGNOSIS — G4734 Idiopathic sleep related nonobstructive alveolar hypoventilation: Secondary | ICD-10-CM

## 2020-12-05 NOTE — Telephone Encounter (Signed)
Thanks [employer].   Also please let his DME company know that he does not require oxygen during the day but he does require it at nighttime so he may only need a concentrator for nighttime use rather than having the rest of the O2 bottles and equipement.  Thanks, Wille Glaser

## 2020-12-05 NOTE — Telephone Encounter (Signed)
Please let patient know ONO on 11/28/20 showed that he spent 28 mins with O2 <88%. SpO2 low was 83% with basal at 92%. He should continue to wear 2L oxygen at bedtime.   Cc: Dr. Erin Fulling

## 2020-12-05 NOTE — Telephone Encounter (Signed)
Spoke with patient. He verbalized understanding. He stated that he was told to use the oxygen through his stoma and not his nose. He wants to know if any special precautions need to be taken with stoma.   Dr. Erin Fulling, can you please advise? Thanks!

## 2020-12-06 NOTE — Telephone Encounter (Signed)
He can be ordered a trach collar to have the oxygen administered through his stoma. I would recommend humidified oxygen given his secretions if the DME company can arrange for this.   Thanks, Wille Glaser

## 2020-12-06 NOTE — Telephone Encounter (Signed)
Spoke with patient. I described a trach collar to him and he stated that that does not sound like anything he has. I advised him that I would place an order today for a trach collar.   He also stated that the humidifier cup for his concentrator is broken and his concentrator is running louder than normal. The people in the apartment below him have complained. Will place an order for [employer] to evaluate his concentrator.   Nothing further needed at time of call.

## 2020-12-11 DIAGNOSIS — J449 Chronic obstructive pulmonary disease, unspecified: Secondary | ICD-10-CM | POA: Insufficient documentation

## 2020-12-11 NOTE — Assessment & Plan Note (Addendum)
-   Patient was discharge home on oxygen, he has not been requiring this. He walked 1 lap today at steady pace and did not desaturate. - Recommend getting ONO and if O2 sustaining >88% ok to discontinue oxygen

## 2020-12-11 NOTE — Assessment & Plan Note (Addendum)
-   Unable to get spirometry, suspect COPD given smoking history - Patient has been using Brovana nebulizer twice a day; he was unable to pick up RX for budesonide d/t cost. Adding Yupelri nebulizer once a day  - We will send nebulizer prescriptions to [facility] online  - Requesting old PFTs from [facility]

## 2020-12-11 NOTE — Assessment & Plan Note (Addendum)
-   Patient underwent bronchoscopy with Dr. Lake Bells on 10/31/2020 which revealed severe, acute inflammation throughout lungs. Bronchial mucusa extremely abnormal throughout consistent with inflammation/acute bronchitis from influenza.  Suspected severe inflamed airways could also be chronic related to his tracheostomy stoma. - Hemoptysis has improved, mostly dried and occurring 1-2 times a day - Advised adding dextromethorphan 68ml every 6 hours for cough suppression  - Keep apt with Dr. Erin Fulling in January 2022

## 2020-12-11 NOTE — Assessment & Plan Note (Addendum)
-   Follows with Dr. Jamey Reas ENT oncology, last seen on 09/19/20. Radiation completed in August 2018. S/p laryngectomy and neck dissection in June 2019. Disease status, no evidence of disease. Recommended follow up in June 2022. He will need screening CT scans if not already performed as part of his head and neck cancer follow-up

## 2020-12-13 ENCOUNTER — Telehealth: Payer: Self-pay | Admitting: Primary Care

## 2020-12-13 NOTE — Telephone Encounter (Signed)
Called and spoke with Lennette Bihari from [facility] who stated  That the brovana was denied under medicare part D but it is going to be covered under medicare part B.  Called and spoke with pt letting him know the info stated by [employer] and he verbalized understanding. Nothing further needed.

## 2020-12-13 NOTE — Telephone Encounter (Signed)
ATC Kevin x1, reached vm, left message to return call.

## 2020-12-13 NOTE — Telephone Encounter (Signed)
Alexander Ramirez is returning phone call. Alexander Ramirez phone number is [phone].

## 2020-12-20 ENCOUNTER — Telehealth: Payer: Self-pay | Admitting: Acute Care

## 2020-12-25 NOTE — Telephone Encounter (Signed)
Spoke to pt regarding lung cancer screening. Pt reports that he had a h/o vocal chord cancer with laryngectomy in 2019 with radiation tx . Pt was seen at [facility].  Per lung cancer screening guidelines pt will not qualify for lung cancer screening until he has been cancer free for 5 years. I advised pt that I will send this note to Dr Erin Fulling to see if there is anything else he would like to do.  Please advise.

## 2020-12-26 NOTE — Telephone Encounter (Signed)
Nothing further we can do if he does not qualify for screening.  Alexander Ramirez

## 2020-12-27 NOTE — Telephone Encounter (Signed)
Spoke with pt and advised of information from Dr Erin Fulling. Pt states he will be seeing his surgeon in June 2022 and will discuss any kind of screening needed at that time. Nothing further needed at this time.

## 2021-01-03 DIAGNOSIS — C320 Malignant neoplasm of glottis: Secondary | ICD-10-CM | POA: Diagnosis not present

## 2021-01-03 DIAGNOSIS — I10 Essential (primary) hypertension: Secondary | ICD-10-CM | POA: Diagnosis not present

## 2021-01-03 DIAGNOSIS — Z930 Tracheostomy status: Secondary | ICD-10-CM | POA: Diagnosis not present

## 2021-01-03 DIAGNOSIS — J111 Influenza due to unidentified influenza virus with other respiratory manifestations: Secondary | ICD-10-CM | POA: Diagnosis not present

## 2021-01-03 DIAGNOSIS — I480 Paroxysmal atrial fibrillation: Secondary | ICD-10-CM | POA: Diagnosis not present

## 2021-01-25 ENCOUNTER — Telehealth: Payer: Self-pay | Admitting: Pulmonary Disease

## 2021-01-25 DIAGNOSIS — G4734 Idiopathic sleep related nonobstructive alveolar hypoventilation: Secondary | ICD-10-CM

## 2021-01-25 NOTE — Telephone Encounter (Signed)
Called and spoke with pt. Pt said the last time he wore his O2 was 2 days after his last appt on 12/05/20.  Pt said that the compressor is too noisy and due to this, he is not able to get much sleep at night. Pt said that he even called DME and they brought him out a new machine which was just as noisy as the first one and they told him that it was a noisy machine.  Dr. Erin Fulling, please advise.

## 2021-01-26 NOTE — Telephone Encounter (Signed)
If patient isn't going to use the oxygen then we can place an order for the DME company to pick up their equipment.   His overnight oxygen study showed that he was under 88% oxygen saturations for 5-79minutes which qualified him for the oxygen. It would be best for him to be on it but if it is too loud and he is not using it then he nor his insurance does not need to be billed for it.  Thanks, Wille Glaser

## 2021-01-26 NOTE — Telephone Encounter (Signed)
LMTCB for the pt 

## 2021-01-26 NOTE — Telephone Encounter (Signed)
Spoke with the pt and notified of response per Dr Erin Fulling  He verbalized understanding  He does want [place] to go ahead and send the order to d/c o2  Order placed to [facility]

## 2021-02-05 ENCOUNTER — Other Ambulatory Visit: Payer: Self-pay

## 2021-02-05 ENCOUNTER — Ambulatory Visit (INDEPENDENT_AMBULATORY_CARE_PROVIDER_SITE_OTHER): Payer: PPO | Admitting: Pulmonary Disease

## 2021-02-05 ENCOUNTER — Encounter: Payer: Self-pay | Admitting: Pulmonary Disease

## 2021-02-05 VITALS — BP 118/72 | HR 70 | Temp 97.9°F | Ht 65.0 in | Wt 221.2 lb

## 2021-02-05 DIAGNOSIS — G4734 Idiopathic sleep related nonobstructive alveolar hypoventilation: Secondary | ICD-10-CM | POA: Diagnosis not present

## 2021-02-05 DIAGNOSIS — Z9889 Other specified postprocedural states: Secondary | ICD-10-CM | POA: Diagnosis not present

## 2021-02-05 NOTE — Progress Notes (Signed)
Subjective:   PATIENT ID: Alexander Ramirez GENDER: male DOB: 04/16/1951, MRN: [id number]   HPI  Chief Complaint  Patient presents with  . Follow-up    No changes with breathing. Doing well at the moment.   Alexander Ramirez is a 70 year old male, former smoker with laryngeal cancer s/p laryngectomy in 2019, GERD and paralyzed hemidiaphragm who returns to pulmonary clinic for hospital follow up after developing acute hypoxemic respiratory failure and hemoptysis in the setting of influenza pneumonia.   He has been doing well since last visit. He is using saline nebulizer treatments for pulmonary hygiene and mucous clearance. His breathing is doing well at this time and he has not had any further hemoptysis. He has follow up soon with ENT for his cancer surveillance.   His lung cancer screening CT was denied by his insurance.   He is not using nocturnal oxygen as the compressor was too loud.  OV 12/01/20: He was hospitalized 10/29/20 to 11/01/20 and underwent bronchoscopy which was notable for friable bronchial mucosa throughout secondary tot he influenza infection. The hemoptysis has since resolved along with his shortness of breath. He reports he has stopped wearing supplemental oxygen at home. On ambulatory oxygen monitoring today he saturated 99% on room air at rest and 94% on room air at the end of 3 laps.   He has been prescribed budeonisde, brovana and yupelri nebulizer treatments at last follow up visit. These medications have been hard to obtain due to the cost. He has been using yupelri as he was provided samples of this. He has not noticed much of a difference in his breathing with these nebulizer treatments.  He was previously receiving low dose CT chest scans for lung cancer screening. Last CT chest scan was in 2019 at [facility]. No suspicious nodules noted at that time.    Past Medical History:  Diagnosis Date  . Arthritis   . Cancer (HCC)    throat cancer  . GERD  (gastroesophageal reflux disease)    OTC meds  . Gout   . Hemidiaphragm paralysis   . History of radiation therapy 05/26/2017- 07/02/2017   Larynx 63 Gy in 28 fractions.   . Hypercholesteremia   . Hypertension   . Kidney stones   . Primary localized osteoarthritis of right knee 12/09/2017  . Sleep apnea    uses CPAP nightly     Family History  Problem Relation Age of Onset  . Lung cancer Father        Passed at the age of 40     Social History   Socioeconomic History  . Marital status: Married    Spouse name: Not on file  . Number of children: Not on file  . Years of education: Not on file  . Highest education level: Not on file  Occupational History  . Not on file  Tobacco Use  . Smoking status: Former Smoker    Packs/day: 1.00    Years: 30.00    Pack years: 30.00    Types: Cigarettes    Quit date: 1992    Years since quitting: 30.2  . Smokeless tobacco: Never Used  Vaping Use  . Vaping Use: Never used  Substance and Sexual Activity  . Alcohol use: Yes    Comment: occ  . Drug use: No  . Sexual activity: Not on file  Other Topics Concern  . Not on file  Social History Narrative  . Not on file   Social  Determinants of Health   Financial Resource Strain: Not on file  Food Insecurity: Not on file  Transportation Needs: Not on file  Physical Activity: Not on file  Stress: Not on file  Social Connections: Not on file  Intimate Partner Violence: Not on file     No Known Allergies   Outpatient Medications Prior to Visit  Medication Sig Dispense Refill  . albuterol (PROVENTIL) (2.5 MG/3ML) 0.083% nebulizer solution Take 3 mLs (2.5 mg total) by nebulization every 6 (six) hours as needed for wheezing or shortness of breath. 75 mL 12  . allopurinol (ZYLOPRIM) 100 MG tablet Take 200 mg by mouth daily.    Marland Kitchen amLODipine (NORVASC) 5 MG tablet Take 1 tablet (5 mg total) by mouth daily. 30 tablet 2  . arformoterol (BROVANA) 15 MCG/2ML NEBU Take 2 mLs (15 mcg total) by  nebulization 2 (two) times daily. 120 mL 2  . budesonide (PULMICORT) 0.5 MG/2ML nebulizer solution Take 2 mLs (0.5 mg total) by nebulization 2 (two) times daily. 120 mL 2  . carvedilol (COREG) 25 MG tablet Take 25 mg by mouth 2 (two) times daily with a meal.    . folic acid (FOLVITE) 1 MG tablet Take 1 mg by mouth daily.    Marland Kitchen omeprazole (PRILOSEC OTC) 20 MG tablet Take 20 mg by mouth daily.    . revefenacin (YUPELRI) 175 MCG/3ML nebulizer solution Take 3 mLs (175 mcg total) by nebulization daily. 7 mL 0  . telmisartan (MICARDIS) 80 MG tablet Take 80 mg by mouth daily.    Marland Kitchen dextromethorphan 15 MG/5ML syrup Take 10 mLs (30 mg total) by mouth 4 (four) times daily as needed for cough. (Patient not taking: Reported on 02/05/2021) 240 mL 0   No facility-administered medications prior to visit.    Review of Systems  Constitutional: Negative for chills, fever, malaise/fatigue and weight loss.  HENT: Negative for congestion, sinus pain and sore throat.   Eyes: Negative.   Respiratory: Positive for cough and sputum production. Negative for hemoptysis, shortness of breath and wheezing.   Cardiovascular: Negative for chest pain, palpitations, orthopnea, claudication, leg swelling and PND.  Gastrointestinal: Negative for abdominal pain, heartburn, nausea and vomiting.  Genitourinary: Negative.   Musculoskeletal: Negative.   Neurological: Negative.   Endo/Heme/Allergies: Negative.   Psychiatric/Behavioral: Negative.    Objective:   Vitals:   02/05/21 0949  BP: 118/72  Pulse: 70  Temp: 97.9 F (36.6 C)  TempSrc: Oral  SpO2: 97%  Weight: 221 lb 3.2 oz (100.3 kg)  Height: 5\' 5"  (1.651 m)     Physical Exam Constitutional:      General: He is not in acute distress.    Appearance: Normal appearance. He is obese. He is not ill-appearing.  HENT:     Head: Normocephalic and atraumatic.  Eyes:     Conjunctiva/sclera: Conjunctivae normal.  Neck:     Comments: Tracheostomy in  place Cardiovascular:     Rate and Rhythm: Normal rate and regular rhythm.     Pulses: Normal pulses.     Heart sounds: Normal heart sounds. No murmur heard.   Pulmonary:     Effort: Pulmonary effort is normal.     Breath sounds: Decreased air movement present.  Abdominal:     General: Bowel sounds are normal.     Palpations: Abdomen is soft.  Musculoskeletal:     Right lower leg: No edema.     Left lower leg: No edema.  Skin:    General: Skin  is warm and dry.  Neurological:     General: No focal deficit present.     Mental Status: He is alert.  Psychiatric:        Mood and Affect: Mood normal.        Behavior: Behavior normal.        Thought Content: Thought content normal.        Judgment: Judgment normal.     CBC    Component Value Date/Time   WBC 14.5 (H) 11/01/2020 0207   RBC 4.75 11/01/2020 0207   HGB 14.5 11/01/2020 0207   HGB 14.9 10/31/2017 0926   HCT 44.4 11/01/2020 0207   HCT 45.7 10/31/2017 0926   PLT 216 11/01/2020 0207   PLT 145 10/31/2017 0926   MCV 93.5 11/01/2020 0207   MCV 93.5 10/31/2017 0926   MCH 30.5 11/01/2020 0207   MCHC 32.7 11/01/2020 0207   RDW 13.2 11/01/2020 0207   RDW 13.5 10/31/2017 0926   LYMPHSABS 0.7 11/01/2020 0207   LYMPHSABS 1.8 10/31/2017 0926   MONOABS 0.5 11/01/2020 0207   MONOABS 0.7 10/31/2017 0926   EOSABS 0.0 11/01/2020 0207   EOSABS 0.2 10/31/2017 0926   BASOSABS 0.0 11/01/2020 0207   BASOSABS 0.0 10/31/2017 0926   BMP Latest Ref Rng & Units 11/01/2020 10/31/2020 10/30/2020  Glucose 70 - 99 mg/dL 155(H) 178(H) 144(H)  BUN 8 - 23 mg/dL 27(H) 28(H) 23  Creatinine 0.61 - 1.24 mg/dL 0.76 0.78 0.86  Sodium 135 - 145 mmol/L 141 141 141  Potassium 3.5 - 5.1 mmol/L 4.2 4.2 3.5  Chloride 98 - 111 mmol/L 105 105 102  CO2 22 - 32 mmol/L 25 24 25   Calcium 8.9 - 10.3 mg/dL 8.6(L) 8.8(L) 8.6(L)   Chest imaging: CXR 11/01/2020 Stable asymmetric elevation left hemidiaphragm. Collapse/consolidative opacity noted adjacent  to the elevated diaphragm. Tiny left upper lobe pulmonary nodule, seen to be calcified granuloma on lung cancer screening CT 10/30/2017. Probable minimal atelectasis at the right base. The cardio pericardial silhouette is enlarged. There is pulmonary vascular congestion without overt pulmonary edema. Bones are diffusely demineralized  CT Chest 10/30/2017 Mediastinum/Nodes: No mediastinal lymphadenopathy. No evidence for gross hilar lymphadenopathy although assessment is limited by the lack of intravenous contrast on today's study. The esophagus has normal imaging features. There is no axillary lymphadenopathy.  Lungs/Pleura: Scattered tiny bilateral pulmonary nodules are again identified, many of which are densely calcified consistent with granulomata. No suspicious pulmonary nodule or mass. No focal airspace consolidation. No pulmonary edema or pleural effusion.  PFT: No flowsheet data found.    Assessment & Plan:   Nocturnal hypoxia  H/O tracheostomy  Discussion: Alexander Ramirez is a 70 year old male, former smoker with laryngeal cancer s/p laryngectomy in 2019, GERD and paralyzed hemidiaphragm who returns to pulmonary clinic for hospital follow up after developing acute hypoxemic respiratory failure and hemoptysis in the setting of influenza pneumonia.   He continues to do well since his hospitalization. He is to continue saline nebulizer treatments for pulmonary hygiene and mucous clearance.   His overnight oximitry showed 5-6 minutes where he was under 88% SpO2. He has not tolerated the noise of the oxygen concentrator so he has stopped using it.   He will discuss further lung cancer screening with his surgery team at [facility] as the CT chest lung cancer test we ordered was denied by insurance.    He is to follow up in 6 months.  Freda Jackson, MD [facility] Office:  [phone]    Current Outpatient Medications:  .  albuterol (PROVENTIL) (2.5  MG/3ML) 0.083% nebulizer solution, Take 3 mLs (2.5 mg total) by nebulization every 6 (six) hours as needed for wheezing or shortness of breath., Disp: 75 mL, Rfl: 12 .  allopurinol (ZYLOPRIM) 100 MG tablet, Take 200 mg by mouth daily., Disp: , Rfl:  .  amLODipine (NORVASC) 5 MG tablet, Take 1 tablet (5 mg total) by mouth daily., Disp: 30 tablet, Rfl: 2 .  arformoterol (BROVANA) 15 MCG/2ML NEBU, Take 2 mLs (15 mcg total) by nebulization 2 (two) times daily., Disp: 120 mL, Rfl: 2 .  budesonide (PULMICORT) 0.5 MG/2ML nebulizer solution, Take 2 mLs (0.5 mg total) by nebulization 2 (two) times daily., Disp: 120 mL, Rfl: 2 .  carvedilol (COREG) 25 MG tablet, Take 25 mg by mouth 2 (two) times daily with a meal., Disp: , Rfl:  .  folic acid (FOLVITE) 1 MG tablet, Take 1 mg by mouth daily., Disp: , Rfl:  .  omeprazole (PRILOSEC OTC) 20 MG tablet, Take 20 mg by mouth daily., Disp: , Rfl:  .  revefenacin (YUPELRI) 175 MCG/3ML nebulizer solution, Take 3 mLs (175 mcg total) by nebulization daily., Disp: 7 mL, Rfl: 0 .  telmisartan (MICARDIS) 80 MG tablet, Take 80 mg by mouth daily., Disp: , Rfl:  .  dextromethorphan 15 MG/5ML syrup, Take 10 mLs (30 mg total) by mouth 4 (four) times daily as needed for cough. (Patient not taking: Reported on 02/05/2021), Disp: 240 mL, Rfl: 0

## 2021-02-05 NOTE — Patient Instructions (Addendum)
Continue saline nebulizer treatments as needed for mucous clearance  Please call us if any symptoms change  Primary Care Doctor: Chester Hill at [facility]. [phone]

## 2021-02-08 DIAGNOSIS — Z430 Encounter for attention to tracheostomy: Secondary | ICD-10-CM | POA: Diagnosis not present

## 2021-04-09 DIAGNOSIS — Z430 Encounter for attention to tracheostomy: Secondary | ICD-10-CM | POA: Diagnosis not present

## 2021-05-01 DIAGNOSIS — K219 Gastro-esophageal reflux disease without esophagitis: Secondary | ICD-10-CM | POA: Diagnosis not present

## 2021-05-01 DIAGNOSIS — Z79899 Other long term (current) drug therapy: Secondary | ICD-10-CM | POA: Diagnosis not present

## 2021-05-01 DIAGNOSIS — C320 Malignant neoplasm of glottis: Secondary | ICD-10-CM | POA: Diagnosis not present

## 2021-05-01 DIAGNOSIS — R0602 Shortness of breath: Secondary | ICD-10-CM | POA: Diagnosis not present

## 2021-05-01 DIAGNOSIS — M1990 Unspecified osteoarthritis, unspecified site: Secondary | ICD-10-CM | POA: Diagnosis not present

## 2021-05-01 DIAGNOSIS — R5383 Other fatigue: Secondary | ICD-10-CM | POA: Diagnosis not present

## 2021-05-01 DIAGNOSIS — Z87442 Personal history of urinary calculi: Secondary | ICD-10-CM | POA: Diagnosis not present

## 2021-05-01 DIAGNOSIS — J9811 Atelectasis: Secondary | ICD-10-CM | POA: Diagnosis not present

## 2021-05-01 DIAGNOSIS — I10 Essential (primary) hypertension: Secondary | ICD-10-CM | POA: Diagnosis not present

## 2021-05-01 DIAGNOSIS — Z7951 Long term (current) use of inhaled steroids: Secondary | ICD-10-CM | POA: Diagnosis not present

## 2021-05-01 DIAGNOSIS — Z87891 Personal history of nicotine dependence: Secondary | ICD-10-CM | POA: Diagnosis not present

## 2021-05-01 DIAGNOSIS — Z9002 Acquired absence of larynx: Secondary | ICD-10-CM | POA: Diagnosis not present

## 2021-05-01 DIAGNOSIS — R9389 Abnormal findings on diagnostic imaging of other specified body structures: Secondary | ICD-10-CM | POA: Diagnosis not present

## 2021-05-24 DIAGNOSIS — Z430 Encounter for attention to tracheostomy: Secondary | ICD-10-CM | POA: Diagnosis not present

## 2021-07-02 DIAGNOSIS — E559 Vitamin D deficiency, unspecified: Secondary | ICD-10-CM | POA: Diagnosis not present

## 2021-07-02 DIAGNOSIS — D539 Nutritional anemia, unspecified: Secondary | ICD-10-CM | POA: Diagnosis not present

## 2021-07-02 DIAGNOSIS — E7800 Pure hypercholesterolemia, unspecified: Secondary | ICD-10-CM | POA: Diagnosis not present

## 2021-07-02 DIAGNOSIS — E790 Hyperuricemia without signs of inflammatory arthritis and tophaceous disease: Secondary | ICD-10-CM | POA: Diagnosis not present

## 2021-07-02 DIAGNOSIS — R5383 Other fatigue: Secondary | ICD-10-CM | POA: Diagnosis not present

## 2021-07-02 DIAGNOSIS — Z0000 Encounter for general adult medical examination without abnormal findings: Secondary | ICD-10-CM | POA: Diagnosis not present

## 2021-07-31 DIAGNOSIS — Z8521 Personal history of malignant neoplasm of larynx: Secondary | ICD-10-CM | POA: Diagnosis not present

## 2021-07-31 DIAGNOSIS — Z448 Encounter for fitting and adjustment of other external prosthetic devices: Secondary | ICD-10-CM | POA: Diagnosis not present

## 2021-07-31 DIAGNOSIS — Z9002 Acquired absence of larynx: Secondary | ICD-10-CM | POA: Diagnosis not present

## 2021-10-01 DIAGNOSIS — E7800 Pure hypercholesterolemia, unspecified: Secondary | ICD-10-CM | POA: Diagnosis not present

## 2021-10-01 DIAGNOSIS — E790 Hyperuricemia without signs of inflammatory arthritis and tophaceous disease: Secondary | ICD-10-CM | POA: Diagnosis not present

## 2021-10-01 DIAGNOSIS — Z79899 Other long term (current) drug therapy: Secondary | ICD-10-CM | POA: Diagnosis not present

## 2021-10-01 DIAGNOSIS — D539 Nutritional anemia, unspecified: Secondary | ICD-10-CM | POA: Diagnosis not present

## 2021-10-01 DIAGNOSIS — E559 Vitamin D deficiency, unspecified: Secondary | ICD-10-CM | POA: Diagnosis not present

## 2021-10-10 DIAGNOSIS — Z430 Encounter for attention to tracheostomy: Secondary | ICD-10-CM | POA: Diagnosis not present

## 2021-10-18 ENCOUNTER — Other Ambulatory Visit: Payer: Self-pay

## 2021-10-18 ENCOUNTER — Telehealth: Payer: Self-pay | Admitting: Pulmonary Disease

## 2021-10-18 DIAGNOSIS — J449 Chronic obstructive pulmonary disease, unspecified: Secondary | ICD-10-CM

## 2021-10-18 MED ORDER — AMOXICILLIN-POT CLAVULANATE 875-125 MG PO TABS: 1.0000 | ORAL_TABLET | Freq: Two times a day (BID) | ORAL | 0 refills | Status: AC

## 2021-10-18 NOTE — Telephone Encounter (Signed)
Called and spoke to pt. Pt c/o prod cough with thick green mucus x 2-3 days. Pt also c/o sore throat. Pt denies any other s/s. Pt states he got the flu shot 2 weeks ago. Pt states he hasnt been using his saline nebulizer but will start. Pt needs new neb tubing, order placed for this. Pt has upcoming appt on 10/23/21 with Dr. Erin Fulling. Pt currently out of town and will return home sometime tomorrow.   Dr. Erin Fulling, please advise on recs while pt waits for appt. Thank you!

## 2021-10-18 NOTE — Telephone Encounter (Signed)
P calling- states he has some "unusual looking phlegm coming up when he coughs"-Very thick and green. Pt has a laryngectomy- so phlegm comes out stoma.Pt thinks he needs an abx- states he's out of town and on his way back so he could come in tomorrow. Please advise [phone]

## 2021-10-18 NOTE — Telephone Encounter (Signed)
Patient checking on antiobotic. Patient phone number is [phone].

## 2021-10-18 NOTE — Telephone Encounter (Signed)
Called and spoke to patient. Let him know about Dr. August Albino rec. Augmentin sent in to [facility] on [address]. Nothing further needed.

## 2021-10-23 ENCOUNTER — Ambulatory Visit (INDEPENDENT_AMBULATORY_CARE_PROVIDER_SITE_OTHER): Payer: PPO | Admitting: Pulmonary Disease

## 2021-10-23 ENCOUNTER — Other Ambulatory Visit: Payer: Self-pay

## 2021-10-23 ENCOUNTER — Encounter: Payer: Self-pay | Admitting: Pulmonary Disease

## 2021-10-23 VITALS — BP 128/80 | HR 70 | Ht 65.0 in | Wt 218.0 lb

## 2021-10-23 DIAGNOSIS — Z9889 Other specified postprocedural states: Secondary | ICD-10-CM | POA: Diagnosis not present

## 2021-10-23 DIAGNOSIS — G4734 Idiopathic sleep related nonobstructive alveolar hypoventilation: Secondary | ICD-10-CM

## 2021-10-23 DIAGNOSIS — J0410 Acute tracheitis without obstruction: Secondary | ICD-10-CM | POA: Diagnosis not present

## 2021-10-23 MED ORDER — IPRATROPIUM-ALBUTEROL 0.5-2.5 (3) MG/3ML IN SOLN: RESPIRATORY_TRACT | 1 refills | Status: DC

## 2021-10-23 NOTE — Patient Instructions (Signed)
Start duoneb nebulizer treatment every 4-6 hours as needed  Use saline nebulizer treatment 1-2 times over the next week for mucous clearance  Recommend obtaining CT Chest scan with your ENT follow up at [facility] our office if the secretions increase after completion of your antibiotic.

## 2021-10-23 NOTE — Progress Notes (Signed)
Subjective:   PATIENT ID: Kingsley Spittle GENDER: male DOB: 04/03/1951, MRN: [id number]   HPI  Chief Complaint  Patient presents with   Follow-up    F/U after starting abx last week. States the cough has gotten better. Denies any increased SOB.    Elwood Bazinet is a 70 year old male, former smoker with laryngeal cancer s/p laryngectomy in 2019, GERD, paralyzed hemidiaphragm and pulmonary nodules who returns to pulmonary clinic for follow up.   He was started on augmentin on 12/1 for concern of tracheitis as he was having thick secretions and shortness of breath. He reports his symptoms are much improved since starting the antibiotic and using saline nebulizer treatments. He feels he has more shortness of breath in the Winter time.  He has follow up with ENT at [facility] in the near future.   OV 02/05/21 He has been doing well since last visit. He is using saline nebulizer treatments for pulmonary hygiene and mucous clearance. His breathing is doing well at this time and he has not had any further hemoptysis. He has follow up soon with ENT for his cancer surveillance.   His lung cancer screening CT was denied by his insurance.   He is not using nocturnal oxygen as the compressor was too loud.  OV 12/01/20: He was hospitalized 10/29/20 to 11/01/20 and underwent bronchoscopy which was notable for friable bronchial mucosa throughout secondary tot he influenza infection. The hemoptysis has since resolved along with his shortness of breath. He reports he has stopped wearing supplemental oxygen at home. On ambulatory oxygen monitoring today he saturated 99% on room air at rest and 94% on room air at the end of 3 laps.   He has been prescribed budeonisde, brovana and yupelri nebulizer treatments at last follow up visit. These medications have been hard to obtain due to the cost. He has been using yupelri as he was provided samples of this. He has not noticed much of a difference in his breathing  with these nebulizer treatments.  He was previously receiving low dose CT chest scans for lung cancer screening. Last CT chest scan was in 2019 at [facility]. No suspicious nodules noted at that time.    Past Medical History:  Diagnosis Date   Arthritis    Cancer ([place])    throat cancer   GERD (gastroesophageal reflux disease)    OTC meds   Gout    Hemidiaphragm paralysis    History of radiation therapy 05/26/2017- 07/02/2017   Larynx 63 Gy in 28 fractions.    Hypercholesteremia    Hypertension    Kidney stones    Primary localized osteoarthritis of right knee 12/09/2017   Sleep apnea    uses CPAP nightly     Family History  Problem Relation Age of Onset   Lung cancer Father        Passed at the age of 35     Social History   Socioeconomic History   Marital status: Married    Spouse name: Not on file   Number of children: Not on file   Years of education: Not on file   Highest education level: Not on file  Occupational History   Not on file  Tobacco Use   Smoking status: Former    Packs/day: 1.00    Years: 30.00    Pack years: 30.00    Types: Cigarettes    Quit date: 68    Years since quitting: 30.9   Smokeless  tobacco: Never  Vaping Use   Vaping Use: Never used  Substance and Sexual Activity   Alcohol use: Yes    Comment: occ   Drug use: No   Sexual activity: Not on file  Other Topics Concern   Not on file  Social History Narrative   Not on file   Social Determinants of Health   Financial Resource Strain: Not on file  Food Insecurity: Not on file  Transportation Needs: Not on file  Physical Activity: Not on file  Stress: Not on file  Social Connections: Not on file  Intimate Partner Violence: Not on file     No Known Allergies   Outpatient Medications Prior to Visit  Medication Sig Dispense Refill   allopurinol (ZYLOPRIM) 100 MG tablet Take 200 mg by mouth daily.     amoxicillin-clavulanate (AUGMENTIN) 875-125 MG tablet Take 1 tablet by mouth 2 (two)  times daily for 7 days. 14 tablet 0   carvedilol (COREG) 25 MG tablet Take 25 mg by mouth 2 (two) times daily with a meal.     omeprazole (PRILOSEC OTC) 20 MG tablet Take 20 mg by mouth daily.     telmisartan (MICARDIS) 80 MG tablet Take 80 mg by mouth daily.     Vitamin D, Ergocalciferol, (DRISDOL) 1.25 MG (50000 UNIT) CAPS capsule Take 50,000 Units by mouth once a week.     albuterol (PROVENTIL) (2.5 MG/3ML) 0.083% nebulizer solution Take 3 mLs (2.5 mg total) by nebulization every 6 (six) hours as needed for wheezing or shortness of breath. 75 mL 12   amLODipine (NORVASC) 5 MG tablet Take 1 tablet (5 mg total) by mouth daily. 30 tablet 2   arformoterol (BROVANA) 15 MCG/2ML NEBU Take 2 mLs (15 mcg total) by nebulization 2 (two) times daily. 120 mL 2   budesonide (PULMICORT) 0.5 MG/2ML nebulizer solution Take 2 mLs (0.5 mg total) by nebulization 2 (two) times daily. 120 mL 2   dextromethorphan 15 MG/5ML syrup Take 10 mLs (30 mg total) by mouth 4 (four) times daily as needed for cough. (Patient not taking: Reported on 1/44/3154) 008 mL 0   folic acid (FOLVITE) 1 MG tablet Take 1 mg by mouth daily.     revefenacin (YUPELRI) 175 MCG/3ML nebulizer solution Take 3 mLs (175 mcg total) by nebulization daily. 7 mL 0   No facility-administered medications prior to visit.    Review of Systems  Constitutional:  Negative for chills, fever, malaise/fatigue and weight loss.  HENT:  Negative for congestion, sinus pain and sore throat.   Eyes: Negative.   Respiratory:  Positive for cough and sputum production. Negative for hemoptysis, shortness of breath and wheezing.   Cardiovascular:  Negative for chest pain, palpitations, orthopnea, claudication, leg swelling and PND.  Gastrointestinal:  Negative for abdominal pain, heartburn, nausea and vomiting.  Genitourinary: Negative.   Musculoskeletal: Negative.   Neurological: Negative.   Endo/Heme/Allergies: Negative.   Psychiatric/Behavioral: Negative.      Objective:   Vitals:   10/23/21 1501  BP: 128/80  Pulse: 70  SpO2: 97%  Weight: 218 lb (98.9 kg)  Height: 5\' 5"  (1.651 m)   Physical Exam Constitutional:      General: He is not in acute distress.    Appearance: Normal appearance. He is obese. He is not ill-appearing.  HENT:     Head: Normocephalic and atraumatic.  Eyes:     Conjunctiva/sclera: Conjunctivae normal.  Neck:     Comments: Tracheostomy in place Cardiovascular:  Rate and Rhythm: Normal rate and regular rhythm.     Pulses: Normal pulses.     Heart sounds: Normal heart sounds. No murmur heard. Pulmonary:     Effort: Pulmonary effort is normal.     Breath sounds: Decreased air movement present.  Abdominal:     General: Bowel sounds are normal.     Palpations: Abdomen is soft.  Musculoskeletal:     Right lower leg: No edema.     Left lower leg: No edema.  Skin:    General: Skin is warm and dry.  Neurological:     General: No focal deficit present.     Mental Status: He is alert.  Psychiatric:        Mood and Affect: Mood normal.        Behavior: Behavior normal.        Thought Content: Thought content normal.        Judgment: Judgment normal.    CBC    Component Value Date/Time   WBC 14.5 (H) 11/01/2020 0207   RBC 4.75 11/01/2020 0207   HGB 14.5 11/01/2020 0207   HGB 14.9 10/31/2017 0926   HCT 44.4 11/01/2020 0207   HCT 45.7 10/31/2017 0926   PLT 216 11/01/2020 0207   PLT 145 10/31/2017 0926   MCV 93.5 11/01/2020 0207   MCV 93.5 10/31/2017 0926   MCH 30.5 11/01/2020 0207   MCHC 32.7 11/01/2020 0207   RDW 13.2 11/01/2020 0207   RDW 13.5 10/31/2017 0926   LYMPHSABS 0.7 11/01/2020 0207   LYMPHSABS 1.8 10/31/2017 0926   MONOABS 0.5 11/01/2020 0207   MONOABS 0.7 10/31/2017 0926   EOSABS 0.0 11/01/2020 0207   EOSABS 0.2 10/31/2017 0926   BASOSABS 0.0 11/01/2020 0207   BASOSABS 0.0 10/31/2017 0926   BMP Latest Ref Rng & Units 11/01/2020 10/31/2020 10/30/2020  Glucose 70 - 99 mg/dL 155(H)  178(H) 144(H)  BUN 8 - 23 mg/dL 27(H) 28(H) 23  Creatinine 0.61 - 1.24 mg/dL 0.76 0.78 0.86  Sodium 135 - 145 mmol/L 141 141 141  Potassium 3.5 - 5.1 mmol/L 4.2 4.2 3.5  Chloride 98 - 111 mmol/L 105 105 102  CO2 22 - 32 mmol/L 25 24 25   Calcium 8.9 - 10.3 mg/dL 8.6(L) 8.8(L) 8.6(L)   Chest imaging: CXR 11/01/2020 Stable asymmetric elevation left hemidiaphragm. Collapse/consolidative opacity noted adjacent to the elevated diaphragm. Tiny left upper lobe pulmonary nodule, seen to be calcified granuloma on lung cancer screening CT 10/30/2017. Probable minimal atelectasis at the right base. The cardio pericardial silhouette is enlarged. There is pulmonary vascular congestion without overt pulmonary edema. Bones are diffusely demineralized  CT Chest 10/30/2017 Mediastinum/Nodes: No mediastinal lymphadenopathy. No evidence for gross hilar lymphadenopathy although assessment is limited by the lack of intravenous contrast on today's study. The esophagus has normal imaging features. There is no axillary lymphadenopathy.   Lungs/Pleura: Scattered tiny bilateral pulmonary nodules are again identified, many of which are densely calcified consistent with granulomata. No suspicious pulmonary nodule or mass. No focal airspace consolidation. No pulmonary edema or pleural effusion.  PFT: No flowsheet data found.    Assessment & Plan:   Tracheitis  Nocturnal hypoxia  H/O tracheostomy  Discussion: Khalil Szczepanik is a 70 year old male, former smoker with laryngeal cancer s/p laryngectomy in 2019, GERD, paralyzed hemidiaphragm and pulmonary nodules who returns to pulmonary clinic for follow up.   Patient is currently completing course of Augmentin for tracheitis with improvement in his symptoms. Encouraged patient to use  saline nebulizer treatments for further airway clearance.   We will send in prescription for duoneb nebulizer treatments given his shortness of breath and suspicion for  COPD. We will also obtain records from [facility] where he has completed pulmonary function tests in the past.   I have recommended that he obtain CT Chest with his CT neck follow up for laryngeal cancer to follow up previously noted pulmonary nodules.   He is to follow up in 6 months.  Freda Jackson, MD [facility] Office: [phone]   Current Outpatient Medications:    allopurinol (ZYLOPRIM) 100 MG tablet, Take 200 mg by mouth daily., Disp: , Rfl:    amoxicillin-clavulanate (AUGMENTIN) 875-125 MG tablet, Take 1 tablet by mouth 2 (two) times daily for 7 days., Disp: 14 tablet, Rfl: 0   carvedilol (COREG) 25 MG tablet, Take 25 mg by mouth 2 (two) times daily with a meal., Disp: , Rfl:    ipratropium-albuterol (DUONEB) 0.5-2.5 (3) MG/3ML SOLN, Take 49mL by nebulization every 4-6 hours as needed., Disp: 360 mL, Rfl: 1   omeprazole (PRILOSEC OTC) 20 MG tablet, Take 20 mg by mouth daily., Disp: , Rfl:    telmisartan (MICARDIS) 80 MG tablet, Take 80 mg by mouth daily., Disp: , Rfl:    Vitamin D, Ergocalciferol, (DRISDOL) 1.25 MG (50000 UNIT) CAPS capsule, Take 50,000 Units by mouth once a week., Disp: , Rfl:

## 2021-10-24 ENCOUNTER — Other Ambulatory Visit (HOSPITAL_COMMUNITY): Payer: Self-pay

## 2021-10-24 ENCOUNTER — Telehealth: Payer: Self-pay

## 2021-10-24 NOTE — Telephone Encounter (Signed)
Patient Advocate Encounter   Received notification from [facility] that prior authorization for Duoneb solution is required by his/her insurance [employer].   PA submitted on 10/24/21  Key#: [id number]  Status is pending    [facility] will continue to follow:  Patient Advocate Fax:  [phone]

## 2021-10-25 MED ORDER — IPRATROPIUM-ALBUTEROL 0.5-2.5 (3) MG/3ML IN SOLN: RESPIRATORY_TRACT | 1 refills | Status: DC

## 2021-10-25 NOTE — Telephone Encounter (Signed)
Patient Advocate Encounter  Received notification from [employer] that the request for prior authorization for generic duoneb has been denied due to not being covered by part D benefit.      PA Case ID: [id number]

## 2021-10-25 NOTE — Addendum Note (Signed)
Addended by: Rosana Berger on: 10/25/2021 03:48 PM   Modules accepted: Orders

## 2021-10-30 DIAGNOSIS — R590 Localized enlarged lymph nodes: Secondary | ICD-10-CM | POA: Diagnosis not present

## 2021-10-30 DIAGNOSIS — J9811 Atelectasis: Secondary | ICD-10-CM | POA: Diagnosis not present

## 2021-10-30 DIAGNOSIS — C760 Malignant neoplasm of head, face and neck: Secondary | ICD-10-CM | POA: Diagnosis not present

## 2021-10-30 DIAGNOSIS — C320 Malignant neoplasm of glottis: Secondary | ICD-10-CM | POA: Diagnosis not present

## 2021-10-30 DIAGNOSIS — J189 Pneumonia, unspecified organism: Secondary | ICD-10-CM | POA: Diagnosis not present

## 2021-10-30 DIAGNOSIS — R161 Splenomegaly, not elsewhere classified: Secondary | ICD-10-CM | POA: Diagnosis not present

## 2021-12-23 IMAGING — DX DG CHEST 1V PORT
1 series · 1 of 1 positions shown · non-contrast
Comparison: CT chest 10/30/2017.

CLINICAL DATA: Cough, fever.

EXAM:
PORTABLE CHEST 1 VIEW

[chest ap]
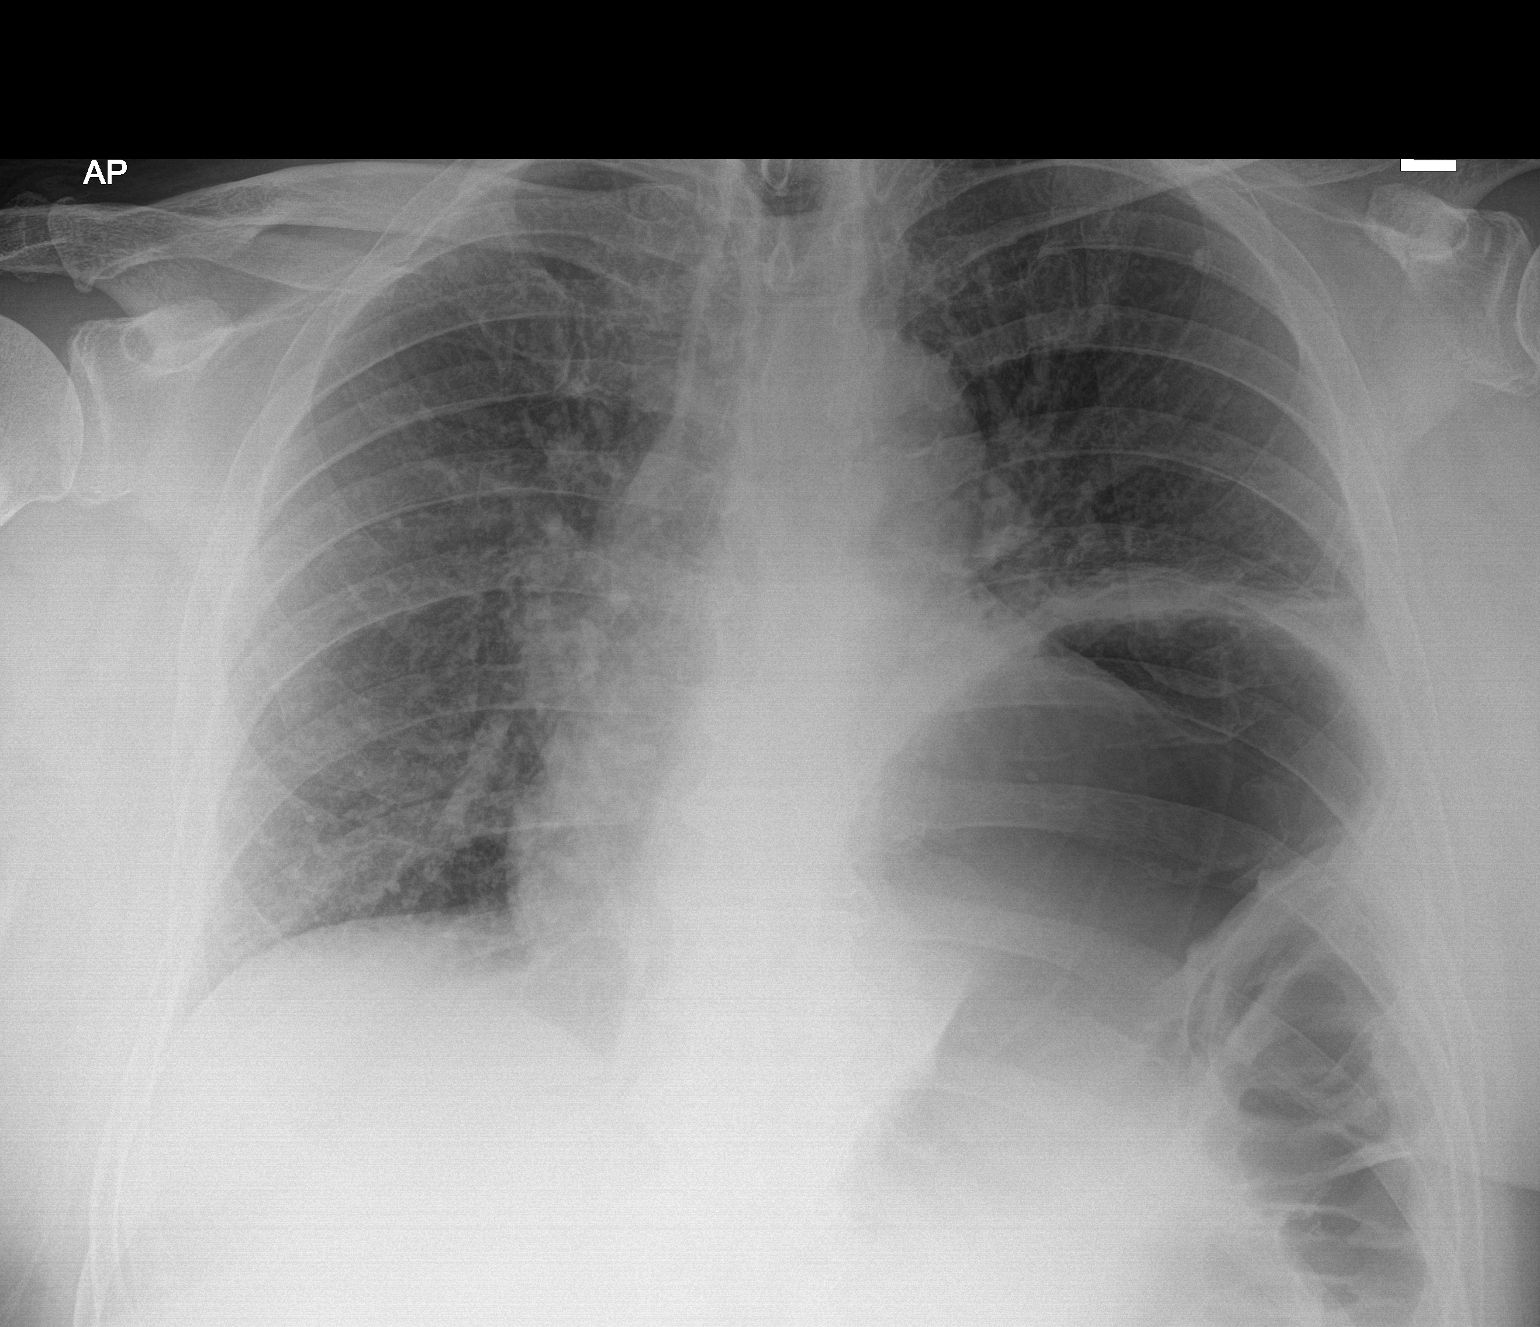

[1 of 1 positions shown; findings below may reference images not displayed]

FINDINGS: Borderline enlarged cardiac silhouette. Marked elevation left
hemidiaphragm. Overlying linear left basilar opacities. No visible
pleural effusions or pneumothorax. No acute osseous abnormality.
Gaseous dilation of stomach and colon in the partially imaged left
upper abdomen.
IMPRESSION: 1. Markedly elevated left hemidiaphragm. Overlying opacities most
likely represent atelectasis, although aspiration and/or pneumonia
is not excluded.
2. Gaseous dilation of stomach and colon in the left upper abdomen.
Dedicated abdominal radiographs to further evaluate if clinically
indicated.

## 2022-03-21 ENCOUNTER — Telehealth: Payer: Self-pay | Admitting: Pulmonary Disease

## 2022-03-21 DIAGNOSIS — J0410 Acute tracheitis without obstruction: Secondary | ICD-10-CM

## 2022-03-21 MED ORDER — AMOXICILLIN-POT CLAVULANATE 875-125 MG PO TABS: 1.0000 | ORAL_TABLET | Freq: Two times a day (BID) | ORAL | 0 refills | Status: DC

## 2022-03-21 NOTE — Telephone Encounter (Signed)
Patient is aware of below message and voiced his understanding.  Nothing further needed.   

## 2022-03-21 NOTE — Telephone Encounter (Signed)
Spoke to patient.  ?C/o sore throat, prod cough with thick green sputum and temp of 99.5. sx started yesterday.   ?SOB is baseline. ?Denied f/c/s or additional sx. ?Recommended covid test. He stated that he would take a home test today.  ?Using duoneb 3-4x daily.  ?He is requesting an abx. ? ? ?Dr. Erin Fulling, please advise. Thanks ?

## 2022-03-21 NOTE — Telephone Encounter (Signed)
Augmentin 1 tab BID sent in. ? ?Thanks, ?JD ? ?

## 2022-03-21 NOTE — Telephone Encounter (Signed)
pt is asking for a medication to be sent in. pt states he has experienced this before and has ended up in the hospital. pt is experincing (low grade fever, chest pain when coughing and thick green mucus ) pt is a cancer pt and has a treatment on 5/8 & 05/9 ? ? ?[facility] in [place] ([address]) ?

## 2022-08-30 ENCOUNTER — Ambulatory Visit (INDEPENDENT_AMBULATORY_CARE_PROVIDER_SITE_OTHER): Payer: PPO | Admitting: Nurse Practitioner

## 2022-08-30 ENCOUNTER — Telehealth: Payer: Self-pay | Admitting: Nurse Practitioner

## 2022-08-30 ENCOUNTER — Ambulatory Visit (INDEPENDENT_AMBULATORY_CARE_PROVIDER_SITE_OTHER): Payer: PPO

## 2022-08-30 ENCOUNTER — Encounter: Payer: Self-pay | Admitting: Nurse Practitioner

## 2022-08-30 VITALS — BP 114/78 | Ht 65.0 in | Wt 185.6 lb

## 2022-08-30 DIAGNOSIS — J189 Pneumonia, unspecified organism: Secondary | ICD-10-CM | POA: Diagnosis not present

## 2022-08-30 DIAGNOSIS — J440 Chronic obstructive pulmonary disease with acute lower respiratory infection: Secondary | ICD-10-CM | POA: Diagnosis not present

## 2022-08-30 DIAGNOSIS — J1282 Pneumonia due to coronavirus disease 2019: Secondary | ICD-10-CM

## 2022-08-30 DIAGNOSIS — J449 Chronic obstructive pulmonary disease, unspecified: Secondary | ICD-10-CM

## 2022-08-30 DIAGNOSIS — J0410 Acute tracheitis without obstruction: Secondary | ICD-10-CM

## 2022-08-30 DIAGNOSIS — U071 COVID-19: Secondary | ICD-10-CM

## 2022-08-30 MED ORDER — PREDNISONE 20 MG PO TABS: 40.0000 mg | ORAL_TABLET | Freq: Every day | ORAL | 0 refills | Status: AC

## 2022-08-30 MED ORDER — IPRATROPIUM-ALBUTEROL 0.5-2.5 (3) MG/3ML IN SOLN: RESPIRATORY_TRACT | 5 refills | Status: DC

## 2022-08-30 MED ORDER — LEVOFLOXACIN 500 MG PO TABS: 500.0000 mg | ORAL_TABLET | Freq: Every day | ORAL | 0 refills | Status: AC

## 2022-08-30 NOTE — Telephone Encounter (Signed)
Patient Instructions  Restart duoneb (ipatropium-albuterol) 3 mL every 6 hours as needed for shortness of breath/wheezing. Use at least twice a day, morning and evening Mucinex over the counter Twice daily to help thin secretions/chest congestion   Prednisone 40 mg daily for 5 days. Take in AM with food   Sputum culture    Chest x ray today    Follow up in 7-10 days with Dr. Erin Fulling or Alanson Aly. If symptoms do not improve or worsen, please contact office for sooner follow up or seek emergency care.     Rx for duoneb sol has been sent to pharmacy for pt. Called and spoke with pt's spouse Almyra Free letting her know this had been done and stated to her that they would receive nebulizer from DME. Stated to her once received the neb machine that pt could then use sol and she verbalized understanding. Nothing further needed.

## 2022-08-30 NOTE — Progress Notes (Addendum)
@Patient  ID: Alexander Ramirez, male    DOB: Mar 21, 1951, 71 y.o.   MRN: [id number]  Chief Complaint  Patient presents with   Follow-up    Referring provider: Colonel Bald, MD  HPI: 71 year old male, former smoker with laryngeal cancer s/p layngectomy and tracheostomy in 2019. Followed for tracheitis, pulmonary nodules, nocturnal hypoxemia, and COPD. He is a patient of Dr. August Albino and last seen in office 10/23/2021. Past medical history significant for GERD, paralyzed hemidiaphragm, HTN, AF.  TEST/EVENTS:  10/30/2021 CT chest wo contrast: segmental atelectasis in the LLL and lingular, unchanged. There is mild paraseptal emphysema. Calcified granuloma in the LUL, unchanged. Otherwise, lungs are clear. There are stable prominent subcarinal and right paraesophageal lymph nodes. There was splenomegaly and enlarged mesenteric lymph nodes, possible related to lymphoproliferative disease  10/23/2021: OV with Dr. Erin Fulling. Placed on Augmentin 12/1 for tracheitis with thick secretions and SOB. Symptoms are improving. Usually feels more short winded during winter months. He was unable to afford triple therapy nebs; orders sent for duonebs. Recommended CT chest with CT neck for follow up on pulmonary nodules.  08/29/2022: Today - acute Patient presents today for acute visit with his wife. He was doing okay since he was here last. He was diagnosed with Non-Hodgkin's Lymphoma. He has completed Unfortunately, he contracted COVID the beginning of September; thinks it was around 9/1 that he had a positive result. His PCP treated him with paxlovid. He was feeling better but then developed a productive cough the week after. He was treated with a z pack, which he says did nothing for him. He then saw his oncologist a few weeks ago who put him on a 10 day course of doxycycline which he finished yesterday. Today, he tells me that he's not feeling any better. He's actually felt worse over the past week with  decrease appetite and fatigue. His cough remains productive with yellow sputum. Feels more short winded than usual and has a lot of chest congestion. He denies any fevers, chills, hemoptysis, nausea/vomiting, night sweats. He is drinking plenty of fluids. He's not using any bronchodilators currently. Occasionally uses a saline neb to help loosen sputum.   No Known Allergies  Immunization History  Administered Date(s) Administered   Fluad Quad(high Dose 65+) 11/01/2020   Moderna Sars-Covid-2 Vaccination 11/06/2020   Pneumococcal Polysaccharide-23 05/26/2018   Pneumococcal-Unspecified 09/26/2019    Past Medical History:  Diagnosis Date   Arthritis    Cancer ([place])    throat cancer   GERD (gastroesophageal reflux disease)    OTC meds   Gout    Hemidiaphragm paralysis    History of radiation therapy 05/26/2017- 07/02/2017   Larynx 63 Gy in 28 fractions.    Hypercholesteremia    Hypertension    Kidney stones    Primary localized osteoarthritis of right knee 12/09/2017   Sleep apnea    uses CPAP nightly    Tobacco History: Social History   Tobacco Use  Smoking Status Former   Packs/day: 1.00   Years: 30.00   Total pack years: 30.00   Types: Cigarettes   Quit date: 1992   Years since quitting: 31.8  Smokeless Tobacco Never   Counseling given: Not Answered   Outpatient Medications Prior to Visit  Medication Sig Dispense Refill   allopurinol (ZYLOPRIM) 100 MG tablet Take 200 mg by mouth daily.     amoxicillin-clavulanate (AUGMENTIN) 875-125 MG tablet Take 1 tablet by mouth 2 (two) times daily. 14 tablet 0  carvedilol (COREG) 25 MG tablet Take 25 mg by mouth 2 (two) times daily with a meal.     ipratropium-albuterol (DUONEB) 0.5-2.5 (3) MG/3ML SOLN Take 20mL by nebulization every 4-6 hours as needed. 360 mL 1   omeprazole (PRILOSEC OTC) 20 MG tablet Take 20 mg by mouth daily.     telmisartan (MICARDIS) 80 MG tablet Take 80 mg by mouth daily.     Vitamin D, Ergocalciferol,  (DRISDOL) 1.25 MG (50000 UNIT) CAPS capsule Take 50,000 Units by mouth once a week. (Patient not taking: Reported on 08/30/2022)     No facility-administered medications prior to visit.     Review of Systems:   Constitutional: No weight loss or gain, night sweats, fevers, chills +fatigue, lassitude. HEENT: No headaches, difficulty swallowing, tooth/dental problems, or sore throat. No sneezing, itching, ear ache, nasal congestion, or post nasal drip CV:  No chest pain, orthopnea, PND, swelling in lower extremities, anasarca, dizziness, palpitations, syncope Resp: +shortness of breath with exertion; productive cough; occasional wheeze.  No hemoptysis. No chest wall deformity GI:  +anorexia. No heartburn, indigestion, abdominal pain, nausea, vomiting, diarrhea, change in bowel habits, bloody stools.  MSK:  No joint pain or swelling.  No decreased range of motion.  No back pain. Neuro: No dizziness or lightheadedness.  Psych: No depression or anxiety. Mood stable.     Physical Exam:  BP 114/78 (BP Location: Right Arm, Cuff Size: Normal)   Ht 5\' 5"  (1.651 m)   Wt 185 lb 9.6 oz (84.2 kg)   SpO2 99%   BMI 30.89 kg/m   GEN: Pleasant, interactive, chronically-ill appearing; obese; in no acute distress. HEENT:  Normocephalic and atraumatic. PERRLA. Sclera white. Nasal turbinates pink, moist and patent bilaterally. No rhinorrhea present. Oropharynx pink and moist, without exudate or edema. No lesions, ulcerations, or postnasal drip.  NECK:  Supple w/ fair ROM. No JVD present. No lymphadenopathy. Laryngectomy stoma  CV: RRR, no m/r/g, no peripheral edema. Pulses intact, +2 bilaterally. No cyanosis, pallor or clubbing. PULMONARY:  Unlabored, regular breathing. Diminished LLL; minimal scatter rhonchi bilaterally A&P. No accessory muscle use.  GI: BS present and normoactive. Soft, non-tender to palpation. No organomegaly or masses detected.  MSK: No erythema, warmth or tenderness. No deformities  or joint swelling noted.  Neuro: A/Ox3. No focal deficits noted.   Skin: Warm, no lesions or rashe Psych: Normal affect and behavior. Judgement and thought content appropriate.     Lab Results:  CBC    Component Value Date/Time   WBC 14.5 (H) 11/01/2020 0207   RBC 4.75 11/01/2020 0207   HGB 14.5 11/01/2020 0207   HGB 14.9 10/31/2017 0926   HCT 44.4 11/01/2020 0207   HCT 45.7 10/31/2017 0926   PLT 216 11/01/2020 0207   PLT 145 10/31/2017 0926   MCV 93.5 11/01/2020 0207   MCV 93.5 10/31/2017 0926   MCH 30.5 11/01/2020 0207   MCHC 32.7 11/01/2020 0207   RDW 13.2 11/01/2020 0207   RDW 13.5 10/31/2017 0926   LYMPHSABS 0.7 11/01/2020 0207   LYMPHSABS 1.8 10/31/2017 0926   MONOABS 0.5 11/01/2020 0207   MONOABS 0.7 10/31/2017 0926   EOSABS 0.0 11/01/2020 0207   EOSABS 0.2 10/31/2017 0926   BASOSABS 0.0 11/01/2020 0207   BASOSABS 0.0 10/31/2017 0926    BMET    Component Value Date/Time   NA 141 11/01/2020 0207   NA 143 10/31/2017 0926   K 4.2 11/01/2020 0207   K 4.6 10/31/2017 0926   CL 105  11/01/2020 0207   CO2 25 11/01/2020 0207   CO2 23 10/31/2017 0926   GLUCOSE 155 (H) 11/01/2020 0207   GLUCOSE 80 10/31/2017 0926   BUN 27 (H) 11/01/2020 0207   BUN 25.4 10/31/2017 0926   CREATININE 0.76 11/01/2020 0207   CREATININE 0.8 10/31/2017 0926   CALCIUM 8.6 (L) 11/01/2020 0207   CALCIUM 9.4 10/31/2017 0926   GFRNONAA >60 11/01/2020 0207   GFRAA >60 12/10/2017 0849    BNP No results found for: "BNP"   Imaging:  DG Chest 2 View  Result Date: 08/30/2022 CLINICAL DATA:  Productive cough EXAM: CHEST - 2 VIEW COMPARISON:  11/01/2020 FINDINGS: Redemonstrated elevation of the left hemidiaphragm with some associated opacities, most likely atelectasis. No other focal pulmonary opacity. No pleural effusion or pneumothorax. Mild cardiomegaly. No acute osseous abnormality. IMPRESSION: Redemonstrated elevation of the left hemidiaphragm with some associated opacities, most likely  atelectasis, although a superimposed infectious process cannot be excluded. Electronically Signed   By: Merilyn Baba M.D.   On: 08/30/2022 12:22          No data to display          No results found for: "NITRICOXIDE"      Assessment & Plan:   CAP (community acquired pneumonia) Scattered opacities on imaging today. Given his infectious symptoms, suspect this is a pneumonia related to recent COVID. Non-toxic on exam with stable VS on room air. Advised we treat him with levaquin 7 day course. Encouraged him to target mucociliary clearance. With lack of response to previous abx, check sputum culture today. Strict ED/return precautions.  Patient Instructions  Restart duoneb (ipatropium-albuterol) 3 mL every 6 hours as needed for shortness of breath/wheezing. Use at least twice a day, morning and evening Mucinex over the counter Twice daily to help thin secretions/chest congestion  Prednisone 40 mg daily for 5 days. Take in AM with food  Sputum culture   Chest x ray today   Follow up in 7-10 days with Dr. Erin Fulling or Alanson Aly. If symptoms do not improve or worsen, please contact office for sooner follow up or seek emergency care.     Tracheitis Tracheitis secondary to COVID infection. No stridor. See above. Prednisone burst to target inflammation.  COPD (chronic obstructive pulmonary disease) (HCC) Possible AECOPD. He is not on any maintenance inhalers/nebs currently. I advised him to start duonebs back. New supplies for neb sent today.    I spent 38 minutes of dedicated to the care of this patient on the date of this encounter to include pre-visit review of records, face-to-face time with the patient discussing conditions above, post visit ordering of testing, clinical documentation with the electronic health record, making appropriate referrals as documented, and communicating necessary findings to members of the patients care team.  Clayton Bibles,  NP 08/30/2022  Pt aware and understands NP's role.

## 2022-08-30 NOTE — Assessment & Plan Note (Signed)
Possible AECOPD. He is not on any maintenance inhalers/nebs currently. I advised him to start duonebs back. New supplies for neb sent today.

## 2022-08-30 NOTE — Assessment & Plan Note (Addendum)
Tracheitis secondary to COVID infection. No stridor. See above. Prednisone burst to target inflammation.

## 2022-08-30 NOTE — Patient Instructions (Addendum)
Restart duoneb (ipatropium-albuterol) 3 mL every 6 hours as needed for shortness of breath/wheezing. Use at least twice a day, morning and evening Mucinex over the counter Twice daily to help thin secretions/chest congestion  Prednisone 40 mg daily for 5 days. Take in AM with food  Sputum culture   Chest x ray today   Follow up in 7-10 days with Dr. Erin Fulling or Alanson Aly. If symptoms do not improve or worsen, please contact office for sooner follow up or seek emergency care.

## 2022-08-30 NOTE — Assessment & Plan Note (Addendum)
Scattered opacities on imaging today. Given his infectious symptoms, suspect this is a pneumonia related to recent COVID. Non-toxic on exam with stable VS on room air. Advised we treat him with levaquin 7 day course. Encouraged him to target mucociliary clearance. With lack of response to previous abx, check sputum culture today. Strict ED/return precautions.  Patient Instructions  Restart duoneb (ipatropium-albuterol) 3 mL every 6 hours as needed for shortness of breath/wheezing. Use at least twice a day, morning and evening Mucinex over the counter Twice daily to help thin secretions/chest congestion  Prednisone 40 mg daily for 5 days. Take in AM with food  Sputum culture   Chest x ray today   Follow up in 7-10 days with Dr. Erin Fulling or Alanson Aly. If symptoms do not improve or worsen, please contact office for sooner follow up or seek emergency care.

## 2022-09-02 ENCOUNTER — Other Ambulatory Visit: Payer: Self-pay | Admitting: Nurse Practitioner

## 2022-09-02 ENCOUNTER — Other Ambulatory Visit: Payer: PPO

## 2022-09-02 DIAGNOSIS — J189 Pneumonia, unspecified organism: Secondary | ICD-10-CM

## 2022-09-02 DIAGNOSIS — J440 Chronic obstructive pulmonary disease with acute lower respiratory infection: Secondary | ICD-10-CM

## 2022-09-02 DIAGNOSIS — J0410 Acute tracheitis without obstruction: Secondary | ICD-10-CM

## 2022-09-05 ENCOUNTER — Ambulatory Visit: Payer: PPO | Admitting: Adult Health

## 2022-09-07 LAB — RESPIRATORY CULTURE OR RESPIRATORY AND SPUTUM CULTURE
MICRO NUMBER:: 14055149
SPECIMEN QUALITY:: ADEQUATE

## 2022-09-11 NOTE — Progress Notes (Signed)
Sputum culture grew out a bacteria called pseudomonas. It is sensitive to levaquin, which is what we treated him with. We can discuss further tomorrow if he has any questions. Thanks.

## 2022-09-12 ENCOUNTER — Ambulatory Visit (INDEPENDENT_AMBULATORY_CARE_PROVIDER_SITE_OTHER): Payer: PPO | Admitting: Nurse Practitioner

## 2022-09-12 ENCOUNTER — Telehealth: Payer: Self-pay | Admitting: Nurse Practitioner

## 2022-09-12 ENCOUNTER — Encounter: Payer: Self-pay | Admitting: Nurse Practitioner

## 2022-09-12 DIAGNOSIS — J0410 Acute tracheitis without obstruction: Secondary | ICD-10-CM

## 2022-09-12 DIAGNOSIS — J449 Chronic obstructive pulmonary disease, unspecified: Secondary | ICD-10-CM

## 2022-09-12 DIAGNOSIS — J189 Pneumonia, unspecified organism: Secondary | ICD-10-CM | POA: Diagnosis not present

## 2022-09-12 MED ORDER — LEVOFLOXACIN 500 MG PO TABS: 500.0000 mg | ORAL_TABLET | Freq: Every day | ORAL | 0 refills | Status: DC

## 2022-09-12 NOTE — Assessment & Plan Note (Signed)
Clinically improved.  He is maintained on DuoNebs.  See above plan.

## 2022-09-12 NOTE — Progress Notes (Signed)
@Patient  ID: Alexander Ramirez, male    DOB: Feb 22, 1951, 71 y.o.   MRN: [id number]  Chief Complaint  Patient presents with   Follow-up    Pt f/u he is feeling some better, he still reports it doesn't feel right.    Referring provider: Colonel Bald, MD  HPI: 71 year old male, former smoker with laryngeal cancer s/p layngectomy and tracheostomy in 2019. Followed for tracheitis, pulmonary nodules, nocturnal hypoxemia, and COPD. He is a patient of Dr. August Albino and last seen in office 08/30/2022 by [facility] NP. Past medical history significant for GERD, paralyzed hemidiaphragm, HTN, AF.  TEST/EVENTS:  10/30/2021 CT chest wo contrast: segmental atelectasis in the LLL and lingular, unchanged. There is mild paraseptal emphysema. Calcified granuloma in the LUL, unchanged. Otherwise, lungs are clear. There are stable prominent subcarinal and right paraesophageal lymph nodes. There was splenomegaly and enlarged mesenteric lymph nodes, possible related to lymphoproliferative disease 08/29/2022 sputum culture with pseudomonas   10/23/2021: OV with Dr. Erin Fulling. Placed on Augmentin 12/1 for tracheitis with thick secretions and SOB. Symptoms are improving. Usually feels more short winded during winter months. He was unable to afford triple therapy nebs; orders sent for duonebs. Recommended CT chest with CT neck for follow up on pulmonary nodules.  08/29/2022: OV with Lee Kalt NP for acute visit with his wife. He was doing okay since he was here last. He was diagnosed with Non-Hodgkin's Lymphoma. He has completed Unfortunately, he contracted COVID the beginning of September; thinks it was around 9/1 that he had a positive result. His PCP treated him with paxlovid. He was feeling better but then developed a productive cough the week after. He was treated with a z pack, which he says did nothing for him. He then saw his oncologist a few weeks ago who put him on a 10 day course of doxycycline which he finished  yesterday. Today, he tells me that he's not feeling any better. He's actually felt worse over the past week with decrease appetite and fatigue. His cough remains productive with yellow sputum. Feels more short winded than usual and has a lot of chest congestion. He denies any fevers, chills, hemoptysis, nausea/vomiting, night sweats. He is drinking plenty of fluids. He's not using any bronchodilators currently. Occasionally uses a saline neb to help loosen sputum. Treated for tracheitis with prednisone; sputum culture ordered. CXR with increased opacities; treated with levaquin 7 day course.   09/12/2022: Today - follow up Patient presents today for follow-up with his wife.  He is feeling some better today.  He completed Levaquin course around 10/19.  He did notice that his cough started becoming a little more productive after he finished this.  Still having some occasional chest congestion.  Fatigue feels like it is improving but still worse than his baseline.  Breathing feels back to normal for the most part.  He is eating and drinking well again.  Denies any fevers, chills, hemoptysis, night sweats, nausea/vomiting.  He was using his DuoNebs 4 times a day.  Felt like these were starting to make him jittery and a little lightheaded so he is backed off to twice daily.  Occasionally uses a saline neb to help loosen sputum.  He is still taking Mucinex twice daily.  No Known Allergies  Immunization History  Administered Date(s) Administered   Fluad Quad(high Dose 65+) 11/01/2020   Moderna Sars-Covid-2 Vaccination 11/06/2020   Pneumococcal Polysaccharide-23 05/26/2018   Pneumococcal-Unspecified 09/26/2019    Past Medical History:  Diagnosis Date  Arthritis    Cancer ([place])    throat cancer   GERD (gastroesophageal reflux disease)    OTC meds   Gout    Hemidiaphragm paralysis    History of radiation therapy 05/26/2017- 07/02/2017   Larynx 63 Gy in 28 fractions.    Hypercholesteremia     Hypertension    Kidney stones    Primary localized osteoarthritis of right knee 12/09/2017   Sleep apnea    uses CPAP nightly    Tobacco History: Social History   Tobacco Use  Smoking Status Former   Packs/day: 1.00   Years: 30.00   Total pack years: 30.00   Types: Cigarettes   Quit date: 1992   Years since quitting: 31.8  Smokeless Tobacco Never   Counseling given: Not Answered   Outpatient Medications Prior to Visit  Medication Sig Dispense Refill   allopurinol (ZYLOPRIM) 100 MG tablet Take 200 mg by mouth daily.     carvedilol (COREG) 25 MG tablet Take 25 mg by mouth 2 (two) times daily with a meal.     ipratropium-albuterol (DUONEB) 0.5-2.5 (3) MG/3ML SOLN Take 37mL by nebulization every 4-6 hours as needed. 360 mL 5   omeprazole (PRILOSEC OTC) 20 MG tablet Take 20 mg by mouth daily.     telmisartan (MICARDIS) 80 MG tablet Take 80 mg by mouth daily.     amoxicillin-clavulanate (AUGMENTIN) 875-125 MG tablet Take 1 tablet by mouth 2 (two) times daily. (Patient not taking: Reported on 09/12/2022) 14 tablet 0   Vitamin D, Ergocalciferol, (DRISDOL) 1.25 MG (50000 UNIT) CAPS capsule Take 50,000 Units by mouth once a week. (Patient not taking: Reported on 08/30/2022)     No facility-administered medications prior to visit.     Review of Systems:   Constitutional: No weight loss or gain, night sweats, fevers, chills +fatigue (improving), lassitude. HEENT: No headaches, difficulty swallowing, tooth/dental problems, or sore throat. No sneezing, itching, ear ache, nasal congestion, or post nasal drip CV:  No chest pain, orthopnea, PND, swelling in lower extremities, anasarca, dizziness, palpitations, syncope Resp: +shortness of breath with exertion (baseline); productive cough. No wheeze.  No hemoptysis. No chest wall deformity GI:  No change in appetite, heartburn, indigestion, abdominal pain, nausea, vomiting, diarrhea, change in bowel habits, bloody stools.  MSK:  No joint pain  or swelling.  No decreased range of motion.  No back pain. Neuro: No dizziness or lightheadedness.  Psych: No depression or anxiety. Mood stable.     Physical Exam:  BP 110/78   Pulse 71   Ht 5\' 5"  (1.651 m)   Wt 187 lb 6.4 oz (85 kg)   SpO2 96%   BMI 31.18 kg/m   GEN: Pleasant, interactive, chronically-ill appearing; obese; in no acute distress. HEENT:  Normocephalic and atraumatic. PERRLA. Sclera white. Nasal turbinates pink, moist and patent bilaterally. No rhinorrhea present. Oropharynx pink and moist, without exudate or edema. No lesions, ulcerations, or postnasal drip.  NECK:  Supple w/ fair ROM. No JVD present. No lymphadenopathy. Laryngectomy stoma  CV: RRR, no m/r/g, no peripheral edema. Pulses intact, +2 bilaterally. No cyanosis, pallor or clubbing. PULMONARY:  Unlabored, regular breathing. Clear bilaterally A&P w/o wheezes/rales/rhonchi. No accessory muscle use.  GI: BS present and normoactive. Soft, non-tender to palpation. No organomegaly or masses detected.  MSK: No erythema, warmth or tenderness. No deformities or joint swelling noted.  Neuro: A/Ox3. No focal deficits noted.   Skin: Warm, no lesions or rashe Psych: Normal affect and behavior. Judgement and thought  content appropriate.     Lab Results:  CBC    Component Value Date/Time   WBC 14.5 (H) 11/01/2020 0207   RBC 4.75 11/01/2020 0207   HGB 14.5 11/01/2020 0207   HGB 14.9 10/31/2017 0926   HCT 44.4 11/01/2020 0207   HCT 45.7 10/31/2017 0926   PLT 216 11/01/2020 0207   PLT 145 10/31/2017 0926   MCV 93.5 11/01/2020 0207   MCV 93.5 10/31/2017 0926   MCH 30.5 11/01/2020 0207   MCHC 32.7 11/01/2020 0207   RDW 13.2 11/01/2020 0207   RDW 13.5 10/31/2017 0926   LYMPHSABS 0.7 11/01/2020 0207   LYMPHSABS 1.8 10/31/2017 0926   MONOABS 0.5 11/01/2020 0207   MONOABS 0.7 10/31/2017 0926   EOSABS 0.0 11/01/2020 0207   EOSABS 0.2 10/31/2017 0926   BASOSABS 0.0 11/01/2020 0207   BASOSABS 0.0 10/31/2017 0926     BMET    Component Value Date/Time   NA 141 11/01/2020 0207   NA 143 10/31/2017 0926   K 4.2 11/01/2020 0207   K 4.6 10/31/2017 0926   CL 105 11/01/2020 0207   CO2 25 11/01/2020 0207   CO2 23 10/31/2017 0926   GLUCOSE 155 (H) 11/01/2020 0207   GLUCOSE 80 10/31/2017 0926   BUN 27 (H) 11/01/2020 0207   BUN 25.4 10/31/2017 0926   CREATININE 0.76 11/01/2020 0207   CREATININE 0.8 10/31/2017 0926   CALCIUM 8.6 (L) 11/01/2020 0207   CALCIUM 9.4 10/31/2017 0926   GFRNONAA >60 11/01/2020 0207   GFRAA >60 12/10/2017 0849    BNP No results found for: "BNP"   Imaging:  DG Chest 2 View  Result Date: 08/30/2022 CLINICAL DATA:  Productive cough EXAM: CHEST - 2 VIEW COMPARISON:  11/01/2020 FINDINGS: Redemonstrated elevation of the left hemidiaphragm with some associated opacities, most likely atelectasis. No other focal pulmonary opacity. No pleural effusion or pneumothorax. Mild cardiomegaly. No acute osseous abnormality. IMPRESSION: Redemonstrated elevation of the left hemidiaphragm with some associated opacities, most likely atelectasis, although a superimposed infectious process cannot be excluded. Electronically Signed   By: Merilyn Baba M.D.   On: 08/30/2022 12:22          No data to display          No results found for: "NITRICOXIDE"      Assessment & Plan:   Tracheitis Recently treated for tracheitis with prednisone.  He had some increased opacities on imaging so he was started on Levaquin course.  Sputum culture was also collected which grew out out heavy growth of Pseudomonas, which was sensitive to levofloxacin.  Since completing Levaquin, he has had a slight increase in his cough again.  We will extend his Levaquin an additional 3 days with current infectious symptoms.  It is possible that he is colonized with Pseudomonas so we will need to monitor symptoms moving forward.  Encouraged him to continue mucociliary clearance therapies.  Okay to decrease DuoNebs to  twice daily.   Patient Instructions  Continue duoneb (ipatropium-albuterol) 3 mL every 6 hours as needed for shortness of breath/wheezing. Use at least twice a day, morning and evening Continue Mucinex over the counter Twice daily to help thin secretions/chest congestion   Extend levaquin 500 mg daily for additional 3 days. Take with food   Follow up in 6 weeks with Dr. Erin Fulling or Alanson Aly with repeat chest x ray. If symptoms do not improve or worsen, please contact office for sooner follow up or seek emergency care.  CAP (community acquired pneumonia) Slight increase in opacities on imaging.  Possible CAP.  Treated with Levaquin course.  See above plan.  COPD (chronic obstructive pulmonary disease) (HCC) Clinically improved.  He is maintained on DuoNebs.  See above plan.    I spent 32 minutes of dedicated to the care of this patient on the date of this encounter to include pre-visit review of records, face-to-face time with the patient discussing conditions above, post visit ordering of testing, clinical documentation with the electronic health record, making appropriate referrals as documented, and communicating necessary findings to members of the patients care team.  Clayton Bibles, NP 09/12/2022  Pt aware and understands NP's role.

## 2022-09-12 NOTE — Telephone Encounter (Signed)
Patient Instructions  Continue duoneb (ipatropium-albuterol) 3 mL every 6 hours as needed for shortness of breath/wheezing. Use at least twice a day, morning and evening Continue Mucinex over the counter Twice daily to help thin secretions/chest congestion   Extend levaquin 500 mg daily for additional 3 days. Take with food    Follow up in 6 weeks with Dr. Erin Fulling or Alanson Aly with repeat chest x ray. If symptoms do not improve or worsen, please contact office for sooner follow up or seek emergency care.      Rx has been sent to preferred pharmacy for pt. Called and spoke with pt letting him know this had been done and he verbalized understanding. Nothing further needed.

## 2022-09-12 NOTE — Assessment & Plan Note (Signed)
Slight increase in opacities on imaging.  Possible CAP.  Treated with Levaquin course.  See above plan.

## 2022-09-12 NOTE — Assessment & Plan Note (Addendum)
Recently treated for tracheitis with prednisone.  He had some increased opacities on imaging so he was started on Levaquin course.  Sputum culture was also collected which grew out out heavy growth of Pseudomonas, which was sensitive to levofloxacin.  Since completing Levaquin, he has had a slight increase in his cough again.  We will extend his Levaquin an additional 3 days with current infectious symptoms.  It is possible that he is colonized with Pseudomonas so we will need to monitor symptoms moving forward.  Encouraged him to continue mucociliary clearance therapies.  Okay to decrease DuoNebs to twice daily.   Patient Instructions  Continue duoneb (ipatropium-albuterol) 3 mL every 6 hours as needed for shortness of breath/wheezing. Use at least twice a day, morning and evening Continue Mucinex over the counter Twice daily to help thin secretions/chest congestion   Extend levaquin 500 mg daily for additional 3 days. Take with food   Follow up in 6 weeks with Dr. Erin Fulling or Alanson Aly with repeat chest x ray. If symptoms do not improve or worsen, please contact office for sooner follow up or seek emergency care.

## 2022-09-12 NOTE — Patient Instructions (Addendum)
Continue duoneb (ipatropium-albuterol) 3 mL every 6 hours as needed for shortness of breath/wheezing. Use at least twice a day, morning and evening Continue Mucinex over the counter Twice daily to help thin secretions/chest congestion   Extend levaquin 500 mg daily for additional 3 days. Take with food   Follow up in 6 weeks with Dr. Erin Fulling or Alanson Aly with repeat chest x ray. If symptoms do not improve or worsen, please contact office for sooner follow up or seek emergency care.

## 2022-10-24 ENCOUNTER — Ambulatory Visit (INDEPENDENT_AMBULATORY_CARE_PROVIDER_SITE_OTHER): Payer: PPO

## 2022-10-24 ENCOUNTER — Encounter: Payer: Self-pay | Admitting: Pulmonary Disease

## 2022-10-24 ENCOUNTER — Ambulatory Visit (INDEPENDENT_AMBULATORY_CARE_PROVIDER_SITE_OTHER): Payer: PPO | Admitting: Pulmonary Disease

## 2022-10-24 VITALS — BP 132/84 | HR 67 | Temp 98.0°F | Ht 65.0 in | Wt 189.8 lb

## 2022-10-24 DIAGNOSIS — J0410 Acute tracheitis without obstruction: Secondary | ICD-10-CM | POA: Diagnosis not present

## 2022-10-24 DIAGNOSIS — J189 Pneumonia, unspecified organism: Secondary | ICD-10-CM

## 2022-10-24 DIAGNOSIS — R053 Chronic cough: Secondary | ICD-10-CM

## 2022-10-24 MED ORDER — BUDESONIDE 0.5 MG/2ML IN SUSP: 0.5000 mg | Freq: Two times a day (BID) | RESPIRATORY_TRACT | 6 refills | Status: AC

## 2022-10-24 NOTE — Patient Instructions (Addendum)
Start budesonide nebulizer treatment twice daily  Continue to use duoneb nebulizer treatments twice daily  We will check chest x-ray today  Follow up in 2 months

## 2022-10-24 NOTE — Progress Notes (Signed)
Subjective:   PATIENT ID: Alexander Ramirez GENDER: male DOB: [birth year], MRN: [id number]   HPI  Chief Complaint  Patient presents with   Follow-up    Cough and SOB x 4 months.  Hx Throat cancer and non-Hodgkin's lymphoma.  COVID in September 2023.  Completed chemotherapy 07/02/22.   Alexander Ramirez is a 71 year old male, former smoker with laryngeal cancer s/p laryngectomy in 2019, GERD, paralyzed hemidiaphragm and pulmonary nodules who returns to pulmonary clinic for follow up.   He was seen in acute visit 10/13 and 10/26 for tracheitis and pneumonia by Roxan Diesel, NP. He was treated with course of levaquin. Sputum culture at that time grew pseudomonas sensitive to fluoroquinolones. He felt better after the levaquin. Prior to that he was treated with zpak then doxycycline. He continues to have dry cough in the morning and then it can be productive. He will wake up at night with dry cough. He is using duoneb nebulizer treatments twice daily.  OV 10/23/21 He was started on augmentin on 12/1 for concern of tracheitis as he was having thick secretions and shortness of breath. He reports his symptoms are much improved since starting the antibiotic and using saline nebulizer treatments. He feels he has more shortness of breath in the Winter time.  He has follow up with ENT at [facility] in the near future.   OV 02/05/21 He has been doing well since last visit. He is using saline nebulizer treatments for pulmonary hygiene and mucous clearance. His breathing is doing well at this time and he has not had any further hemoptysis. He has follow up soon with ENT for his cancer surveillance.   His lung cancer screening CT was denied by his insurance.   He is not using nocturnal oxygen as the compressor was too loud.  OV 12/01/20: He was hospitalized 10/29/20 to 11/01/20 and underwent bronchoscopy which was notable for friable bronchial mucosa throughout secondary tot he influenza infection. The hemoptysis  has since resolved along with his shortness of breath. He reports he has stopped wearing supplemental oxygen at home. On ambulatory oxygen monitoring today he saturated 99% on room air at rest and 94% on room air at the end of 3 laps.   He has been prescribed budeonisde, brovana and yupelri nebulizer treatments at last follow up visit. These medications have been hard to obtain due to the cost. He has been using yupelri as he was provided samples of this. He has not noticed much of a difference in his breathing with these nebulizer treatments.  He was previously receiving low dose CT chest scans for lung cancer screening. Last CT chest scan was in 2019 at [facility]. No suspicious nodules noted at that time.    Past Medical History:  Diagnosis Date   Arthritis    Cancer ([place])    throat cancer   GERD (gastroesophageal reflux disease)    OTC meds   Gout    Hemidiaphragm paralysis    History of radiation therapy 05/26/2017- 07/02/2017   Larynx 63 Gy in 28 fractions.    Hypercholesteremia    Hypertension    Kidney stones    Primary localized osteoarthritis of right knee 12/09/2017   Sleep apnea    uses CPAP nightly     Family History  Problem Relation Age of Onset   Lung cancer Father        Passed at the age of 61     Social History   Socioeconomic History  Marital status: Married    Spouse name: Not on file   Number of children: Not on file   Years of education: Not on file   Highest education level: Not on file  Occupational History   Not on file  Tobacco Use   Smoking status: Former    Packs/day: 1.00    Years: 30.00    Total pack years: 30.00    Types: Cigarettes, Cigars    Quit date: 59    Years since quitting: 31.9   Smokeless tobacco: Never   Tobacco comments:    Smoked cigars until 2013  Vaping Use   Vaping Use: Never used  Substance and Sexual Activity   Alcohol use: Yes    Comment: occ   Drug use: No   Sexual activity: Not on file  Other Topics Concern   Not  on file  Social History Narrative   Not on file   Social Determinants of Health   Financial Resource Strain: Not on file  Food Insecurity: Not on file  Transportation Needs: Not on file  Physical Activity: Not on file  Stress: Not on file  Social Connections: Not on file  Intimate Partner Violence: Not on file     No Known Allergies   Outpatient Medications Prior to Visit  Medication Sig Dispense Refill   allopurinol (ZYLOPRIM) 100 MG tablet Take 200 mg by mouth daily.     carvedilol (COREG) 25 MG tablet Take 25 mg by mouth 2 (two) times daily with a meal.     ipratropium-albuterol (DUONEB) 0.5-2.5 (3) MG/3ML SOLN Take 18mL by nebulization every 4-6 hours as needed. 360 mL 5   omeprazole (PRILOSEC OTC) 20 MG tablet Take 20 mg by mouth daily.     telmisartan (MICARDIS) 80 MG tablet Take 80 mg by mouth daily.     amoxicillin-clavulanate (AUGMENTIN) 875-125 MG tablet Take 1 tablet by mouth 2 (two) times daily. (Patient not taking: Reported on 09/12/2022) 14 tablet 0   levofloxacin (LEVAQUIN) 500 MG tablet Take 1 tablet (500 mg total) by mouth daily. 3 tablet 0   No facility-administered medications prior to visit.    Review of Systems  Constitutional:  Negative for chills, fever, malaise/fatigue and weight loss.  HENT:  Negative for congestion, sinus pain and sore throat.   Eyes: Negative.   Respiratory:  Positive for cough and sputum production. Negative for hemoptysis, shortness of breath and wheezing.   Cardiovascular:  Negative for chest pain, palpitations, orthopnea, claudication, leg swelling and PND.  Gastrointestinal:  Negative for abdominal pain, heartburn, nausea and vomiting.  Genitourinary: Negative.   Musculoskeletal: Negative.   Neurological: Negative.   Endo/Heme/Allergies: Negative.   Psychiatric/Behavioral: Negative.      Objective:   Vitals:   10/24/22 1028  BP: 132/84  Pulse: 67  Temp: 98 F (36.7 C)  TempSrc: Oral  SpO2: 99%  Weight: 189 lb 12.8  oz (86.1 kg)  Height: 5\' 5"  (1.651 m)   Physical Exam Constitutional:      General: He is not in acute distress.    Appearance: Normal appearance. He is obese. He is not ill-appearing.  HENT:     Head: Normocephalic and atraumatic.  Eyes:     Conjunctiva/sclera: Conjunctivae normal.  Neck:     Comments: Tracheostomy in place Cardiovascular:     Rate and Rhythm: Normal rate and regular rhythm.     Pulses: Normal pulses.     Heart sounds: Normal heart sounds. No murmur heard. Pulmonary:  Effort: Pulmonary effort is normal.     Breath sounds: Decreased air movement present.  Musculoskeletal:     Right lower leg: No edema.     Left lower leg: No edema.  Skin:    General: Skin is warm and dry.  Neurological:     General: No focal deficit present.     Mental Status: He is alert.  Psychiatric:        Mood and Affect: Mood normal.        Behavior: Behavior normal.        Thought Content: Thought content normal.        Judgment: Judgment normal.     CBC    Component Value Date/Time   WBC 14.5 (H) 11/01/2020 0207   RBC 4.75 11/01/2020 0207   HGB 14.5 11/01/2020 0207   HGB 14.9 10/31/2017 0926   HCT 44.4 11/01/2020 0207   HCT 45.7 10/31/2017 0926   PLT 216 11/01/2020 0207   PLT 145 10/31/2017 0926   MCV 93.5 11/01/2020 0207   MCV 93.5 10/31/2017 0926   MCH 30.5 11/01/2020 0207   MCHC 32.7 11/01/2020 0207   RDW 13.2 11/01/2020 0207   RDW 13.5 10/31/2017 0926   LYMPHSABS 0.7 11/01/2020 0207   LYMPHSABS 1.8 10/31/2017 0926   MONOABS 0.5 11/01/2020 0207   MONOABS 0.7 10/31/2017 0926   EOSABS 0.0 11/01/2020 0207   EOSABS 0.2 10/31/2017 0926   BASOSABS 0.0 11/01/2020 0207   BASOSABS 0.0 10/31/2017 0926      Latest Ref Rng & Units 11/01/2020    2:07 AM 10/31/2020    2:14 AM 10/30/2020    9:36 AM  BMP  Glucose 70 - 99 mg/dL 155  178  144   BUN 8 - 23 mg/dL 27  28  23    Creatinine 0.61 - 1.24 mg/dL 0.76  0.78  0.86   Sodium 135 - 145 mmol/L 141  141  141    Potassium 3.5 - 5.1 mmol/L 4.2  4.2  3.5   Chloride 98 - 111 mmol/L 105  105  102   CO2 22 - 32 mmol/L 25  24  25    Calcium 8.9 - 10.3 mg/dL 8.6  8.8  8.6    Chest imaging: CXR 08/30/22 Redemonstrated elevation of the left hemidiaphragm with some associated opacities, most likely atelectasis, although a superimposed infectious process cannot be excluded.  CXR 11/01/2020 Stable asymmetric elevation left hemidiaphragm. Collapse/consolidative opacity noted adjacent to the elevated diaphragm. Tiny left upper lobe pulmonary nodule, seen to be calcified granuloma on lung cancer screening CT 10/30/2017. Probable minimal atelectasis at the right base. The cardio pericardial silhouette is enlarged. There is pulmonary vascular congestion without overt pulmonary edema. Bones are diffusely demineralized  CT Chest 10/30/2017 Mediastinum/Nodes: No mediastinal lymphadenopathy. No evidence for gross hilar lymphadenopathy although assessment is limited by the lack of intravenous contrast on today's study. The esophagus has normal imaging features. There is no axillary lymphadenopathy.   Lungs/Pleura: Scattered tiny bilateral pulmonary nodules are again identified, many of which are densely calcified consistent with granulomata. No suspicious pulmonary nodule or mass. No focal airspace consolidation. No pulmonary edema or pleural effusion.  PFT:     No data to display            Assessment & Plan:   Community acquired pneumonia, unspecified laterality - Plan: DG Chest 2 View  Tracheitis - Plan: budesonide (PULMICORT) 0.5 MG/2ML nebulizer solution  Chronic cough - Plan: budesonide (PULMICORT) 0.5 MG/2ML nebulizer  solution  Discussion: Alexander Ramirez is a 71 year old male, former smoker with laryngeal cancer s/p laryngectomy in 2019, GERD, paralyzed hemidiaphragm and pulmonary nodules who returns to pulmonary clinic for follow up.   He has been treated for  tracheitis/community-acquired pneumonia with course of Levaquin with significant improvement in his symptoms.  He continues to have mostly dry cough with severe coughing episodes at times.  To continue DuoNeb nebulizer treatments twice daily and we will add twice daily budesonide nebulizer treatments for postinflammatory state after his recent infection.  We can consider oral steroid taper if he does not improve with the additional neb treatment.  Will repeat a chest x-ray today and if and if any abnormalities remain we will obtain a CT chest scan for further follow-up.  He is to follow up in 2 months.  Freda Jackson, MD [facility] Office: [phone]   Current Outpatient Medications:    allopurinol (ZYLOPRIM) 100 MG tablet, Take 200 mg by mouth daily., Disp: , Rfl:    budesonide (PULMICORT) 0.5 MG/2ML nebulizer solution, Take 2 mLs (0.5 mg total) by nebulization 2 (two) times daily., Disp: 140 mL, Rfl: 6   carvedilol (COREG) 25 MG tablet, Take 25 mg by mouth 2 (two) times daily with a meal., Disp: , Rfl:    ipratropium-albuterol (DUONEB) 0.5-2.5 (3) MG/3ML SOLN, Take 75mL by nebulization every 4-6 hours as needed., Disp: 360 mL, Rfl: 5   omeprazole (PRILOSEC OTC) 20 MG tablet, Take 20 mg by mouth daily., Disp: , Rfl:    telmisartan (MICARDIS) 80 MG tablet, Take 80 mg by mouth daily., Disp: , Rfl:    amoxicillin-clavulanate (AUGMENTIN) 875-125 MG tablet, Take 1 tablet by mouth 2 (two) times daily. (Patient not taking: Reported on 09/12/2022), Disp: 14 tablet, Rfl: 0   levofloxacin (LEVAQUIN) 500 MG tablet, Take 1 tablet (500 mg total) by mouth daily., Disp: 3 tablet, Rfl: 0

## 2022-12-05 ENCOUNTER — Ambulatory Visit (INDEPENDENT_AMBULATORY_CARE_PROVIDER_SITE_OTHER): Payer: PPO | Admitting: Pulmonary Disease

## 2022-12-05 ENCOUNTER — Encounter: Payer: Self-pay | Admitting: Pulmonary Disease

## 2022-12-05 VITALS — BP 118/72 | HR 65 | Ht 65.0 in | Wt 195.0 lb

## 2022-12-05 DIAGNOSIS — Z9889 Other specified postprocedural states: Secondary | ICD-10-CM | POA: Diagnosis not present

## 2022-12-05 DIAGNOSIS — J449 Chronic obstructive pulmonary disease, unspecified: Secondary | ICD-10-CM

## 2022-12-05 MED ORDER — SODIUM CHLORIDE 3 % IN NEBU: INHALATION_SOLUTION | Freq: Two times a day (BID) | RESPIRATORY_TRACT | 12 refills | Status: AC | PRN

## 2022-12-05 NOTE — Progress Notes (Signed)
Subjective:   PATIENT ID: Alexander Ramirez GENDER: male DOB: 1951-08-12, MRN: [id number]   HPI  Chief Complaint  Patient presents with   Follow-up    F/U. States he has been doing well since last visit.    Alexander Ramirez is a 72 year old male, former smoker with laryngeal cancer s/p laryngectomy in 2019, GERD, paralyzed hemidiaphragm and pulmonary nodules who returns to pulmonary clinic for follow up.   He is feeling better overall compared to last visit but continues to cough up small amounts of mucous plugs. He is using budesonide and duonebs twice daily.   OV 10/24/22 He was seen in acute visit 10/13 and 10/26 for tracheitis and pneumonia by Rhunette Croft, NP. He was treated with course of levaquin. Sputum culture at that time grew pseudomonas sensitive to fluoroquinolones. He felt better after the levaquin. Prior to that he was treated with zpak then doxycycline. He continues to have dry cough in the morning and then it can be productive. He will wake up at night with dry cough. He is using duoneb nebulizer treatments twice daily.  OV 10/23/21 He was started on augmentin on 12/1 for concern of tracheitis as he was having thick secretions and shortness of breath. He reports his symptoms are much improved since starting the antibiotic and using saline nebulizer treatments. He feels he has more shortness of breath in the Winter time.  He has follow up with ENT at [facility] in the near future.   OV 02/05/21 He has been doing well since last visit. He is using saline nebulizer treatments for pulmonary hygiene and mucous clearance. His breathing is doing well at this time and he has not had any further hemoptysis. He has follow up soon with ENT for his cancer surveillance.   His lung cancer screening CT was denied by his insurance.   He is not using nocturnal oxygen as the compressor was too loud.  OV 12/01/20: He was hospitalized 10/29/20 to 11/01/20 and underwent bronchoscopy which was notable  for friable bronchial mucosa throughout secondary tot he influenza infection. The hemoptysis has since resolved along with his shortness of breath. He reports he has stopped wearing supplemental oxygen at home. On ambulatory oxygen monitoring today he saturated 99% on room air at rest and 94% on room air at the end of 3 laps.   He has been prescribed budeonisde, brovana and yupelri nebulizer treatments at last follow up visit. These medications have been hard to obtain due to the cost. He has been using yupelri as he was provided samples of this. He has not noticed much of a difference in his breathing with these nebulizer treatments.  He was previously receiving low dose CT chest scans for lung cancer screening. Last CT chest scan was in 2019 at [facility]. No suspicious nodules noted at that time.    Past Medical History:  Diagnosis Date   Arthritis    Cancer ([facility])    throat cancer   GERD (gastroesophageal reflux disease)    OTC meds   Gout    Hemidiaphragm paralysis    History of radiation therapy 05/26/2017- 07/02/2017   Larynx 63 Gy in 28 fractions.    Hypercholesteremia    Hypertension    Kidney stones    Primary localized osteoarthritis of right knee 12/09/2017   Sleep apnea    uses CPAP nightly     Family History  Problem Relation Age of Onset   Lung cancer Father  Passed at the age of 78     Social History   Socioeconomic History   Marital status: Married    Spouse name: Not on file   Number of children: Not on file   Years of education: Not on file   Highest education level: Not on file  Occupational History   Not on file  Tobacco Use   Smoking status: Former    Packs/day: 1.00    Years: 30.00    Total pack years: 30.00    Types: Cigarettes, Cigars    Quit date: 69    Years since quitting: 32.0   Smokeless tobacco: Never   Tobacco comments:    Smoked cigars until 2013  Vaping Use   Vaping Use: Never used  Substance and Sexual Activity   Alcohol use: Yes     Comment: occ   Drug use: No   Sexual activity: Not on file  Other Topics Concern   Not on file  Social History Narrative   Not on file   Social Determinants of Health   Financial Resource Strain: Not on file  Food Insecurity: Not on file  Transportation Needs: Not on file  Physical Activity: Not on file  Stress: Not on file  Social Connections: Not on file  Intimate Partner Violence: Not on file     No Known Allergies   Outpatient Medications Prior to Visit  Medication Sig Dispense Refill   allopurinol (ZYLOPRIM) 100 MG tablet Take 200 mg by mouth daily.     budesonide (PULMICORT) 0.5 MG/2ML nebulizer solution Take 2 mLs (0.5 mg total) by nebulization 2 (two) times daily. 140 mL 6   carvedilol (COREG) 25 MG tablet Take 25 mg by mouth 2 (two) times daily with a meal.     folic acid (FOLVITE) 1 MG tablet Take 1 mg by mouth daily.     ipratropium-albuterol (DUONEB) 0.5-2.5 (3) MG/3ML SOLN Take 79mL by nebulization every 4-6 hours as needed. 360 mL 5   MAGNESIUM-OXIDE 400 (240 Mg) MG tablet      omeprazole (PRILOSEC OTC) 20 MG tablet Take 20 mg by mouth daily.     telmisartan (MICARDIS) 80 MG tablet Take 80 mg by mouth daily.     No facility-administered medications prior to visit.    Review of Systems  Constitutional:  Negative for chills, fever, malaise/fatigue and weight loss.  HENT:  Negative for congestion, sinus pain and sore throat.   Eyes: Negative.   Respiratory:  Positive for cough and sputum production. Negative for hemoptysis, shortness of breath and wheezing.   Cardiovascular:  Negative for chest pain, palpitations, orthopnea, claudication, leg swelling and PND.  Gastrointestinal:  Negative for abdominal pain, heartburn, nausea and vomiting.  Genitourinary: Negative.   Musculoskeletal: Negative.   Neurological: Negative.   Endo/Heme/Allergies: Negative.   Psychiatric/Behavioral: Negative.      Objective:   Vitals:   12/05/22 1049  BP: 118/72  Pulse:  65  SpO2: 97%  Weight: 195 lb (88.5 kg)  Height: 5\' 5"  (1.651 m)   Physical Exam Constitutional:      General: He is not in acute distress.    Appearance: Normal appearance. He is obese. He is not ill-appearing.  HENT:     Head: Normocephalic and atraumatic.  Neck:     Comments: Tracheostomy in place Cardiovascular:     Rate and Rhythm: Normal rate and regular rhythm.     Pulses: Normal pulses.     Heart sounds: Normal heart sounds. No  murmur heard. Pulmonary:     Effort: Pulmonary effort is normal.  Musculoskeletal:     Right lower leg: No edema.     Left lower leg: No edema.  Skin:    General: Skin is warm and dry.  Neurological:     General: No focal deficit present.     Mental Status: He is alert.    CBC    Component Value Date/Time   WBC 14.5 (H) 11/01/2020 0207   RBC 4.75 11/01/2020 0207   HGB 14.5 11/01/2020 0207   HGB 14.9 10/31/2017 0926   HCT 44.4 11/01/2020 0207   HCT 45.7 10/31/2017 0926   PLT 216 11/01/2020 0207   PLT 145 10/31/2017 0926   MCV 93.5 11/01/2020 0207   MCV 93.5 10/31/2017 0926   MCH 30.5 11/01/2020 0207   MCHC 32.7 11/01/2020 0207   RDW 13.2 11/01/2020 0207   RDW 13.5 10/31/2017 0926   LYMPHSABS 0.7 11/01/2020 0207   LYMPHSABS 1.8 10/31/2017 0926   MONOABS 0.5 11/01/2020 0207   MONOABS 0.7 10/31/2017 0926   EOSABS 0.0 11/01/2020 0207   EOSABS 0.2 10/31/2017 0926   BASOSABS 0.0 11/01/2020 0207   BASOSABS 0.0 10/31/2017 0926      Latest Ref Rng & Units 11/01/2020    2:07 AM 10/31/2020    2:14 AM 10/30/2020    9:36 AM  BMP  Glucose 70 - 99 mg/dL 155  178  144   BUN 8 - 23 mg/dL 27  28  23    Creatinine 0.61 - 1.24 mg/dL 0.76  0.78  0.86   Sodium 135 - 145 mmol/L 141  141  141   Potassium 3.5 - 5.1 mmol/L 4.2  4.2  3.5   Chloride 98 - 111 mmol/L 105  105  102   CO2 22 - 32 mmol/L 25  24  25    Calcium 8.9 - 10.3 mg/dL 8.6  8.8  8.6    Chest imaging: CXR 10/24/22 Unchanged elevation of left hemidiaphragm with adjacent  compressive atelectasis or scarring. No new abnormality.  CXR 08/30/22 Redemonstrated elevation of the left hemidiaphragm with some associated opacities, most likely atelectasis, although a superimposed infectious process cannot be excluded.  CXR 11/01/2020 Stable asymmetric elevation left hemidiaphragm. Collapse/consolidative opacity noted adjacent to the elevated diaphragm. Tiny left upper lobe pulmonary nodule, seen to be calcified granuloma on lung cancer screening CT 10/30/2017. Probable minimal atelectasis at the right base. The cardio pericardial silhouette is enlarged. There is pulmonary vascular congestion without overt pulmonary edema. Bones are diffusely demineralized  CT Chest 10/30/2017 Mediastinum/Nodes: No mediastinal lymphadenopathy. No evidence for gross hilar lymphadenopathy although assessment is limited by the lack of intravenous contrast on today's study. The esophagus has normal imaging features. There is no axillary lymphadenopathy.   Lungs/Pleura: Scattered tiny bilateral pulmonary nodules are again identified, many of which are densely calcified consistent with granulomata. No suspicious pulmonary nodule or mass. No focal airspace consolidation. No pulmonary edema or pleural effusion.  PFT:     No data to display            Assessment & Plan:   Chronic obstructive pulmonary disease, unspecified COPD type ([place]) - Plan: sodium chloride HYPERTONIC 3 % nebulizer solution  H/O tracheostomy - Plan: sodium chloride HYPERTONIC 3 % nebulizer solution  Discussion: Art Levan is a 72 year old male, former smoker with laryngeal cancer s/p laryngectomy in 2019, GERD, paralyzed hemidiaphragm and pulmonary nodules who returns to pulmonary clinic for follow up.  He is to continue budesonide and duonebs twice daily for reactive airways disease after his covid 19 infection. We will start him on hypertonic saline nebs twice daily to help with mucous clearance.    He is to follow up in 6 months.  Melody Comas, MD [facility] Office: [phone]   Current Outpatient Medications:    allopurinol (ZYLOPRIM) 100 MG tablet, Take 200 mg by mouth daily., Disp: , Rfl:    budesonide (PULMICORT) 0.5 MG/2ML nebulizer solution, Take 2 mLs (0.5 mg total) by nebulization 2 (two) times daily., Disp: 140 mL, Rfl: 6   carvedilol (COREG) 25 MG tablet, Take 25 mg by mouth 2 (two) times daily with a meal., Disp: , Rfl:    folic acid (FOLVITE) 1 MG tablet, Take 1 mg by mouth daily., Disp: , Rfl:    ipratropium-albuterol (DUONEB) 0.5-2.5 (3) MG/3ML SOLN, Take 43mL by nebulization every 4-6 hours as needed., Disp: 360 mL, Rfl: 5   MAGNESIUM-OXIDE 400 (240 Mg) MG tablet, , Disp: , Rfl:    omeprazole (PRILOSEC OTC) 20 MG tablet, Take 20 mg by mouth daily., Disp: , Rfl:    sodium chloride HYPERTONIC 3 % nebulizer solution, Take by nebulization 2 (two) times daily as needed for other or cough (mucous clearance)., Disp: 750 mL, Rfl: 12   telmisartan (MICARDIS) 80 MG tablet, Take 80 mg by mouth daily., Disp: , Rfl:

## 2022-12-05 NOTE — Patient Instructions (Addendum)
Continue budesonide nebulizer treatment twice daily   Continue to use duoneb nebulizer treatments twice daily   Use hypertonic saline nebulizer twice daily as needed for mucous clearance   Follow up in 6 months

## 2023-03-14 ENCOUNTER — Telehealth: Payer: Self-pay | Admitting: Pulmonary Disease

## 2023-03-14 ENCOUNTER — Other Ambulatory Visit: Payer: Self-pay

## 2023-03-14 MED ORDER — DOXYCYCLINE HYCLATE 100 MG PO TABS: 100.0000 mg | ORAL_TABLET | Freq: Two times a day (BID) | ORAL | 0 refills | Status: DC

## 2023-03-14 NOTE — Telephone Encounter (Signed)
Patient with history of COPD and laryngeal cancer status post laryngectomy  Increased cough, congestion, discolored mucus all concerning for COPD exacerbation  Last antibiotic use was Levaquin with history of sputum culture positive for Pseudomonas.  Will treat with doxycycline 100 mg twice daily for 7 days #14 with no refills. Continue on mucociliary clearance with Mucinex and nebulizers.  Continue on maintenance inhaler. If not improving will need an office visit for further evaluation and treatment options and sputum culture   Please contact office for sooner follow up if symptoms do not improve or worsen or seek emergency care

## 2023-03-14 NOTE — Telephone Encounter (Signed)
PT calling to an Antibx. States he has congestion and Dr. Sherene Sires has sent in before.  Pharm: [facility] [address] in [place]

## 2023-03-14 NOTE — Telephone Encounter (Signed)
Patient complains of productive cough with green phlegm, pt states it hurts when he coughs Pt has taken OTC Mucinex with no improvement  Patient is requesting a antibiotic   Tammy please advise since Dr. Francine Graven is off  Pharmacy [facility] in [place]

## 2023-03-14 NOTE — Telephone Encounter (Signed)
Spoke with patient. Advised antibiotic has been sent to pharmacy. Advised patient to call office back for a f/u in no improvement. Patient verbalized understanding. NFN

## 2023-07-02 ENCOUNTER — Ambulatory Visit: Payer: PPO | Admitting: Pulmonary Disease

## 2023-07-02 ENCOUNTER — Encounter: Payer: Self-pay | Admitting: Pulmonary Disease

## 2023-07-02 VITALS — BP 112/78 | HR 61 | Temp 97.6°F | Ht 65.5 in | Wt 200.2 lb

## 2023-07-02 DIAGNOSIS — J449 Chronic obstructive pulmonary disease, unspecified: Secondary | ICD-10-CM

## 2023-07-02 NOTE — Patient Instructions (Addendum)
Use budesonide nebulizer solution twice daily   Use duoneb treatment as needed every 4-6 hours as needed.   We will order you a new nebulizer machine and supplies   Follow up in 6 months, call sooner if needed

## 2023-07-02 NOTE — Progress Notes (Signed)
Subjective:   PATIENT ID: Alexander Ramirez: male DOB: Apr 12, 1951, MRN: [id number]  HPI  Chief Complaint  Patient presents with   Follow-up    Breathing is better    Alexander Ramirez is a 72 year old male, former smoker with laryngeal cancer s/p laryngectomy in 2019, Follicular lymphoma s/p chemotherapy 2023, GERD, paralyzed hemidiaphragm and pulmonary nodules who returns to pulmonary clinic for follow up.   His breathing is doing well at this time. He has been using nebulizer treatments as needed. He feels his nebulizer machine is not working effectively as it wil take 30 minutes to complete a treatment.   OV 12/05/22 He is feeling better overall compared to last visit but continues to cough up small amounts of mucous plugs. He is using budesonide and duonebs twice daily.   OV 10/24/22 He was seen in acute visit 10/13 and 10/26 for tracheitis and pneumonia by Rhunette Croft, NP. He was treated with course of levaquin. Sputum culture at that time grew pseudomonas sensitive to fluoroquinolones. He felt better after the levaquin. Prior to that he was treated with zpak then doxycycline. He continues to have dry cough in the morning and then it can be productive. He will wake up at night with dry cough. He is using duoneb nebulizer treatments twice daily.  OV 10/23/21 He was started on augmentin on 12/1 for concern of tracheitis as he was having thick secretions and shortness of breath. He reports his symptoms are much improved since starting the antibiotic and using saline nebulizer treatments. He feels he has more shortness of breath in the Winter time.  He has follow up with ENT at [facility] in the near future.   OV 02/05/21 He has been doing well since last visit. He is using saline nebulizer treatments for pulmonary hygiene and mucous clearance. His breathing is doing well at this time and he has not had any further hemoptysis. He has follow up soon with ENT for his cancer surveillance.   His  lung cancer screening CT was denied by his insurance.   He is not using nocturnal oxygen as the compressor was too loud.  OV 12/01/20: He was hospitalized 10/29/20 to 11/01/20 and underwent bronchoscopy which was notable for friable bronchial mucosa throughout secondary tot he influenza infection. The hemoptysis has since resolved along with his shortness of breath. He reports he has stopped wearing supplemental oxygen at home. On ambulatory oxygen monitoring today he saturated 99% on room air at rest and 94% on room air at the end of 3 laps.   He has been prescribed budeonisde, brovana and yupelri nebulizer treatments at last follow up visit. These medications have been hard to obtain due to the cost. He has been using yupelri as he was provided samples of this. He has not noticed much of a difference in his breathing with these nebulizer treatments.  He was previously receiving low dose CT chest scans for lung cancer screening. Last CT chest scan was in 2019 at [facility]. No suspicious nodules noted at that time.    Past Medical History:  Diagnosis Date   Arthritis    Cancer (HCC)    throat cancer   GERD (gastroesophageal reflux disease)    OTC meds   Gout    Hemidiaphragm paralysis    History of radiation therapy 05/26/2017- 07/02/2017   Larynx 63 Gy in 28 fractions.    Hypercholesteremia    Hypertension    Kidney stones    Primary  localized osteoarthritis of right knee 12/09/2017   Sleep apnea    uses CPAP nightly     Family History  Problem Relation Age of Onset   Lung cancer Father        Passed at the age of 22     Social History   Socioeconomic History   Marital status: Married    Spouse name: Not on file   Number of children: Not on file   Years of education: Not on file   Highest education level: Not on file  Occupational History   Not on file  Tobacco Use   Smoking status: Former    Current packs/day: 0.00    Average packs/day: 1 pack/day for 30.0 years (30.0 ttl  pk-yrs)    Types: Cigarettes, Cigars    Start date: 31    Quit date: 3    Years since quitting: 32.6   Smokeless tobacco: Never   Tobacco comments:    Smoked cigars until 2013  Vaping Use   Vaping status: Never Used  Substance and Sexual Activity   Alcohol use: Yes    Comment: occ   Drug use: No   Sexual activity: Not on file  Other Topics Concern   Not on file  Social History Narrative   Not on file   Social Determinants of Health   Financial Resource Strain: Not on file  Food Insecurity: Not on file  Transportation Needs: Not on file  Physical Activity: Not on file  Stress: Not on file  Social Connections: Not on file  Intimate Partner Violence: Not on file     No Known Allergies   Outpatient Medications Prior to Visit  Medication Sig Dispense Refill   allopurinol (ZYLOPRIM) 100 MG tablet Take 200 mg by mouth daily.     budesonide (PULMICORT) 0.5 MG/2ML nebulizer solution Take 2 mLs (0.5 mg total) by nebulization 2 (two) times daily. 140 mL 6   carvedilol (COREG) 25 MG tablet Take 25 mg by mouth 2 (two) times daily with a meal.     ipratropium-albuterol (DUONEB) 0.5-2.5 (3) MG/3ML SOLN Take 3mL by nebulization every 4-6 hours as needed. 360 mL 5   omeprazole (PRILOSEC OTC) 20 MG tablet Take 20 mg by mouth daily.     sodium chloride HYPERTONIC 3 % nebulizer solution Take by nebulization 2 (two) times daily as needed for other or cough (mucous clearance). 750 mL 12   telmisartan (MICARDIS) 80 MG tablet Take 80 mg by mouth daily.     doxycycline (VIBRA-TABS) 100 MG tablet Take 1 tablet (100 mg total) by mouth 2 (two) times daily. (Patient not taking: Reported on 07/02/2023) 14 tablet 0   folic acid (FOLVITE) 1 MG tablet Take 1 mg by mouth daily. (Patient not taking: Reported on 07/02/2023)     MAGNESIUM-OXIDE 400 (240 Mg) MG tablet  (Patient not taking: Reported on 07/02/2023)     No facility-administered medications prior to visit.    Review of Systems   Constitutional:  Negative for chills, fever, malaise/fatigue and weight loss.  HENT:  Negative for congestion, sinus pain and sore throat.   Eyes: Negative.   Respiratory:  Positive for cough and sputum production. Negative for hemoptysis, shortness of breath and wheezing.   Cardiovascular:  Negative for chest pain, palpitations, orthopnea, claudication, leg swelling and PND.  Gastrointestinal:  Negative for abdominal pain, heartburn, nausea and vomiting.  Genitourinary: Negative.   Musculoskeletal: Negative.   Neurological: Negative.   Endo/Heme/Allergies: Negative.   Psychiatric/Behavioral:  Negative.      Objective:   Vitals:   07/02/23 0919  BP: 112/78  Pulse: 61  Temp: 97.6 F (36.4 C)  TempSrc: Temporal  SpO2: 97%  Weight: 200 lb 3.2 oz (90.8 kg)  Height: 5' 5.5" (1.664 m)    Physical Exam Constitutional:      General: He is not in acute distress.    Appearance: Normal appearance. He is obese. He is not ill-appearing.  HENT:     Head: Normocephalic and atraumatic.  Neck:     Comments: Tracheostomy in place Cardiovascular:     Rate and Rhythm: Normal rate and regular rhythm.     Pulses: Normal pulses.     Heart sounds: Normal heart sounds. No murmur heard. Pulmonary:     Effort: Pulmonary effort is normal.  Musculoskeletal:     Right lower leg: No edema.     Left lower leg: No edema.  Skin:    General: Skin is warm and dry.  Neurological:     General: No focal deficit present.     Mental Status: He is alert.    CBC    Component Value Date/Time   WBC 14.5 (H) 11/01/2020 0207   RBC 4.75 11/01/2020 0207   HGB 14.5 11/01/2020 0207   HGB 14.9 10/31/2017 0926   HCT 44.4 11/01/2020 0207   HCT 45.7 10/31/2017 0926   PLT 216 11/01/2020 0207   PLT 145 10/31/2017 0926   MCV 93.5 11/01/2020 0207   MCV 93.5 10/31/2017 0926   MCH 30.5 11/01/2020 0207   MCHC 32.7 11/01/2020 0207   RDW 13.2 11/01/2020 0207   RDW 13.5 10/31/2017 0926   LYMPHSABS 0.7 11/01/2020  0207   LYMPHSABS 1.8 10/31/2017 0926   MONOABS 0.5 11/01/2020 0207   MONOABS 0.7 10/31/2017 0926   EOSABS 0.0 11/01/2020 0207   EOSABS 0.2 10/31/2017 0926   BASOSABS 0.0 11/01/2020 0207   BASOSABS 0.0 10/31/2017 0926      Latest Ref Rng & Units 11/01/2020    2:07 AM 10/31/2020    2:14 AM 10/30/2020    9:36 AM  BMP  Glucose 70 - 99 mg/dL 161  096  045   BUN 8 - 23 mg/dL 27  28  23    Creatinine 0.61 - 1.24 mg/dL 4.09  8.11  9.14   Sodium 135 - 145 mmol/L 141  141  141   Potassium 3.5 - 5.1 mmol/L 4.2  4.2  3.5   Chloride 98 - 111 mmol/L 105  105  102   CO2 22 - 32 mmol/L 25  24  25    Calcium 8.9 - 10.3 mg/dL 8.6  8.8  8.6    Chest imaging: CXR 10/24/22 Unchanged elevation of left hemidiaphragm with adjacent compressive atelectasis or scarring. No new abnormality.  CXR 08/30/22 Redemonstrated elevation of the left hemidiaphragm with some associated opacities, most likely atelectasis, although a superimposed infectious process cannot be excluded.  CXR 11/01/2020 Stable asymmetric elevation left hemidiaphragm. Collapse/consolidative opacity noted adjacent to the elevated diaphragm. Tiny left upper lobe pulmonary nodule, seen to be calcified granuloma on lung cancer screening CT 10/30/2017. Probable minimal atelectasis at the right base. The cardio pericardial silhouette is enlarged. There is pulmonary vascular congestion without overt pulmonary edema. Bones are diffusely demineralized  CT Chest 10/30/2017 Mediastinum/Nodes: No mediastinal lymphadenopathy. No evidence for gross hilar lymphadenopathy although assessment is limited by the lack of intravenous contrast on today's study. The esophagus has normal imaging features. There is no axillary  lymphadenopathy.   Lungs/Pleura: Scattered tiny bilateral pulmonary nodules are again identified, many of which are densely calcified consistent with granulomata. No suspicious pulmonary nodule or mass. No focal airspace  consolidation. No pulmonary edema or pleural effusion.  PFT:     No data to display            Assessment & Plan:   Chronic obstructive pulmonary disease, unspecified COPD type (HCC) - Plan: Ambulatory Referral for DME, CANCELED: For home use only DME Nebulizer machine  Discussion: Javares Carlise is a 72 year old male, former smoker with laryngeal cancer s/p laryngectomy in 2019, GERD, paralyzed hemidiaphragm and pulmonary nodules who returns to pulmonary clinic for follow up.   He is to continue budesonide nebs twice daily and PRNduonebs. He can use hypertonic saline nebs as needed for thick mucous. Will order new nebulizer machine and supplies.   He is to follow up in 6 months.  Melody Comas, MD [facility] Office: [phone]   Current Outpatient Medications:    allopurinol (ZYLOPRIM) 100 MG tablet, Take 200 mg by mouth daily., Disp: , Rfl:    budesonide (PULMICORT) 0.5 MG/2ML nebulizer solution, Take 2 mLs (0.5 mg total) by nebulization 2 (two) times daily., Disp: 140 mL, Rfl: 6   carvedilol (COREG) 25 MG tablet, Take 25 mg by mouth 2 (two) times daily with a meal., Disp: , Rfl:    ipratropium-albuterol (DUONEB) 0.5-2.5 (3) MG/3ML SOLN, Take 3mL by nebulization every 4-6 hours as needed., Disp: 360 mL, Rfl: 5   omeprazole (PRILOSEC OTC) 20 MG tablet, Take 20 mg by mouth daily., Disp: , Rfl:    sodium chloride HYPERTONIC 3 % nebulizer solution, Take by nebulization 2 (two) times daily as needed for other or cough (mucous clearance)., Disp: 750 mL, Rfl: 12   telmisartan (MICARDIS) 80 MG tablet, Take 80 mg by mouth daily., Disp: , Rfl:    doxycycline (VIBRA-TABS) 100 MG tablet, Take 1 tablet (100 mg total) by mouth 2 (two) times daily. (Patient not taking: Reported on 07/02/2023), Disp: 14 tablet, Rfl: 0   folic acid (FOLVITE) 1 MG tablet, Take 1 mg by mouth daily. (Patient not taking: Reported on 07/02/2023), Disp: , Rfl:    MAGNESIUM-OXIDE 400 (240 Mg)  MG tablet, , Disp: , Rfl:

## 2023-07-05 ENCOUNTER — Encounter: Payer: Self-pay | Admitting: Pulmonary Disease

## 2023-10-28 ENCOUNTER — Telehealth: Payer: Self-pay | Admitting: Pulmonary Disease

## 2023-10-28 DIAGNOSIS — J0410 Acute tracheitis without obstruction: Secondary | ICD-10-CM

## 2023-10-28 MED ORDER — DOXYCYCLINE HYCLATE 100 MG PO TABS: 100.0000 mg | ORAL_TABLET | Freq: Two times a day (BID) | ORAL | 0 refills | Status: AC

## 2023-10-28 NOTE — Telephone Encounter (Signed)
Pt wife called in (Verified per DPR) to get patient something sent in for his cough and green mucus

## 2023-10-28 NOTE — Telephone Encounter (Signed)
Spoke with patient, will send in 10 days of doxycycline twice daily.   Melody Comas, MD [facility] Office: [phone]

## 2023-10-28 NOTE — Telephone Encounter (Signed)
[facility] in [place] on [address]  Pt has had voice box removed so he breaths thru his neck. He is getting some green mucous and may need an antibx. He does not recall what was given to him the last time but it worked well.   He need something called in right away so it does not get out of hand. (Today or Tomorrow)   His CB number is [phone]

## 2023-11-28 ENCOUNTER — Telehealth: Payer: Self-pay | Admitting: Pulmonary Disease

## 2023-11-28 DIAGNOSIS — J0410 Acute tracheitis without obstruction: Secondary | ICD-10-CM

## 2023-11-28 MED ORDER — CIPROFLOXACIN HCL 500 MG PO TABS: 500.0000 mg | ORAL_TABLET | Freq: Two times a day (BID) | ORAL | 0 refills | Status: AC

## 2023-11-28 NOTE — Telephone Encounter (Signed)
 I have notified the patient. Nothing further needed.

## 2023-11-28 NOTE — Telephone Encounter (Signed)
 I have sent in ciprofloxacin 500mg  1 tab twice daily for 10 days.  Please let patient know, thanks.  JD

## 2023-11-28 NOTE — Telephone Encounter (Signed)
 Please see last signed encounter. PT would like another round of antibx. States e is still not well and his mucous has turned green all of a sudden  Sending to Dr. Kara in the interest of time since Triage very busy.   Dr.'s last msg reads as follows:  Spoke with patient, will send in 10 days of doxycycline  twice daily.    Dorn Kara, MD [facility] Office: [phone]    Pharm is [facility]

## 2024-10-22 ENCOUNTER — Other Ambulatory Visit: Payer: Self-pay | Admitting: Nurse Practitioner

## 2024-10-22 ENCOUNTER — Other Ambulatory Visit: Payer: Self-pay | Admitting: Pulmonary Disease

## 2024-10-22 DIAGNOSIS — J0410 Acute tracheitis without obstruction: Secondary | ICD-10-CM

## 2024-10-22 DIAGNOSIS — R053 Chronic cough: Secondary | ICD-10-CM

## 2024-12-02 ENCOUNTER — Ambulatory Visit: Payer: Self-pay | Admitting: Pulmonary Disease

## 2024-12-02 NOTE — Telephone Encounter (Signed)
 Pt not seen since 2024.   I called and spoke to pt. Pt confirmed he did have chest tightness and a cough and would like an antibiotic. I informed pt that because we have not seen him in over a year, he would need to come in for an appt. Pt is scheduled for next Wednesday with Almarie Ferrari, NP as Dr Kara did not have sooner availability. Pt states he [facility] and [facility] and will keep his appt unless anything changes. NFN

## 2024-12-02 NOTE — Telephone Encounter (Signed)
 FYI Only or Action Required?: Action required by provider: clinical question for provider and update on patient condition.  Patient is followed in Pulmonology for COPD, last seen on 07/02/2023 by Kara Dorn NOVAK, MD.  Called Nurse Triage reporting Cough.  Symptoms began several days ago.  Interventions attempted: Increased fluids/rest.  Symptoms are: gradually worsening.  Triage Disposition: See Physician Within 24 Hours  Patient/caregiver understands and will follow disposition?: No, wishes to speak with PCP       Copied from CRM #[id number]. Topic: Clinical - Red Word Triage >> Dec 02, 2024  8:39 AM Joesph PARAS wrote: Red Word that prompted transfer to Nurse Triage: Patient states that sputum is green and would like an antibiotic. Reports chest tightness due to thickness of mucus. Reason for Disposition  [1] Continuous (nonstop) coughing interferes with work or school AND [2] no improvement using cough treatment per Care Advice  Answer Assessment - Initial Assessment Questions This RN recommended pt be examined in next 24 hours, pt refusing disposition, [facility] [address] [facility] [place]. Advised pt call back or seek immediate care if any new or worsening symptoms. Sending message to pulm with request.     2. SEVERITY: How bad is the cough today?      Getting worse but not that bad yet  3. SPUTUM: Describe the color of your sputum (e.g., none, dry cough; clear, white, yellow, green)     Thick green sputum  4. HEMOPTYSIS: Are you coughing up any blood? If Yes, ask: How much? (e.g., flecks, streaks, tablespoons, etc.)     Not that bad yet, no blood  5. DIFFICULTY BREATHING: Are you having difficulty breathing? If Yes, ask: How bad is it? (e.g., mild, moderate, severe)      Denies more difficulty than usual  6. FEVER: Do you have a fever? If Yes, ask: What is your temperature, how was it measured, and when did it start?      Denies  7. CARDIAC HISTORY: Do you have any history of heart disease? (e.g., heart attack, congestive heart failure)      Significant  8. LUNG HISTORY: Do you have any history of lung disease?  (e.g., pulmonary embolus, asthma, emphysema)     Significant  9. PE RISK FACTORS: Do you have a history of blood clots? (or: recent major surgery, recent prolonged travel, bedridden)     No per pt chart  10. OTHER SYMPTOMS: Do you have any other symptoms? (e.g., runny nose, wheezing, chest pain)       Chest discomfort very tight only when coughing  Denies: Feeling out of it Stridor Wheezing Fever Chest pain when not coughing  Protocols used: Cough - Acute Productive-A-AH

## 2024-12-08 ENCOUNTER — Ambulatory Visit: Admitting: Primary Care
